# Patient Record
Sex: Female | Born: 1965 | Race: White | Hispanic: No | Marital: Married | Smoking: Never smoker
Health system: Southern US, Community
[De-identification: ages and names within clinical notes are randomized; demographics above are authoritative.]

## PROBLEM LIST (undated history)

## (undated) DIAGNOSIS — M4310 Spondylolisthesis, site unspecified: Secondary | ICD-10-CM

## (undated) DIAGNOSIS — R569 Unspecified convulsions: Secondary | ICD-10-CM

## (undated) DIAGNOSIS — T8859XA Other complications of anesthesia, initial encounter: Secondary | ICD-10-CM

## (undated) DIAGNOSIS — N6310 Unspecified lump in the right breast, unspecified quadrant: Secondary | ICD-10-CM

## (undated) DIAGNOSIS — T4145XA Adverse effect of unspecified anesthetic, initial encounter: Secondary | ICD-10-CM

## (undated) DIAGNOSIS — K219 Gastro-esophageal reflux disease without esophagitis: Secondary | ICD-10-CM

## (undated) DIAGNOSIS — T7840XA Allergy, unspecified, initial encounter: Secondary | ICD-10-CM

## (undated) HISTORY — DX: Spondylolisthesis, site unspecified: M43.10

## (undated) HISTORY — PX: EYE SURGERY: SHX253

## (undated) HISTORY — PX: ABDOMINAL SACROCOLPOPEXY: SHX1114

## (undated) HISTORY — DX: Allergy, unspecified, initial encounter: T78.40XA

## (undated) HISTORY — PX: URETER SURGERY: SHX823

## (undated) HISTORY — DX: Unspecified convulsions: R56.9

---

## 1984-07-16 HISTORY — PX: BUNIONECTOMY: SHX129

## 1998-03-04 ENCOUNTER — Other Ambulatory Visit: Admission: RE | Admit: 1998-03-04 | Discharge: 1998-03-04 | Payer: Self-pay | Admitting: *Deleted

## 1999-02-27 ENCOUNTER — Other Ambulatory Visit: Admission: RE | Admit: 1999-02-27 | Discharge: 1999-02-27 | Payer: Self-pay | Admitting: *Deleted

## 2000-04-25 ENCOUNTER — Other Ambulatory Visit: Admission: RE | Admit: 2000-04-25 | Discharge: 2000-04-25 | Payer: Self-pay | Admitting: Obstetrics and Gynecology

## 2000-05-14 ENCOUNTER — Encounter: Payer: Self-pay | Admitting: Obstetrics and Gynecology

## 2000-05-14 ENCOUNTER — Encounter: Admission: RE | Admit: 2000-05-14 | Discharge: 2000-05-14 | Payer: Self-pay | Admitting: Gynecology

## 2001-04-28 ENCOUNTER — Other Ambulatory Visit: Admission: RE | Admit: 2001-04-28 | Discharge: 2001-04-28 | Payer: Self-pay | Admitting: Obstetrics and Gynecology

## 2002-05-07 ENCOUNTER — Other Ambulatory Visit: Admission: RE | Admit: 2002-05-07 | Discharge: 2002-05-07 | Payer: Self-pay | Admitting: Obstetrics and Gynecology

## 2002-09-04 ENCOUNTER — Encounter: Payer: Self-pay | Admitting: Family Medicine

## 2002-09-04 ENCOUNTER — Encounter: Admission: RE | Admit: 2002-09-04 | Discharge: 2002-09-04 | Payer: Self-pay | Admitting: Family Medicine

## 2003-05-02 ENCOUNTER — Inpatient Hospital Stay (HOSPITAL_COMMUNITY): Admission: AD | Admit: 2003-05-02 | Discharge: 2003-05-04 | Payer: Self-pay | Admitting: Obstetrics and Gynecology

## 2003-06-14 ENCOUNTER — Other Ambulatory Visit: Admission: RE | Admit: 2003-06-14 | Discharge: 2003-06-14 | Payer: Self-pay | Admitting: Obstetrics and Gynecology

## 2003-07-30 ENCOUNTER — Observation Stay (HOSPITAL_COMMUNITY): Admission: RE | Admit: 2003-07-30 | Discharge: 2003-07-31 | Payer: Self-pay | Admitting: Obstetrics and Gynecology

## 2003-07-30 ENCOUNTER — Encounter (INDEPENDENT_AMBULATORY_CARE_PROVIDER_SITE_OTHER): Payer: Self-pay | Admitting: Specialist

## 2004-07-16 HISTORY — PX: ABDOMINAL HYSTERECTOMY: SHX81

## 2007-05-15 ENCOUNTER — Encounter: Payer: Self-pay | Admitting: Sports Medicine

## 2007-07-16 ENCOUNTER — Encounter: Admission: RE | Admit: 2007-07-16 | Discharge: 2007-07-16 | Payer: Self-pay | Admitting: Obstetrics and Gynecology

## 2007-07-28 ENCOUNTER — Encounter (INDEPENDENT_AMBULATORY_CARE_PROVIDER_SITE_OTHER): Payer: Self-pay | Admitting: *Deleted

## 2007-07-30 ENCOUNTER — Ambulatory Visit: Payer: Self-pay | Admitting: Sports Medicine

## 2007-07-30 DIAGNOSIS — M7750 Other enthesopathy of unspecified foot: Secondary | ICD-10-CM | POA: Insufficient documentation

## 2008-07-19 ENCOUNTER — Encounter: Admission: RE | Admit: 2008-07-19 | Discharge: 2008-07-19 | Payer: Self-pay | Admitting: Family Medicine

## 2009-07-25 ENCOUNTER — Encounter: Admission: RE | Admit: 2009-07-25 | Discharge: 2009-07-25 | Payer: Self-pay | Admitting: Obstetrics and Gynecology

## 2010-07-16 HISTORY — PX: ROTATOR CUFF REPAIR: SHX139

## 2010-08-04 ENCOUNTER — Encounter
Admission: RE | Admit: 2010-08-04 | Discharge: 2010-08-04 | Payer: Self-pay | Source: Home / Self Care | Attending: Obstetrics and Gynecology | Admitting: Obstetrics and Gynecology

## 2010-12-01 NOTE — H&P (Signed)
[name], [name]:  [id number]   MEDICAL RECORD NO.:  [id number]                   PATIENT TYPE:  INP   LOCATION:  9163                                 FACILITY:  [facility]   PHYSICIAN:  Lenoard Aden, M.D.             DATE OF BIRTH:  1965-07-25   DATE OF ADMISSION:  05/02/2003  DATE OF DISCHARGE:                                HISTORY & PHYSICAL   CHIEF COMPLAINT:  Spontaneous rupture of membranes at 11:30 p.m. on May 01, 2003, clear fluid.   HISTORY OF PRESENT ILLNESS:  The patient is a 45 year old white female, G1,  P0, estimated date of confinement of May 15, 2003, at 38 plus weeks who  presents with spontaneous rupture of membranes.   ALLERGIES:  SULFA AND LATEX ALLERGY.   MEDICATIONS:  Prenatal vitamins.   MEDICAL HISTORY:  1. 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  2. History of microscopic hematuria.  3. History of bunion removal.  4. History of recurrent urinary tract infections.  5. Remote history of seizure disorder.   FAMILY HISTORY:  Emphysema, smoking abuse, gestational diabetes, neural tube  defects, hypertension, myocardial infarction.   SOCIAL HISTORY:  Noncontributory.   LABORATORY DATA:  Prenatal laboratory data, blood type O positive, Rh  antibody negative, rubella immune, hepatitis and HIV negative.   HISTORY OF PRESENT PREGNANCY:  Her prenatal course was complicated by  advanced  maternal age with an amniocentesis consistent with a normal  female, preterm cervical change which was stable on serial examination.   PHYSICAL EXAMINATION:  GENERAL:  She is a well developed, well nourished  white female in no acute distress.  HEENT:  Normal.  LUNGS:  Clear.  HEART:  Regular rate and rhythm.  ABDOMEN:  Soft, gravid, nontender. Recent ultrasound guided estimate of  approximately 7-1/2 pounds.  PELVIC:   Cervix is 1 cm thick, vertex, -2.  EXTREMITIES:  No cords.  NEUROLOGIC:  Examination is nonfocal.   IMPRESSION:  1. A 38 week intrauterine pregnancy.  2. Spontaneous rupture of membranes at term.  3. History of ureteral reimplantation.  4. Remote history of seizure disorder on early preconceptual folic acid     supplementation.  5. Advanced maternal age with normal amniocentesis.   PLAN:  Proceed with admission. Pitocin augmentation as needed. Fetal heart  rate monitoring per usual protocol. Anticipate attempts at vaginal delivery  due to a narrow pubic arch and questionable borderline pelvimetry. Will  proceed with cautious attempts at operative delivery pending the patient's  ability to push.  Lenoard Aden, M.D.    RJT/MEDQ  D:  05/02/2003  T:  05/02/2003  Job:  [id number]

## 2010-12-01 NOTE — Op Note (Signed)
   NAMEROSAURA, [name]:  [id number]   MEDICAL RECORD NO.:  [id number]                   PATIENT TYPE:  INP   LOCATION:  9104                                 FACILITY:  [facility]   PHYSICIAN:  Lenoard Aden, M.D.             DATE OF BIRTH:  03-26-1966   DATE OF PROCEDURE:  DATE OF DISCHARGE:                                 OPERATIVE REPORT   INDICATION FOR OPERATIVE DELIVERY:  Nonreassuring fetal heart rate status.   POSTOPERATIVE DIAGNOSIS:  Nonreassuring fetal heart rate status.   PROCEDURE:  Outlet vacuum-assisted vaginal delivery.   SURGEON:  Lenoard Aden, M.D.   ANESTHESIA:  Epidural.   ESTIMATED BLOOD LOSS:  600 cc.   COMPLICATIONS:  None.   BRIEF OPERATIVE NOTE:  After being apprised of risks and benefit of vacuum-  assistance including small incidence of cephal hematoma, scalp lacerations,  and/or intracranial hemorrhage, the patient is offered Kiwi cup assistance.  Risks and benefits having been discussed, husband and patient agree to  proceed.  Due to nonreassuring heart rate status, fetal vertex is noted to  be LOA, less than 45 degrees, +3 to +4 station.  Kiwi cup is placed for two  pulls over a first-degree midline laceration and a right lateral vaginal  wall laceration.  Full-term living female, Apgars 8 and 9, cord blood  collected, bulb suctioning performed, spontaneously intact three-vessel cord  noted, cervix without lacerations, no other vaginal lacerations noted.  The  right lateral wall laceration is repaired using a running 3-0 Vicryl repeat.  The small superficial first-degree laceration repaired using a 3-0 repeat.  Good hemostasis is noted.  All counts are correct.  The patient tolerates  the procedure well and is in the recovery room in good condition.                                               Lenoard Aden, M.D.    RJT/MEDQ  D:  05/02/2003  T:  05/02/2003  Job:  [id number]

## 2010-12-01 NOTE — H&P (Signed)
NAME:  [name], [name]                       ACCOUNT NO.:  [id number]   MEDICAL RECORD NO.:  [id number]                   PATIENT TYPE:  OBV   LOCATION:  NA                                   [facility]   PHYSICIAN:  Lenoard Aden, M.D.             DATE OF BIRTH:  Mar 01, 1966   DATE OF ADMISSION:  DATE OF DISCHARGE:                                HISTORY & PHYSICAL   ANTICIPATED DATE OF ADMISSION:  July 30, 2003.   CHIEF COMPLAINT:  Symptomatic pelvic relaxation, rectocele and perineal  relaxation.   HISTORY OF PRESENT ILLNESS:  The patient is a 45 year old white female,  G1,  P1, status post uncomplicated vaginal delivery in October 2004 who presents  with worsening pelvic relaxation and inability to defecate without placing  her ___________ rectum.   MEDICATIONS:  None.   ALLERGIES:  Allergies are to SULFA and LATEX.   PAST MEDICAL HISTORY:  1. The patient has a history of childhood bladder anomaly with the need for     exploratory laparotomy and reimplantation of a ureters.  2. History of microscopic hematuria.  3. History of bunion removal.  4. History of recurrent UTI.  5. History of seizure disorder.   FAMILY HISTORY:  Family history is remarkable for emphysema, smoking abuse,  gestational diabetes, ___________ defects, hypertension, and myocardial  infarction.   SOCIAL HISTORY:  Social history is noncontributory.   PHYSICAL EXAMINATION:  GENERAL APPEARANCE:  On physical exam she is a well-  developed, well-nourished, white female in no acute distress.  HEENT:  Normal.  LUNGS:  Lungs are clear.  HEART:  Regular rhythm.  ABDOMEN:  Abdomen is soft and nontender.  PELVIC EXAMINATION:  Uterus is small and anteflexed.  No adnexal masses are  noted.  There is a perineal defect with perineal relaxation and a Grade 2  rectocele noted.  EXTREMITIES:  There are no cords.  NEUROLOGIC:  Neurologic exam is nonfocal.   IMPRESSION:  1. Symptomatic rectocele.  2.  Perineal relaxation.   PLAN:  The plan is for posterior colporrhaphy and perineorrhaphy.   The risks of anesthesia, infection, bleeding, and injury to abdominal organs  and need for repair were discussed, and delayed versus immediate  complications to include bowel injury were noted.  The patient acknowledges  and wishes to proceed.                                               Lenoard Aden, M.D.    RJT/MEDQ  D:  07/29/2003  T:  07/29/2003  Job:  [id number]

## 2010-12-01 NOTE — Op Note (Signed)
NAME:  [name], [name]                       ACCOUNT NO.:  [id number]   MEDICAL RECORD NO.:  [id number]                   PATIENT TYPE:  OBV   LOCATION:  9312                                 [facility]ACILITY:  [facility]   PHYSICIAN:  Lenoard Aden, M.D.             DATE OF BIRTH:  July 07, 1966   DATE OF PROCEDURE:  07/30/2003  DATE OF DISCHARGE:                                 OPERATIVE REPORT   PREOPERATIVE DIAGNOSES:  1. Symptomatic rectocele.  2. Perineal relaxation.  3. Vulvodynia.   POSTOPERATIVE DIAGNOSES:  1. Symptomatic rectocele.  2. Perineal relaxation.  3. Vulvodynia.  4. Right vaginal wall implant.   PROCEDURES:  1. Posterior colporrhaphy.  2. Perineorrhaphy.  3. Excision of right vaginal wall implant.   SURGEON:  Lenoard Aden, M.D.   ANESTHESIA:  General.   ESTIMATED BLOOD LOSS:  Less than 50 mL.   COMPLICATIONS:  None.   DRAINS:  None.   COUNTS:  Correct.   Patient to recovery in good condition.   BRIEF OPERATIVE NOTE:  After being apprised of the risks of anesthesia,  infection, bleeding, possibly injury to bowel and need for repair, inability  to cure pain, the patient is brought to the operating room, where she was  administered a general anesthetic without complications, prepped and draped  in the usual sterile fashion, and a Foley catheter placed.  After achieving  adequate anesthesia, dilute Pitressin solution placed in the area of the  rectocele repair, perineum, and an identified right vaginal wall implant,  which is causing scarring along the right lateral vaginal wall.  The right  vaginal wall implant is elevated and circumscribed using electrocautery and  removed at the base and sent to pathology.  Full removal of the implant was  noted.  Subcutaneous and vaginal wall tissue was closed using a 3-0 Vicryl.  At this time a perineal incision is made excising an area of skin along the  perineum.  The rectocele is marked with a rectal exam noting  the apex of the  rectocele defect.  It is then circumscribed using electrocautery to define  the vaginal wall mucosa to be removed.  This vaginal wall mucosa is then  undermined using Strully scissors and excised in a rectangular strand of  tissue.  The rectovaginal tissue is then plicated in the midline,  imbricating the defect using a 3-0 Monocryl suture in interrupted fashion  down to the level of the perineum, whereby the perineal relaxation is  corrected, performing a perineorrhaphy.  Deep stitches are placed using a 3-  0 Monocryl and the perineorrhaphy is then closed in the standard fashion  using a 3-0 Vicryl suture.  Rectal exam shows the rectum to be intact and no  invasion of any suture material into the rectal area.  The patient tolerates  the procedure well, is awakened and transferred to recovery in good  condition.  Lenoard Aden, M.D.    RJT/MEDQ  D:  07/30/2003  T:  07/30/2003  Job:  [id number]

## 2011-07-18 ENCOUNTER — Other Ambulatory Visit: Payer: Self-pay | Admitting: Obstetrics and Gynecology

## 2011-07-18 DIAGNOSIS — Z1231 Encounter for screening mammogram for malignant neoplasm of breast: Secondary | ICD-10-CM

## 2011-08-06 ENCOUNTER — Ambulatory Visit
Admission: RE | Admit: 2011-08-06 | Discharge: 2011-08-06 | Disposition: A | Discharge status: Home / Self Care | Payer: BC Managed Care – PPO | Source: Ambulatory Visit | Attending: Obstetrics and Gynecology | Admitting: Obstetrics and Gynecology

## 2011-08-06 DIAGNOSIS — Z1231 Encounter for screening mammogram for malignant neoplasm of breast: Secondary | ICD-10-CM

## 2011-08-07 ENCOUNTER — Other Ambulatory Visit: Payer: Self-pay | Admitting: Obstetrics and Gynecology

## 2011-08-07 DIAGNOSIS — N6311 Unspecified lump in the right breast, upper outer quadrant: Secondary | ICD-10-CM

## 2011-08-15 ENCOUNTER — Ambulatory Visit
Admission: RE | Admit: 2011-08-15 | Discharge: 2011-08-15 | Disposition: A | Discharge status: Home / Self Care | Payer: BC Managed Care – PPO | Source: Ambulatory Visit | Attending: Obstetrics and Gynecology | Admitting: Obstetrics and Gynecology

## 2011-08-15 DIAGNOSIS — N6311 Unspecified lump in the right breast, upper outer quadrant: Secondary | ICD-10-CM

## 2011-11-01 ENCOUNTER — Other Ambulatory Visit: Payer: Self-pay | Admitting: Gastroenterology

## 2011-11-05 ENCOUNTER — Other Ambulatory Visit: Payer: BC Managed Care – PPO

## 2011-12-24 ENCOUNTER — Ambulatory Visit
Admission: RE | Admit: 2011-12-24 | Discharge: 2011-12-24 | Disposition: A | Discharge status: Home / Self Care | Payer: BC Managed Care – PPO | Source: Ambulatory Visit | Attending: Gastroenterology | Admitting: Gastroenterology

## 2011-12-24 IMAGING — US US ABDOMEN COMPLETE
1 series · 14 of 25 positions shown · non-contrast
Comparison: No comparison studies available.

CLINICAL DATA: Abdominal pain

ABDOMEN ULTRASOUND
TECHNIQUE: Complete abdominal ultrasound examination was performed
including evaluation of the liver, gallbladder, bile ducts,
pancreas, kidneys, spleen, IVC, and abdominal aorta.

[Series 1: us abdomen complete · 0.20mm/px · 14 of 75 slices shown]
[im 1/75]
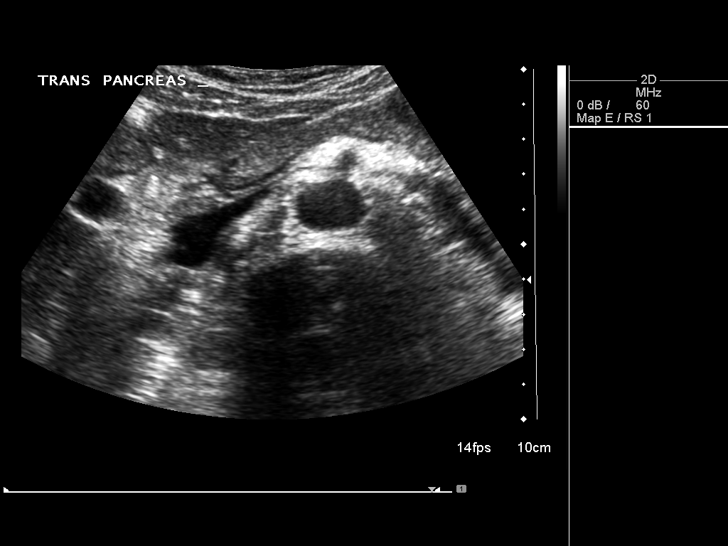
[im 7/75]
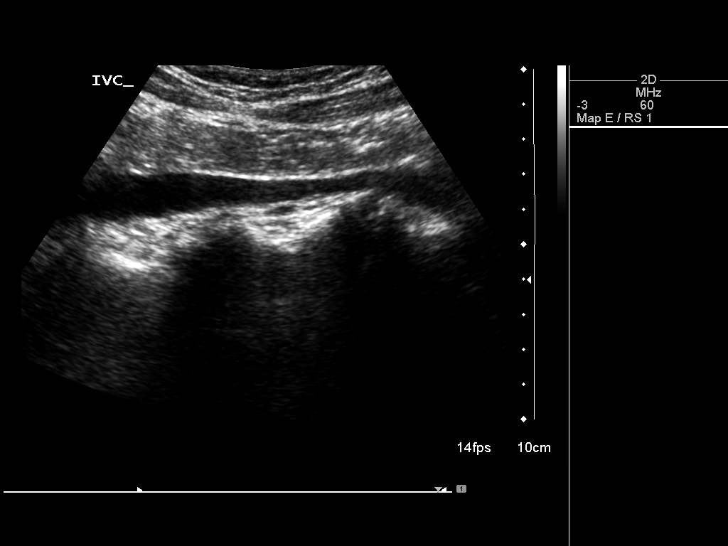
[im 13/75]
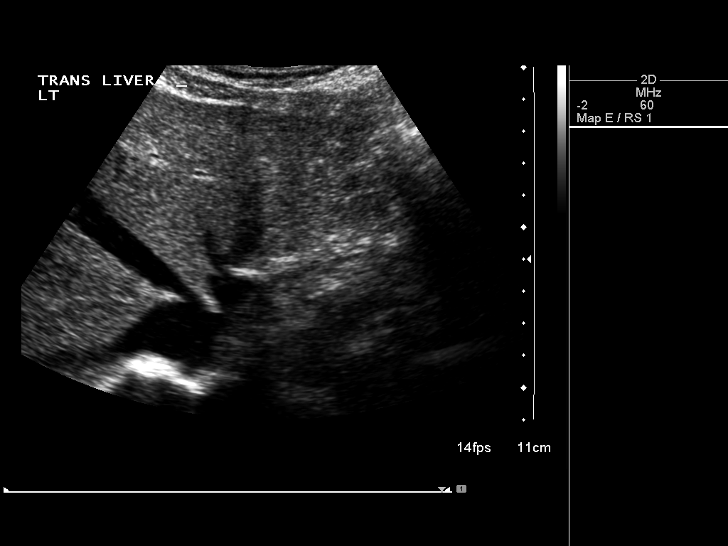
[im 19/75]
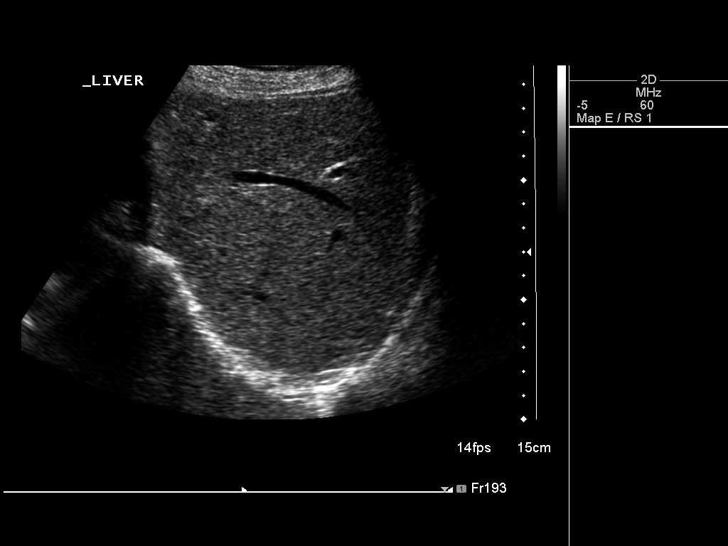
[im 25/75]
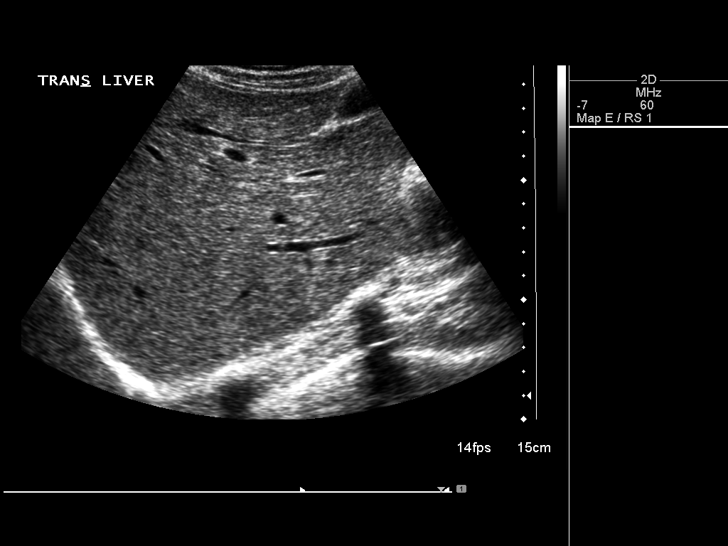
[im 28/75]
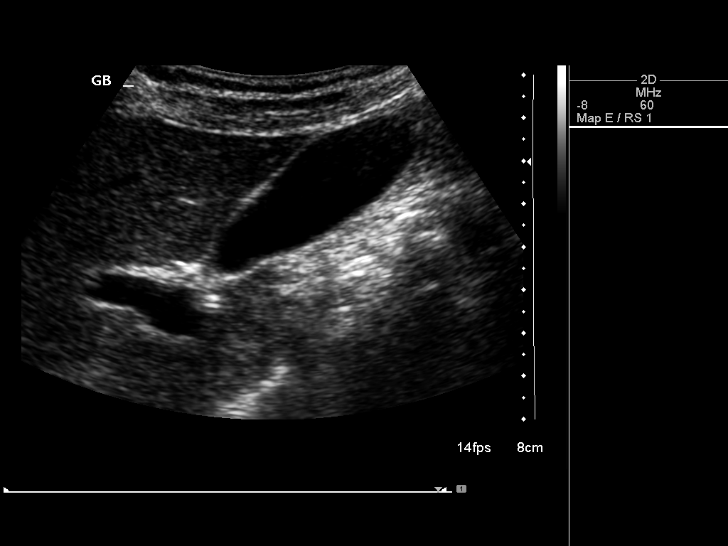
[im 34/75]
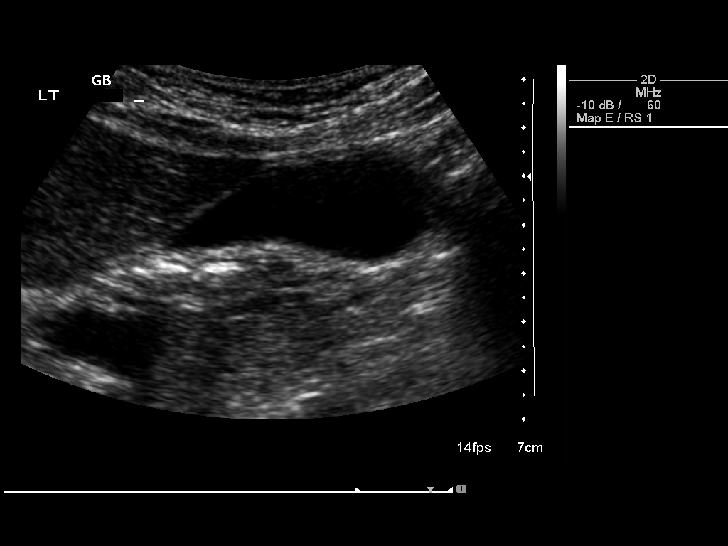
[im 41/75]
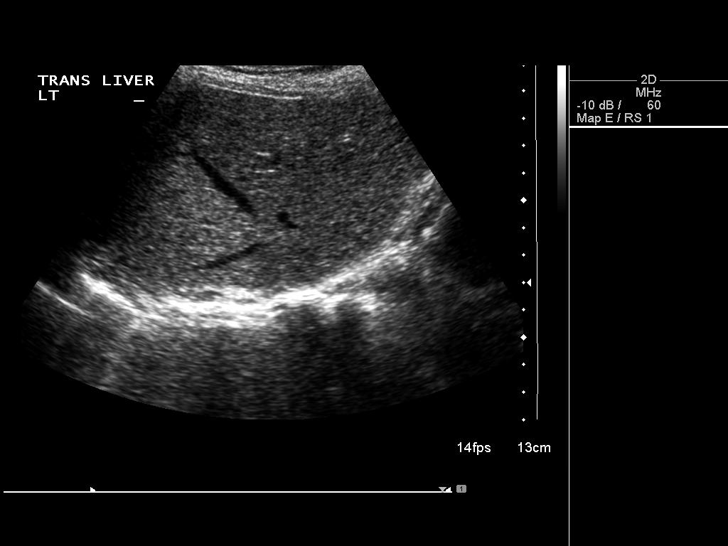
[im 47/75]
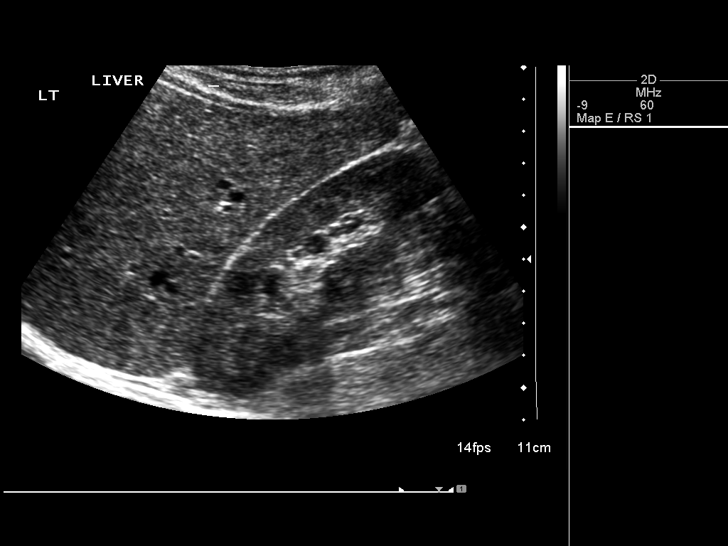
[im 50/75]
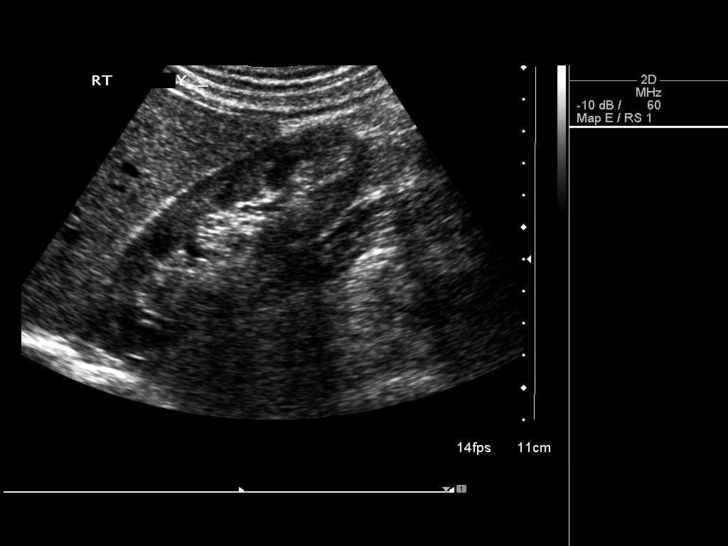
[im 56/75]
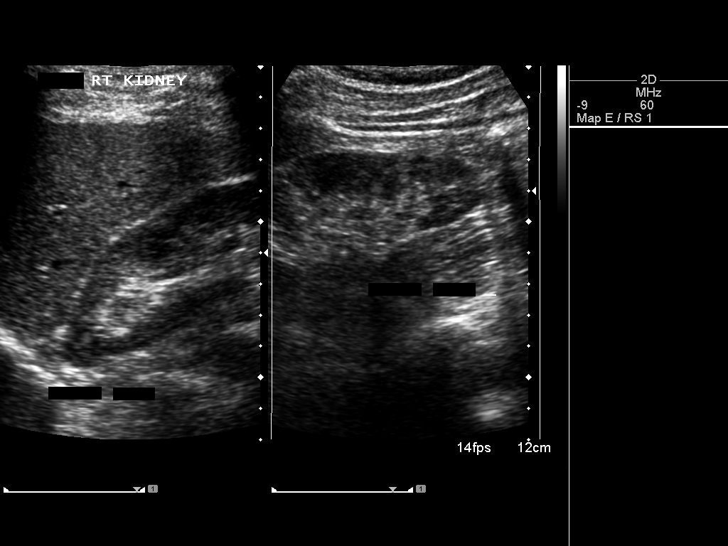
[im 62/75]
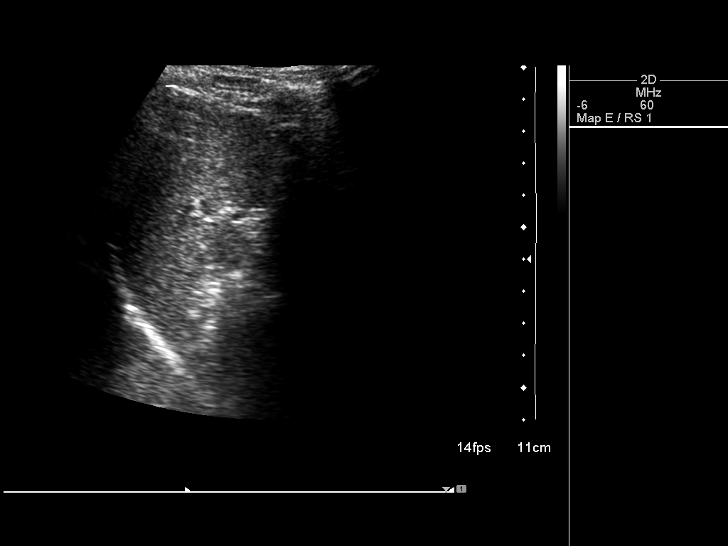
[im 68/75]
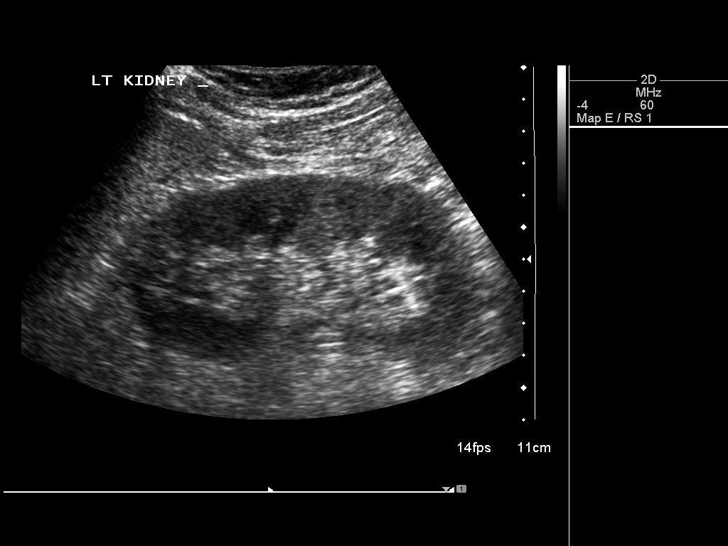
[im 75/75]
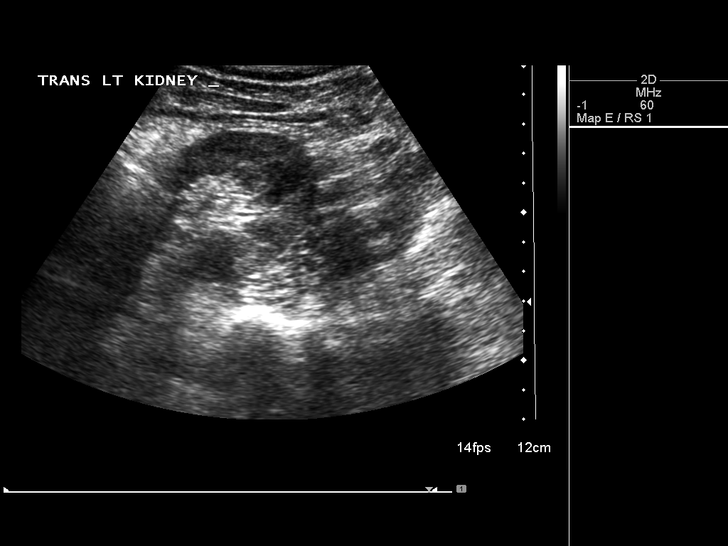

[14 of 25 positions shown; findings below may reference images not displayed]

FINDINGS: Gallbladder:  There is no evidence for gallstones.  No gallbladder
wall thickening or pericholecystic fluid.  The sonographer reports
no sonographic Murphy's sign.

Common Bile Duct:  Nondilated at 3 mm diameter.

Liver:  Tiny hepatic cyst noted.  Otherwise normal in appearance.
Normal echotexture.

IVC:  Normal.

Pancreas:  Normal.

Spleen:  Normal.

Right kidney:  10.8 cm in long axis.  Normal.

Left kidney:  11.2 cm in long axis.  Normal.

Abdominal Aorta:  No aneurysm.
IMPRESSION: Tiny hepatic cyst.  Otherwise normal exam.

## 2012-03-21 ENCOUNTER — Other Ambulatory Visit: Payer: Self-pay | Admitting: Dermatology

## 2012-07-17 ENCOUNTER — Other Ambulatory Visit: Payer: Self-pay | Admitting: Obstetrics and Gynecology

## 2012-07-17 DIAGNOSIS — Z1231 Encounter for screening mammogram for malignant neoplasm of breast: Secondary | ICD-10-CM

## 2012-08-15 ENCOUNTER — Ambulatory Visit
Admission: RE | Admit: 2012-08-15 | Discharge: 2012-08-15 | Disposition: A | Discharge status: Home / Self Care | Payer: 59 | Source: Ambulatory Visit | Attending: Obstetrics and Gynecology | Admitting: Obstetrics and Gynecology

## 2012-08-15 DIAGNOSIS — Z1231 Encounter for screening mammogram for malignant neoplasm of breast: Secondary | ICD-10-CM

## 2012-08-15 IMAGING — MG MM DIGITAL SCREENING BILAT W/ CAD
8 series · 9 of 24 positions shown · non-contrast
Comparison: [DATE]

CLINICAL DATA: Screening.

DIGITAL BILATERAL SCREENING MAMMOGRAM WITH CAD
 DIGITAL BREAST TOMOSYNTHESIS
Digital breast tomosynthesis images are acquired in two
projections.  These images are reviewed in combination with the
digital mammogram, confirming the findings below.

[L CC]
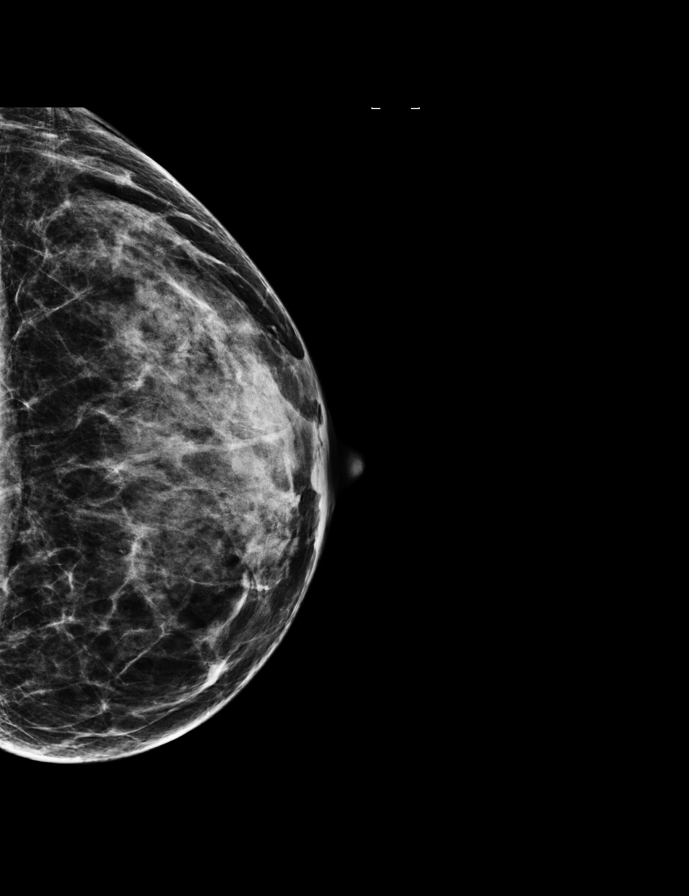

[R CC]
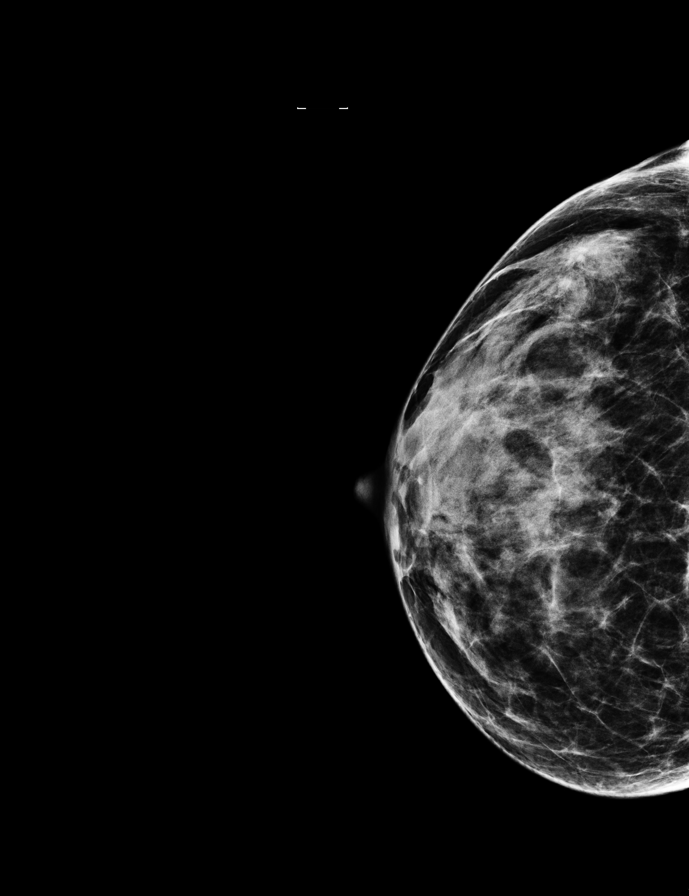

[R MLO]
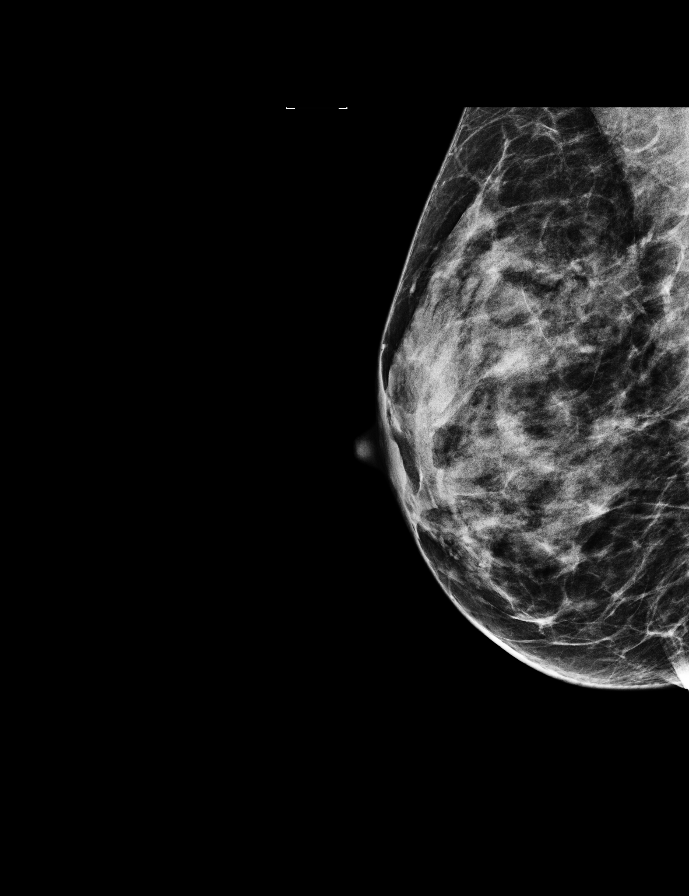

[L MLO]
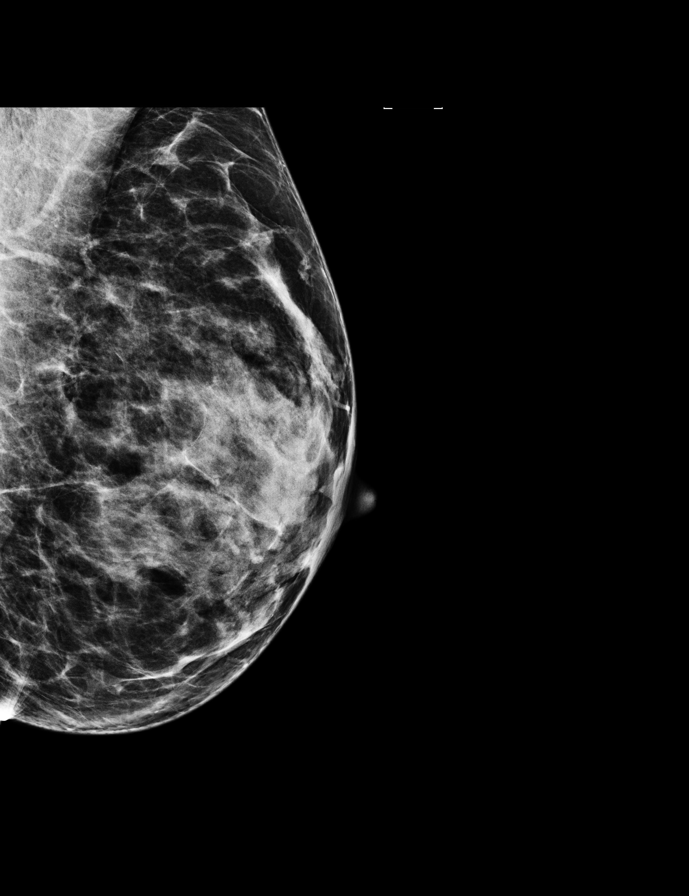

[L MLO tomo · 2 of 51 frames shown]
[frame 17/51]
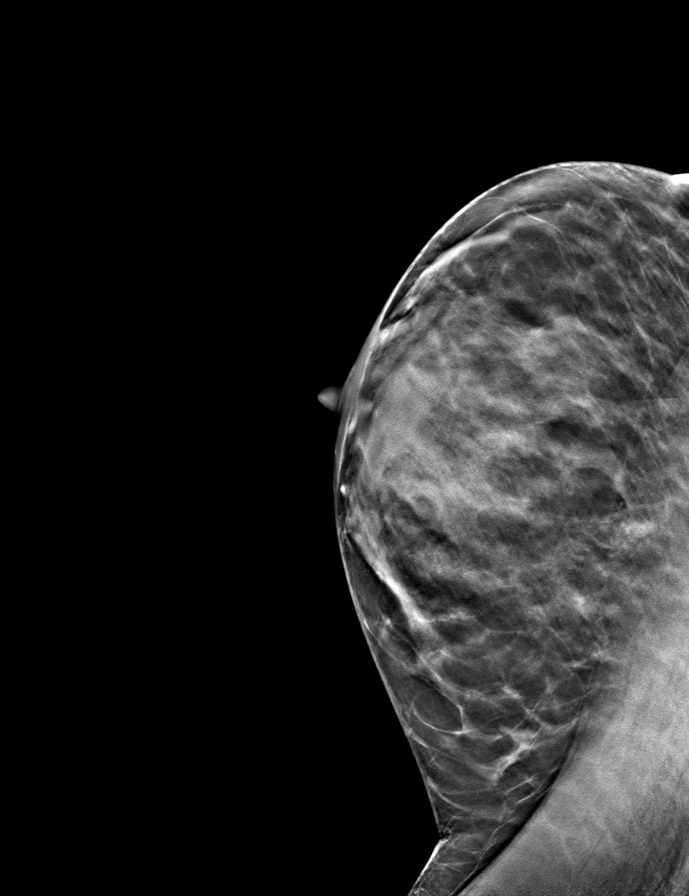
[frame 26/51]
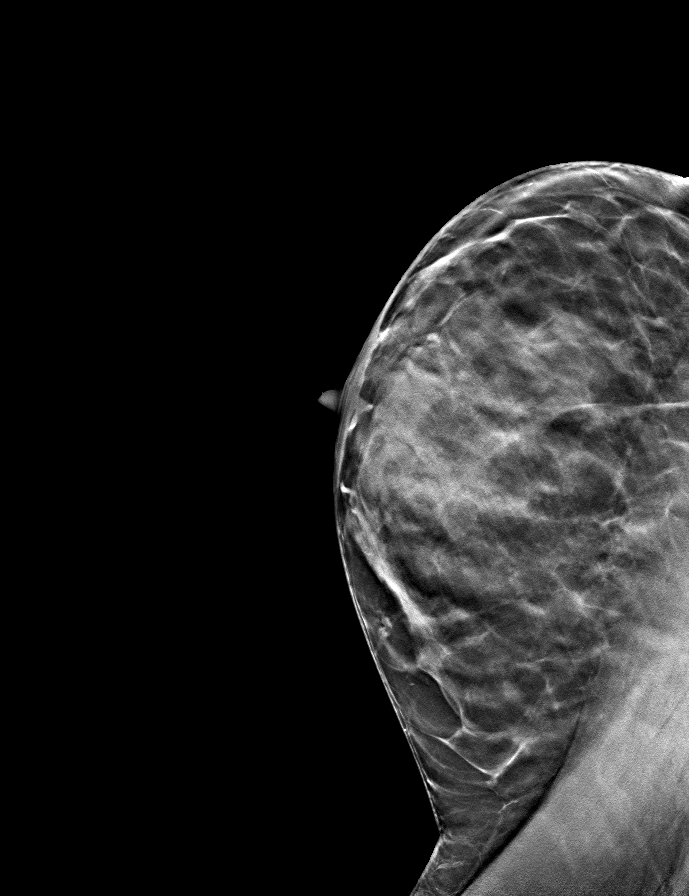

[R MLO tomo · tomo slice 24/47.0]
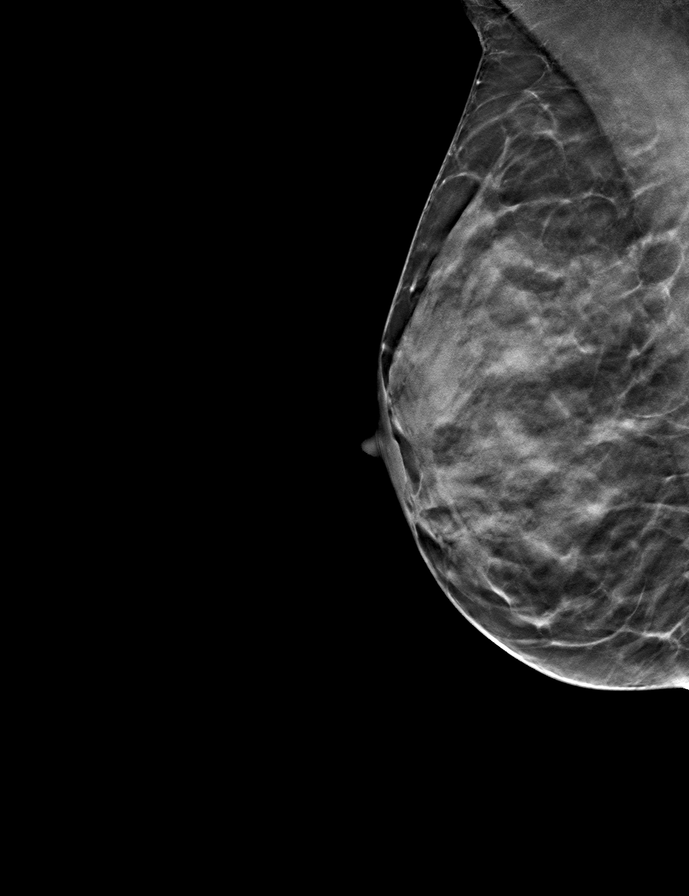

[L CC tomo · tomo slice 27/53.0]
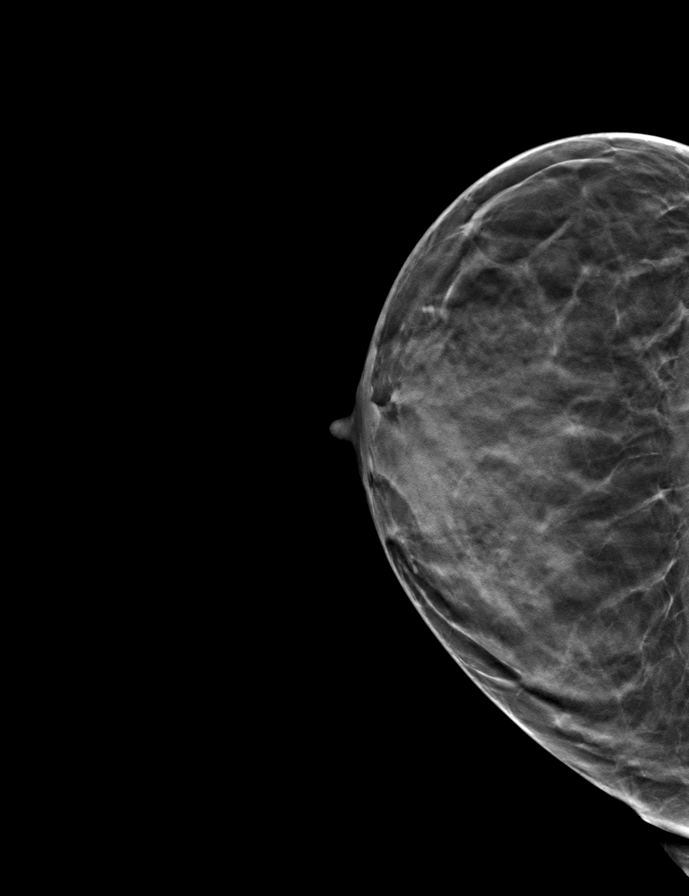

[R CC tomo · tomo slice 25/48.0]
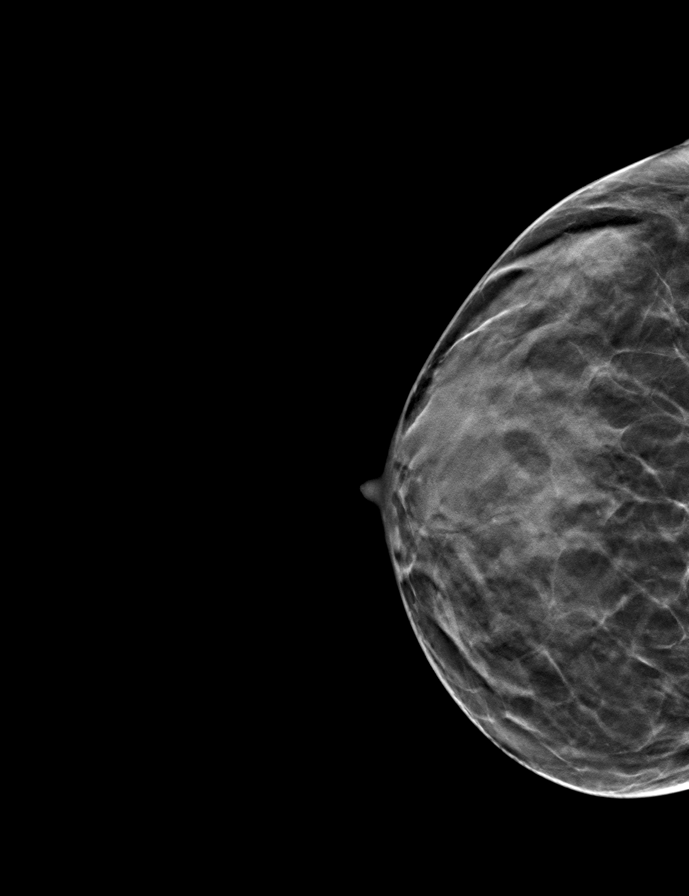

[9 of 24 positions shown; findings below may reference images not displayed]

FINDINGS: ACR Breast Density Category 3: The breast tissue is heterogeneously
dense.

There is a possible mass in the right breast.  Ultrasound is
recommended for further evaluation.  The left breast is
unremarkable.

Images were processed with CAD.
IMPRESSION: Possible mass, right breast. Ultrasound is recommended for further
evaluation.

RECOMMENDATION:

Ultrasound of the right breast. (Code:[DY])

BI-RADS CATEGORY 0:  Incomplete.  Need additional imaging
evaluation and/or prior mammograms for comparison.

## 2012-08-19 ENCOUNTER — Other Ambulatory Visit: Payer: Self-pay | Admitting: Obstetrics and Gynecology

## 2012-08-19 DIAGNOSIS — R928 Other abnormal and inconclusive findings on diagnostic imaging of breast: Secondary | ICD-10-CM

## 2012-08-25 ENCOUNTER — Other Ambulatory Visit: Payer: Self-pay | Admitting: Obstetrics and Gynecology

## 2012-08-25 ENCOUNTER — Ambulatory Visit
Admission: RE | Admit: 2012-08-25 | Discharge: 2012-08-25 | Disposition: A | Discharge status: Home / Self Care | Payer: 59 | Source: Ambulatory Visit | Attending: Obstetrics and Gynecology | Admitting: Obstetrics and Gynecology

## 2012-08-25 DIAGNOSIS — R928 Other abnormal and inconclusive findings on diagnostic imaging of breast: Secondary | ICD-10-CM

## 2012-08-25 IMAGING — US US BREAST*R*
1 series · 4 of 4 positions shown · non-contrast
Comparison: Screening mammogram/tomogram [DATE]

CLINICAL DATA: Possible mass in the outer right breast identified
on recent screening mammogram/tomogram.  The patient reports that a
palpable area in the 9 o'clock position of the right breast,
previously diagnosed as a fibrocystic complex on ultrasound,
remains the same.

DIGITAL DIAGNOSTIC RIGHT MAMMOGRAM WITHOUT CAD AND RIGHT BREAST
ULTRASOUND:

[Series 1: us breast*right* · 4 of 4 slices shown]
[im 1/4]
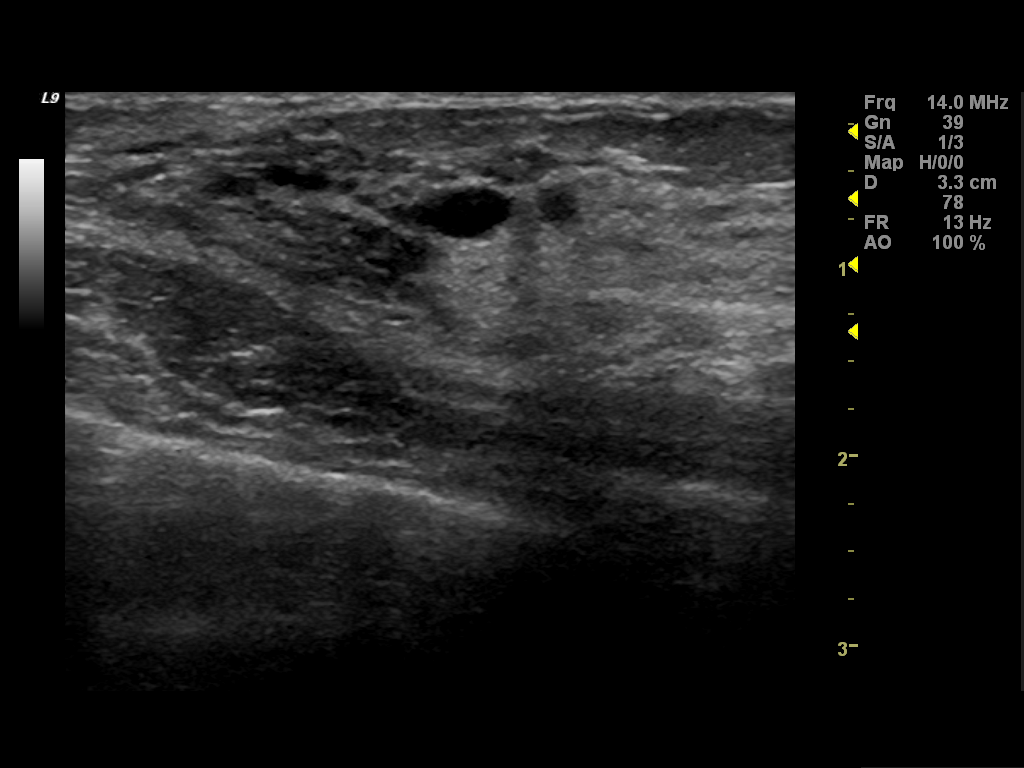
[im 2/4]
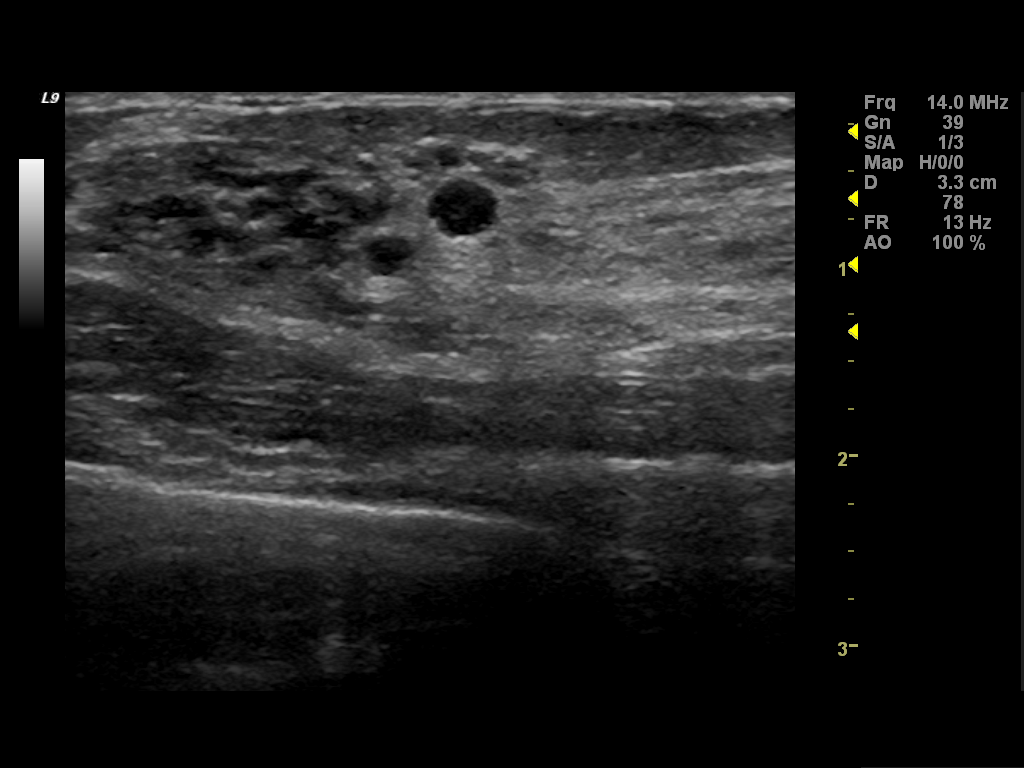
[im 3/4]
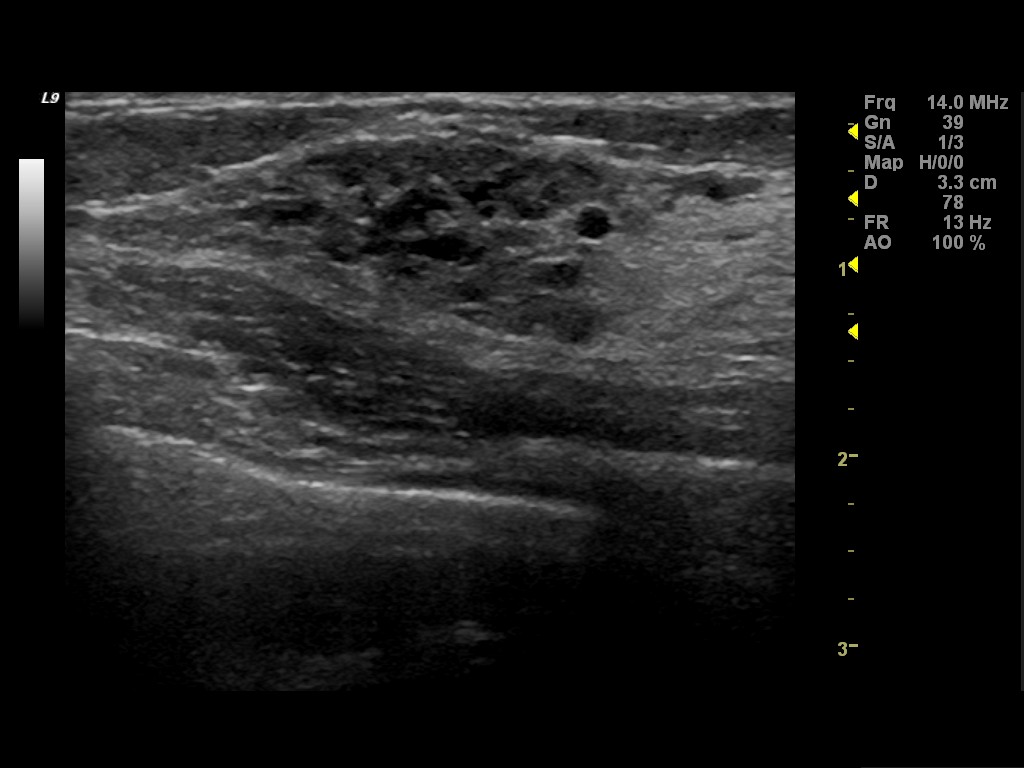
[im 4/4]
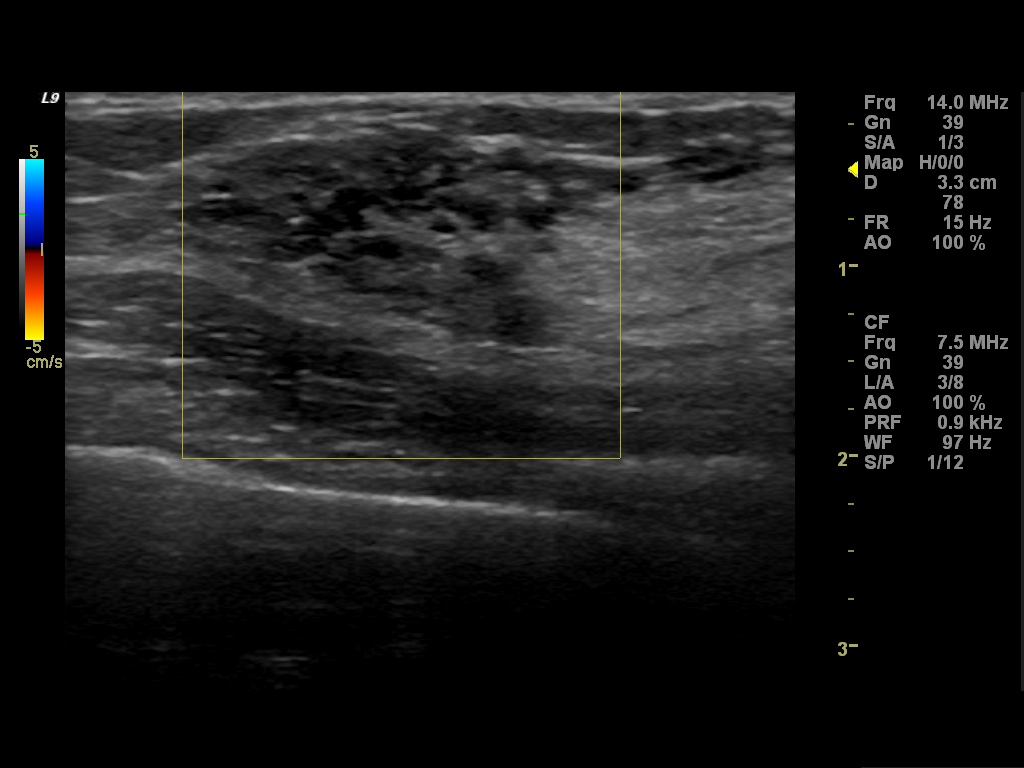

[4 of 4 positions shown; findings below may reference images not displayed]

FINDINGS: ACR Breast Density Category 3: The breast tissue is heterogeneously
dense.

Focal spot compression views of the outer right breast and rolled
CC views of the right breast show heterogeneously dense breast
parenchyma without a persistent mass or distortion.  When compared
to prior mammogram of [DATE], the area of concern on recent
screening mammogram in [DATE] is in a similar location as the
patient's palpable area, previously shown to be fibrocystic
changes.

On physical exam, there is mobile, palpable fullness in the 9
o'clock position of the right breast 5 cm from the nipple.

Ultrasound is performed, showing focal fibrocystic changes at 9
o'clock position 5 cm from the nipple with multiple small cysts.
The appearance is similar to the ultrasound of [DATE].  No
solid mass or abnormal shadowing is identified to suggest
malignancy in the outer right breast.
IMPRESSION: No evidence of malignancy in the right breast.  Stable appearance
of focal fibrocystic changes in the 9 o'clock position.

RECOMMENDATION:
Bilateral screening mammogram in 1 year.

I have discussed the findings and recommendations with the patient.
Results were also provided in writing at the conclusion of the
visit.

BI-RADS CATEGORY 2:  Benign finding(s).

## 2012-08-26 IMAGING — MG MM DIGITAL DIAGNOSTIC LIMITED*R*
4 series · 4 of 4 positions shown · non-contrast
Comparison: Screening mammogram/tomogram [DATE]

CLINICAL DATA: Possible mass in the outer right breast identified
on recent screening mammogram/tomogram.  The patient reports that a
palpable area in the 9 o'clock position of the right breast,
previously diagnosed as a fibrocystic complex on ultrasound,
remains the same.

DIGITAL DIAGNOSTIC RIGHT MAMMOGRAM WITHOUT CAD AND RIGHT BREAST
ULTRASOUND:

[R CC]
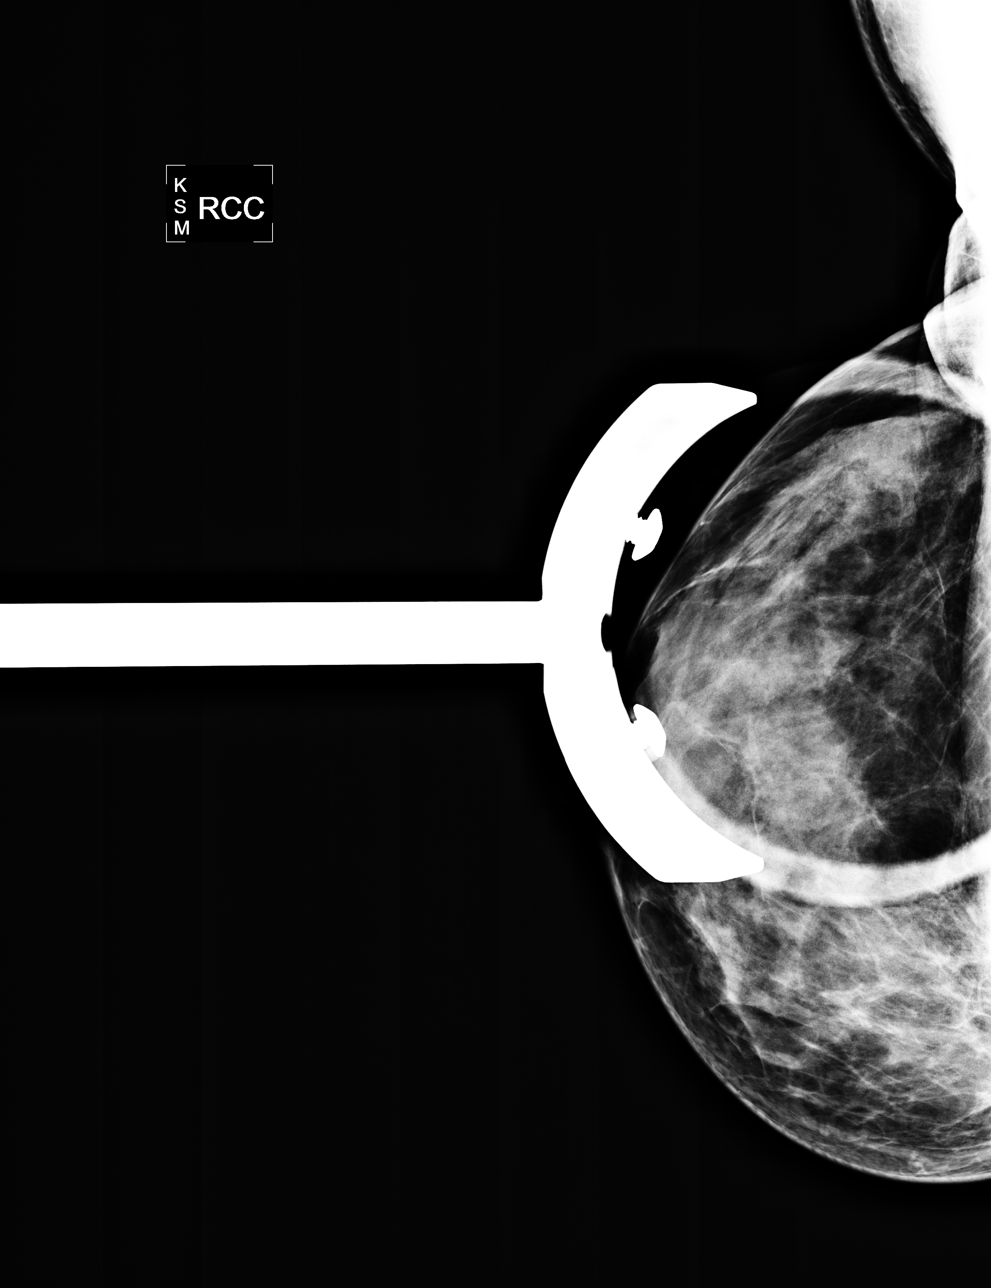

[R MLO]
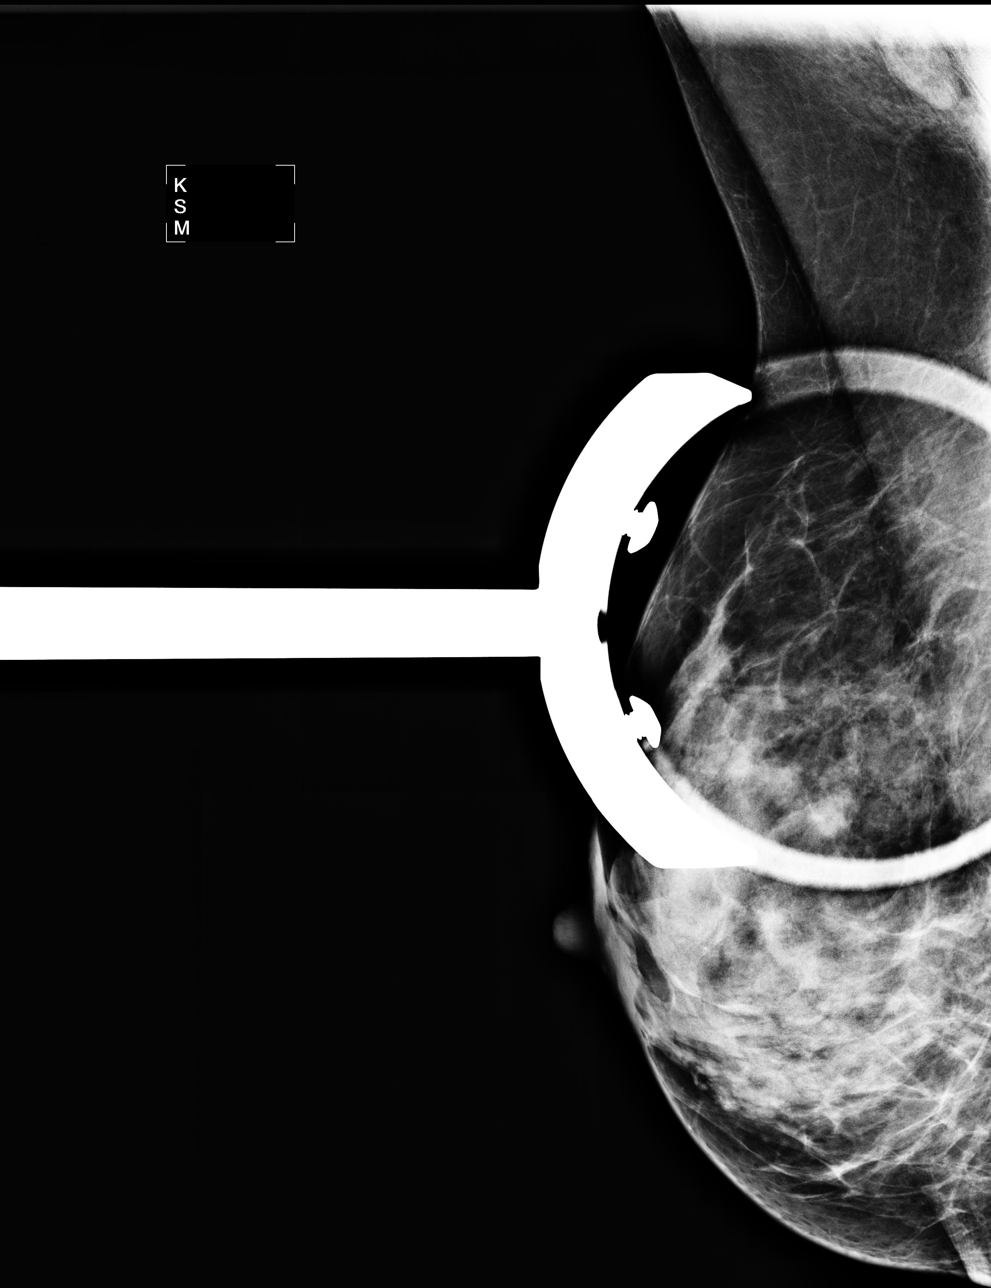

[R right lateral]
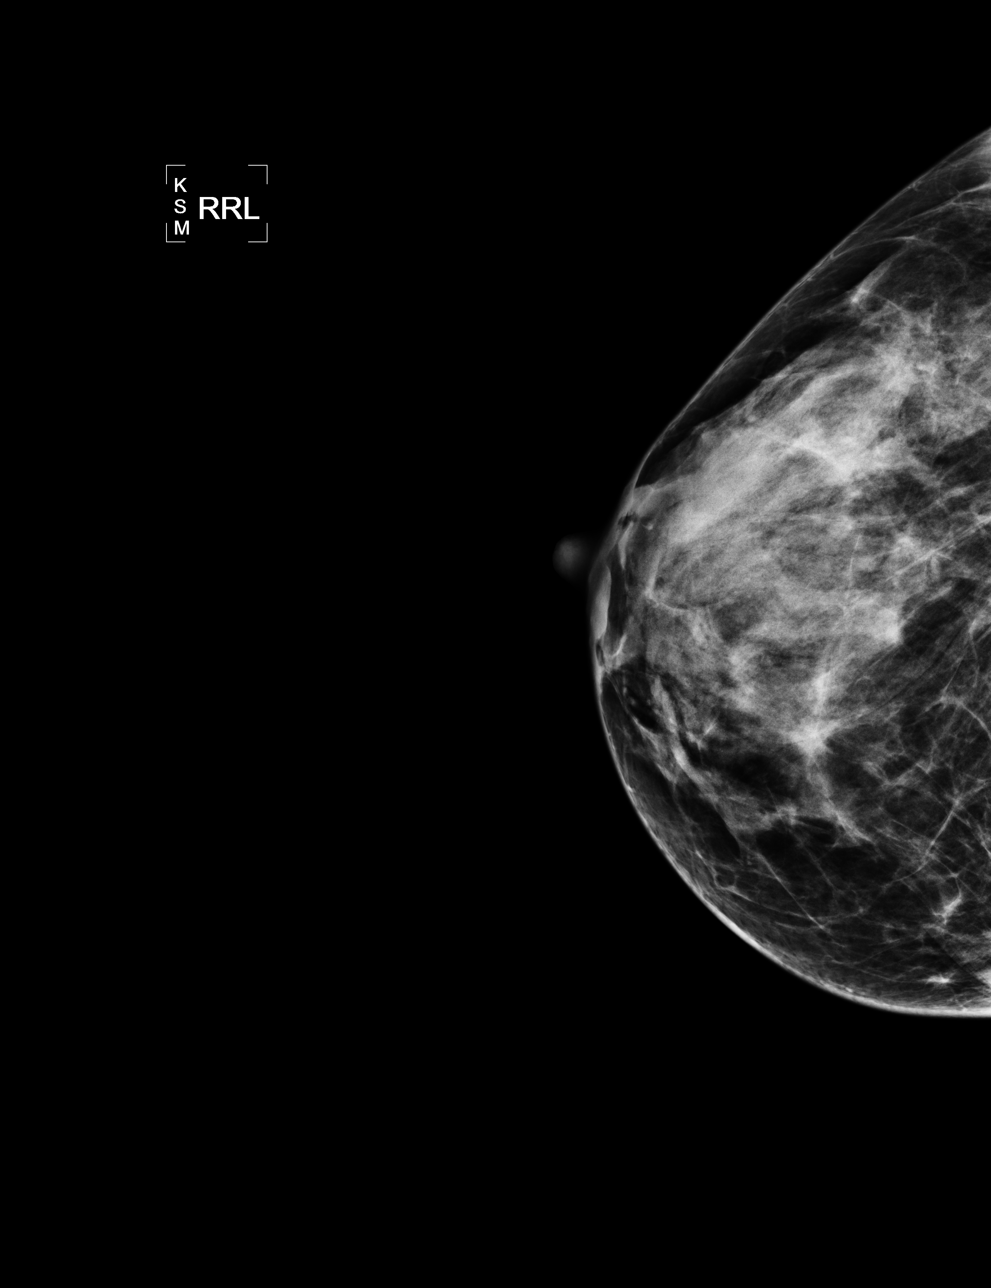

[R RM]
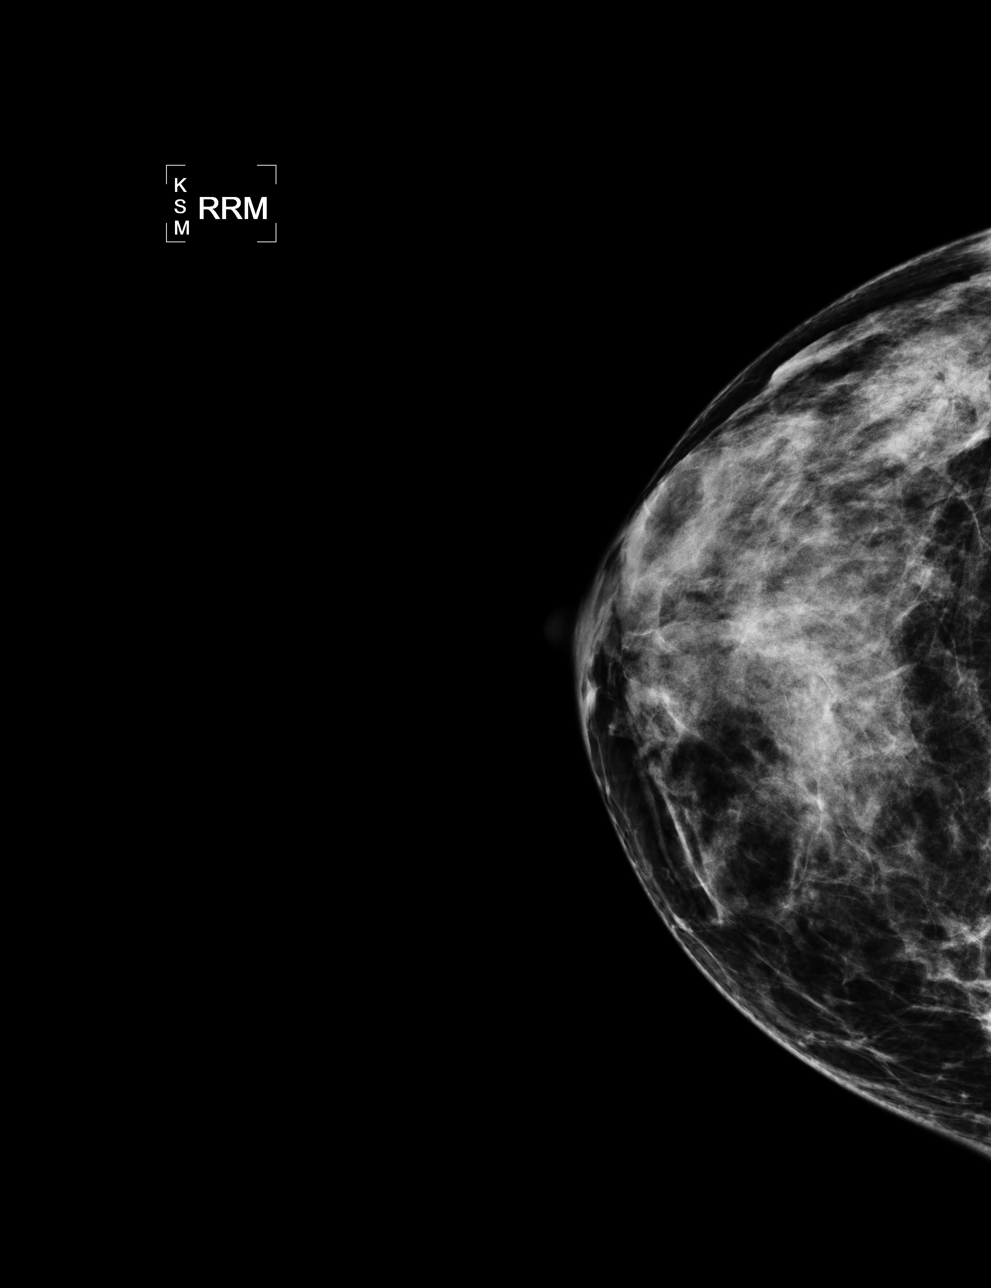

[4 of 4 positions shown; findings below may reference images not displayed]

FINDINGS: ACR Breast Density Category 3: The breast tissue is heterogeneously
dense.

Focal spot compression views of the outer right breast and rolled
CC views of the right breast show heterogeneously dense breast
parenchyma without a persistent mass or distortion.  When compared
to prior mammogram of [DATE], the area of concern on recent
screening mammogram in [DATE] is in a similar location as the
patient's palpable area, previously shown to be fibrocystic
changes.

On physical exam, there is mobile, palpable fullness in the 9
o'clock position of the right breast 5 cm from the nipple.

Ultrasound is performed, showing focal fibrocystic changes at 9
o'clock position 5 cm from the nipple with multiple small cysts.
The appearance is similar to the ultrasound of [DATE].  No
solid mass or abnormal shadowing is identified to suggest
malignancy in the outer right breast.
IMPRESSION: No evidence of malignancy in the right breast.  Stable appearance
of focal fibrocystic changes in the 9 o'clock position.

RECOMMENDATION:
Bilateral screening mammogram in 1 year.

I have discussed the findings and recommendations with the patient.
Results were also provided in writing at the conclusion of the
visit.

BI-RADS CATEGORY 2:  Benign finding(s).

## 2013-07-29 ENCOUNTER — Other Ambulatory Visit: Payer: Self-pay

## 2013-07-29 DIAGNOSIS — Z1231 Encounter for screening mammogram for malignant neoplasm of breast: Secondary | ICD-10-CM

## 2013-08-17 ENCOUNTER — Ambulatory Visit
Admission: RE | Admit: 2013-08-17 | Discharge: 2013-08-17 | Disposition: A | Discharge status: Home / Self Care | Payer: 59 | Source: Ambulatory Visit

## 2013-08-17 DIAGNOSIS — Z1231 Encounter for screening mammogram for malignant neoplasm of breast: Secondary | ICD-10-CM

## 2013-08-17 IMAGING — MG MM SCREENING BREAST TOMO BILATERAL
11 of 15 series · 11 of 35 positions shown · non-contrast
Comparison: Previous exam(s).

CLINICAL DATA: Screening.

EXAM:
DIGITAL SCREENING BILATERAL MAMMOGRAM WITH 3D TOMO WITH CAD

[R CC (1 of 2)]
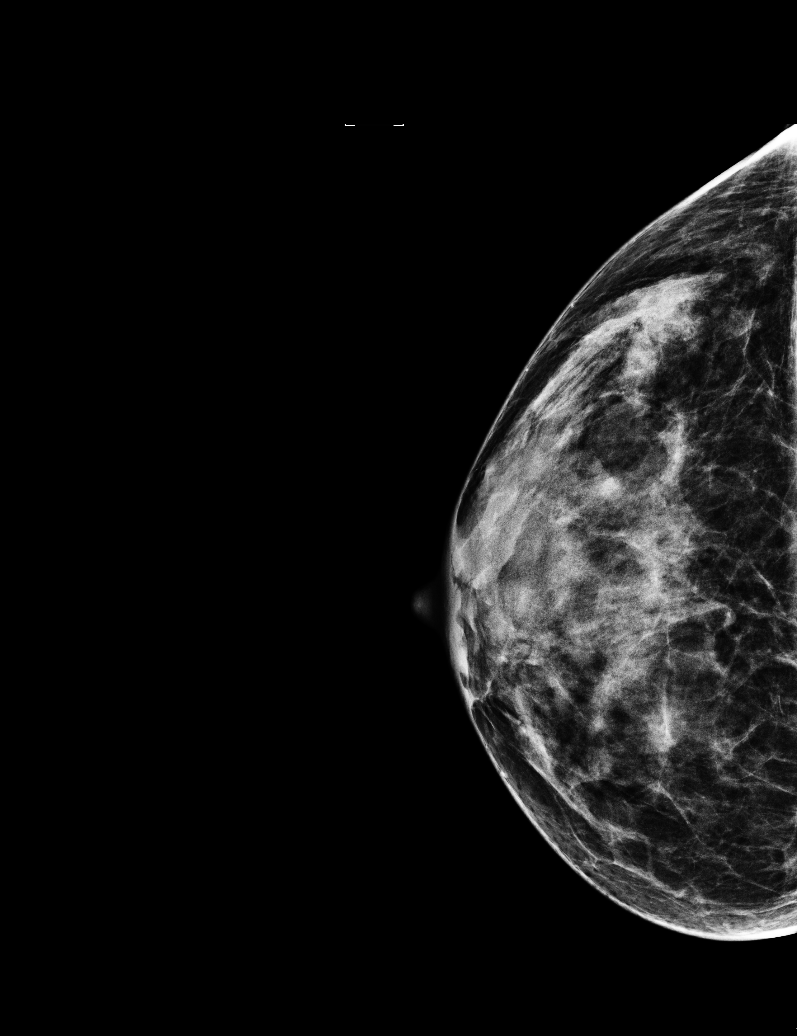

[L CC]
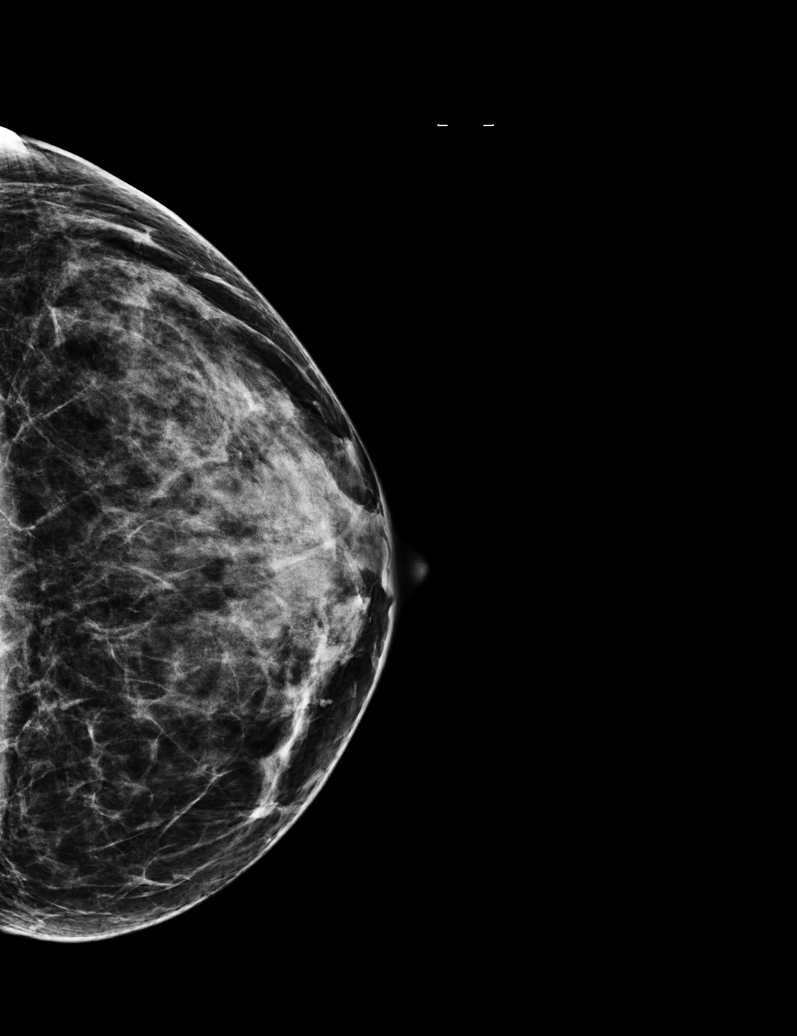

[L MLO]
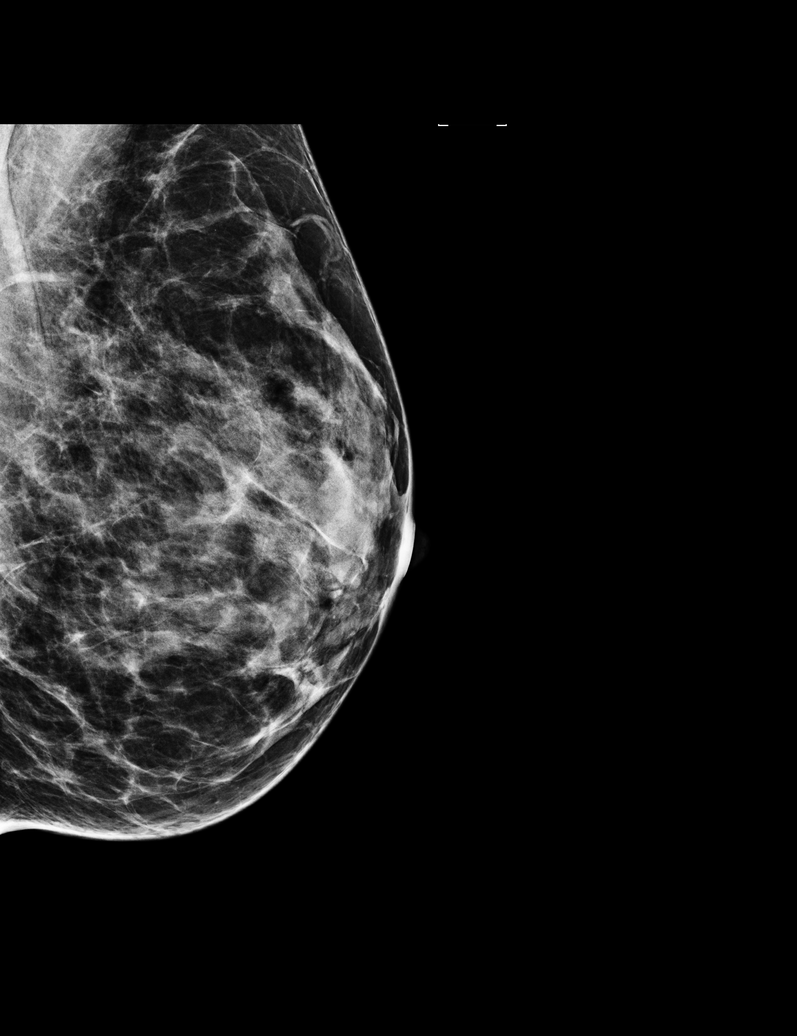

[R CC (2 of 2)]
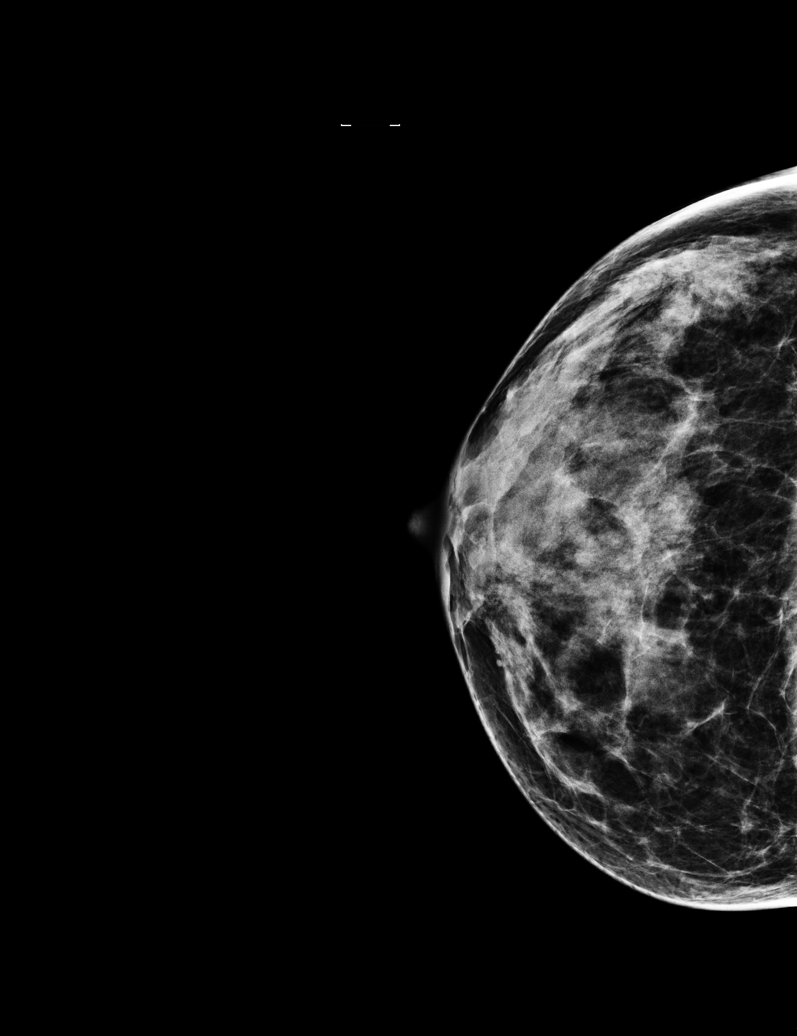

[R MLO]
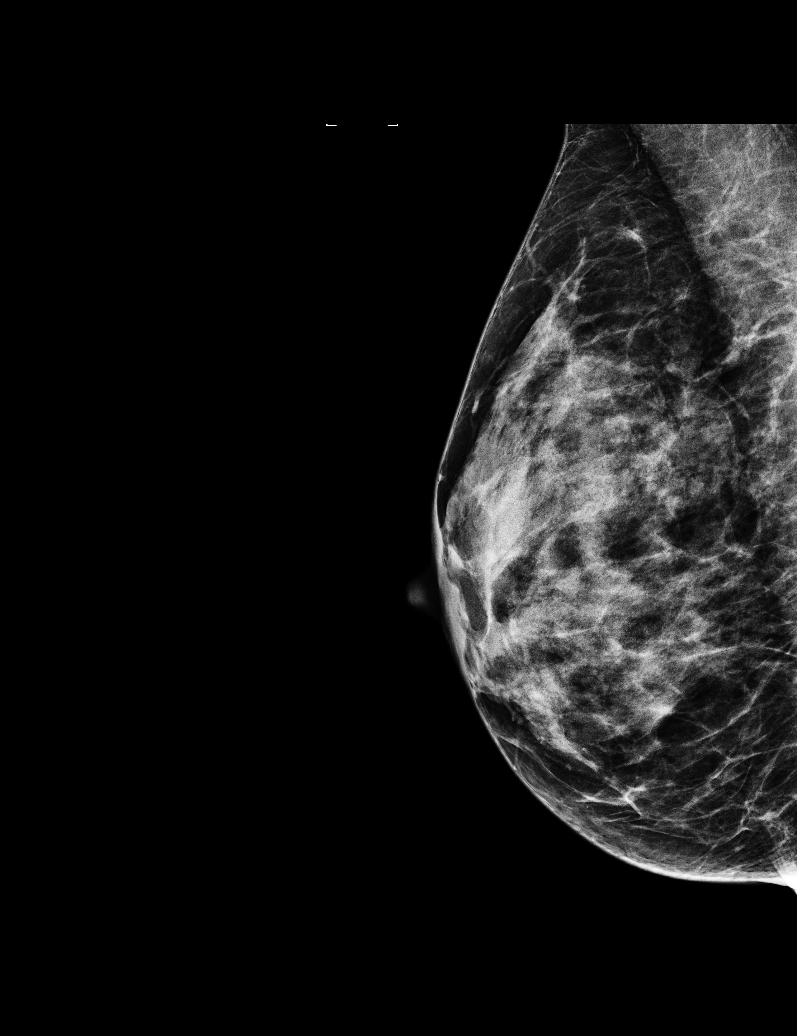

[R CC tomo (1 of 3)]
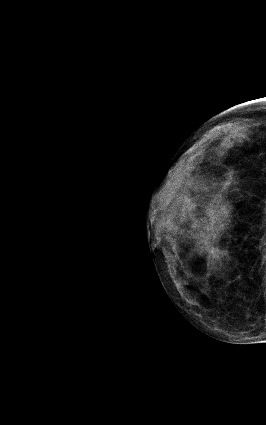

[L MLO tomo]
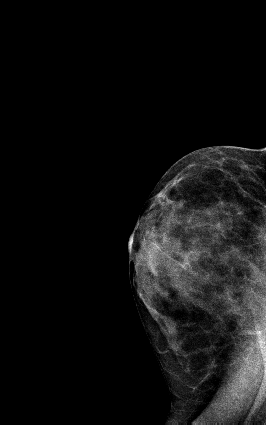

[L CC tomo]
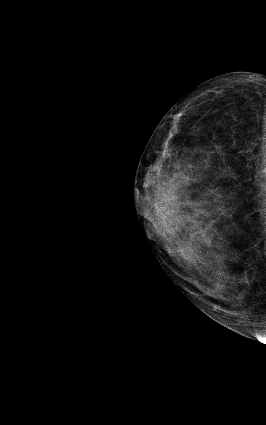

[R MLO tomo]
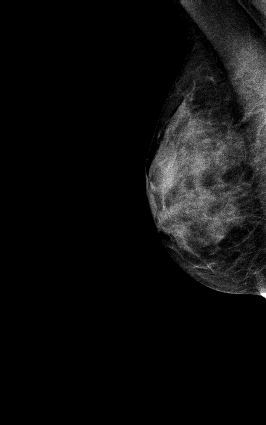

[R CC tomo (2 of 3)]
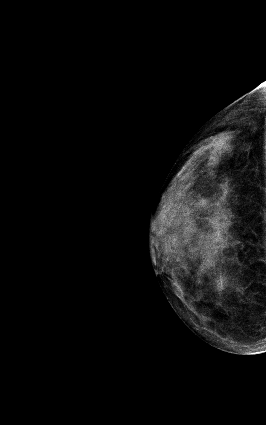

[R CC tomo (3 of 3) · tomo slice 23/46.0]
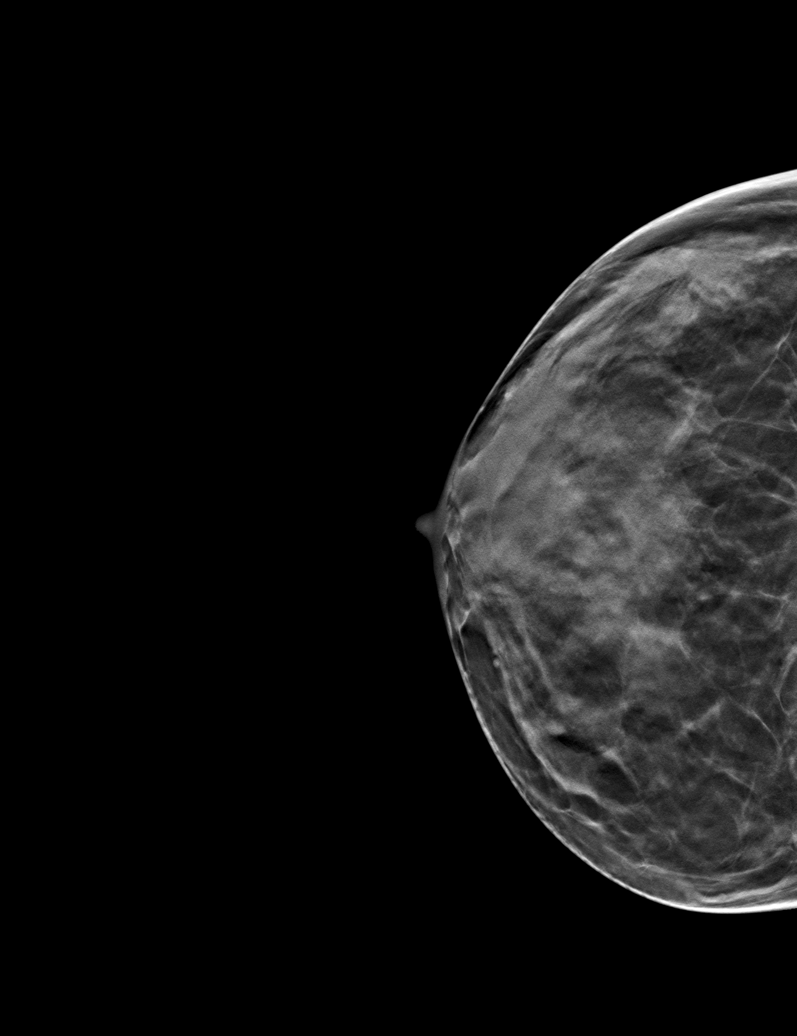

[11 of 35 positions shown; findings below may reference images not displayed]

ACR Breast Density Category c: The breast tissue is heterogeneously
dense, which may obscure small masses.
FINDINGS: There are no findings suspicious for malignancy. Images were
processed with CAD.
IMPRESSION: No mammographic evidence of malignancy. A result letter of this
screening mammogram will be mailed directly to the patient.

RECOMMENDATION:
Screening mammogram in one year. (Code:[SM])

BI-RADS CATEGORY  1: Negative.

## 2014-07-21 ENCOUNTER — Other Ambulatory Visit: Payer: Self-pay

## 2014-07-21 DIAGNOSIS — Z1231 Encounter for screening mammogram for malignant neoplasm of breast: Secondary | ICD-10-CM

## 2014-08-18 ENCOUNTER — Ambulatory Visit
Admission: RE | Admit: 2014-08-18 | Discharge: 2014-08-18 | Disposition: A | Discharge status: Home / Self Care | Payer: PRIVATE HEALTH INSURANCE | Source: Ambulatory Visit

## 2014-08-18 DIAGNOSIS — Z1231 Encounter for screening mammogram for malignant neoplasm of breast: Secondary | ICD-10-CM

## 2014-08-18 IMAGING — MG MM SCREENING BREAST TOMO BILATERAL
12 series · 12 of 28 positions shown · non-contrast
Comparison: Previous exam(s).

CLINICAL DATA: Screening.

EXAM:
DIGITAL SCREENING BILATERAL MAMMOGRAM WITH 3D TOMO WITH CAD

[L CC]
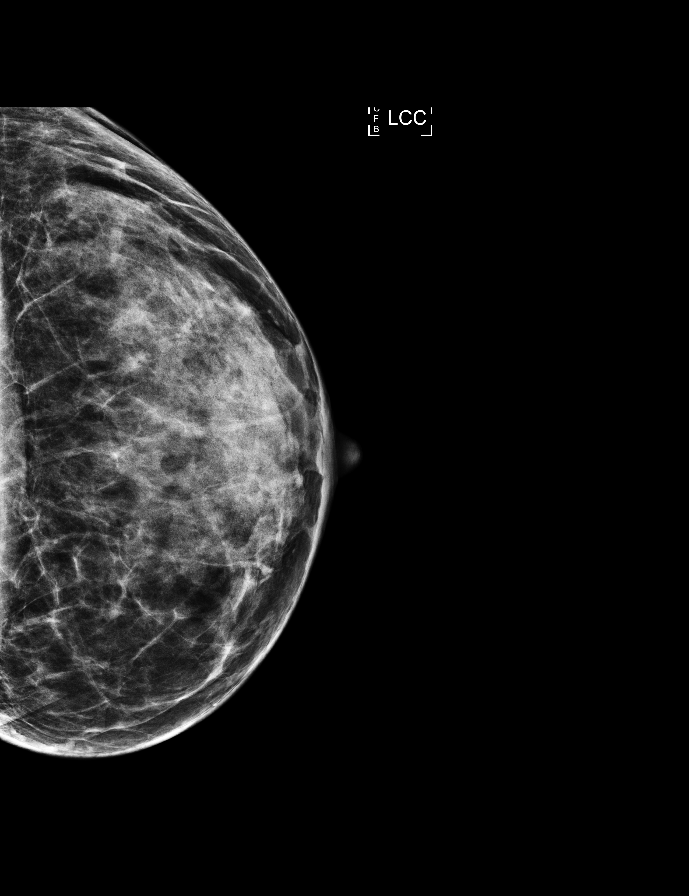

[R CC]
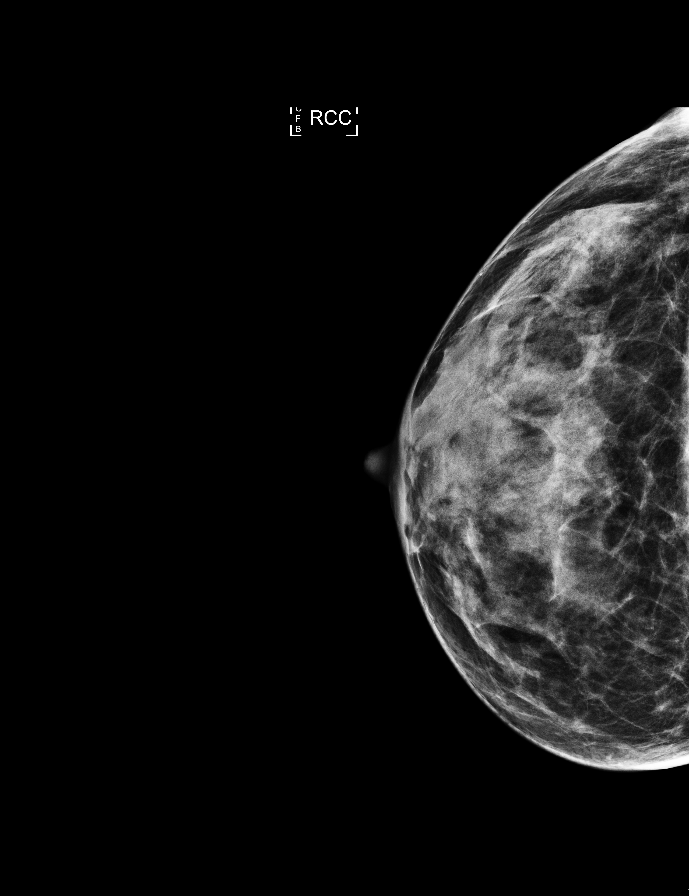

[L MLO]
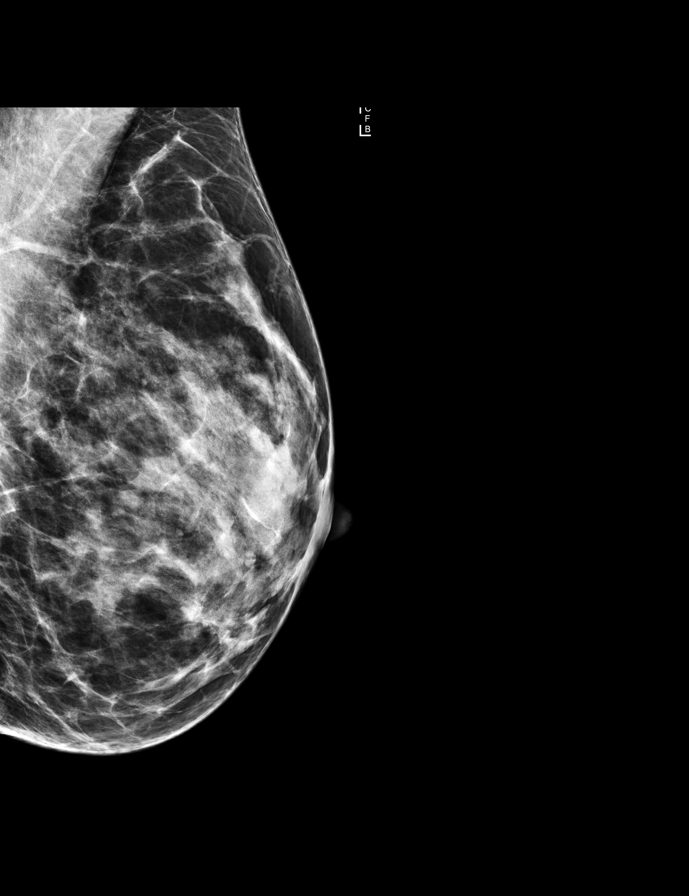

[R MLO]
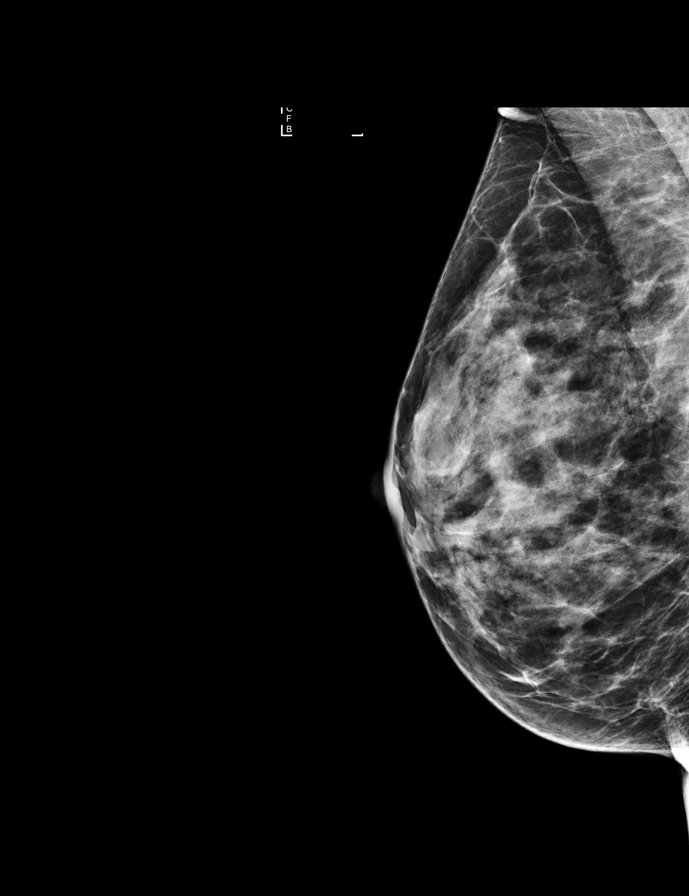

[L MLO tomo (1 of 2)]
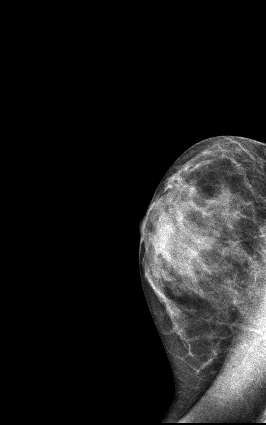

[R CC tomo (1 of 2)]
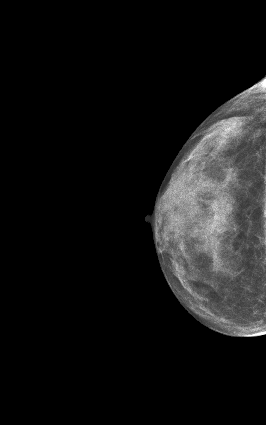

[L CC tomo (1 of 2)]
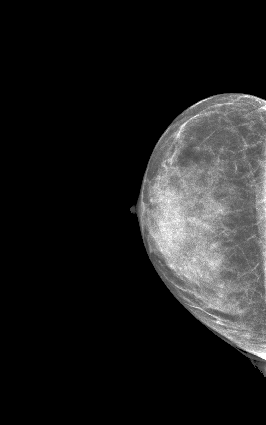

[R MLO tomo (1 of 2)]
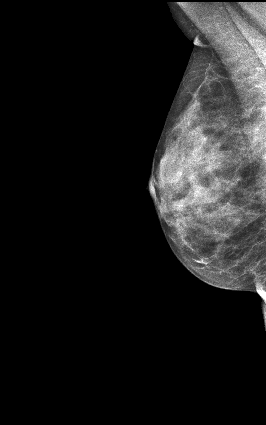

[L CC tomo (2 of 2) · tomo slice 28/55.0]
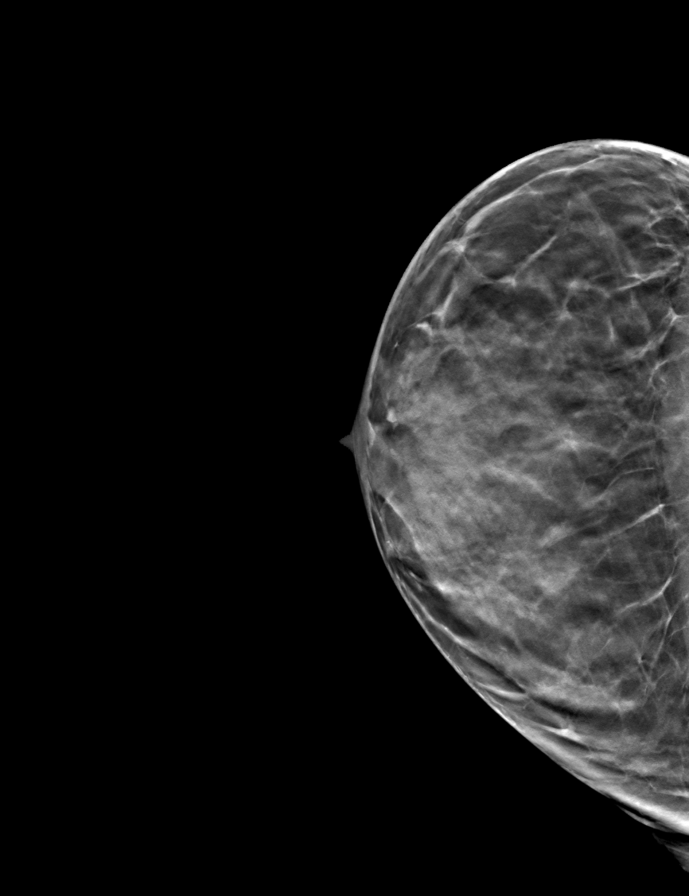

[R CC tomo (2 of 2) · tomo slice 25/50.0]
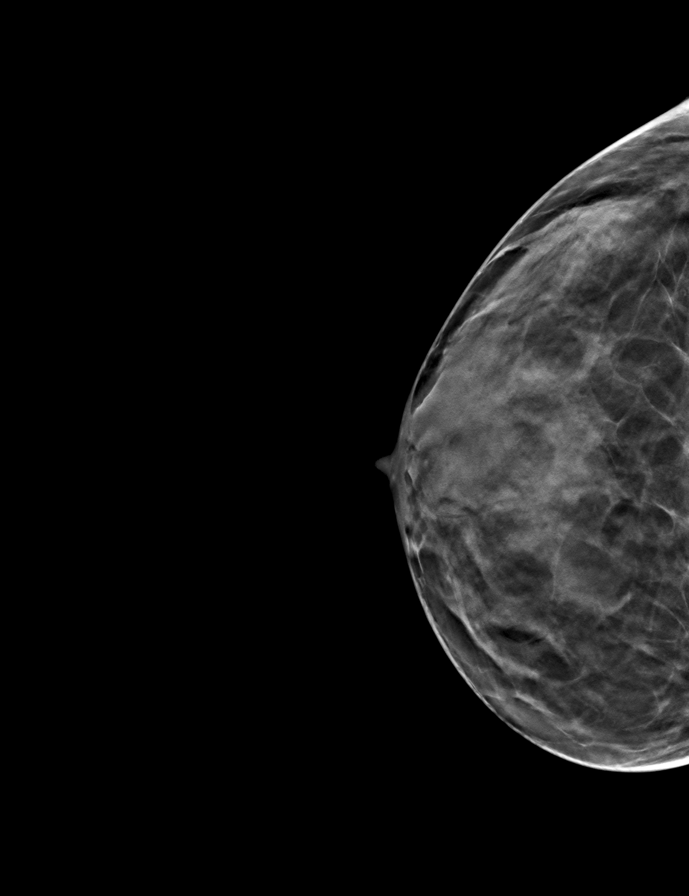

[L MLO tomo (2 of 2) · tomo slice 27/52.0]
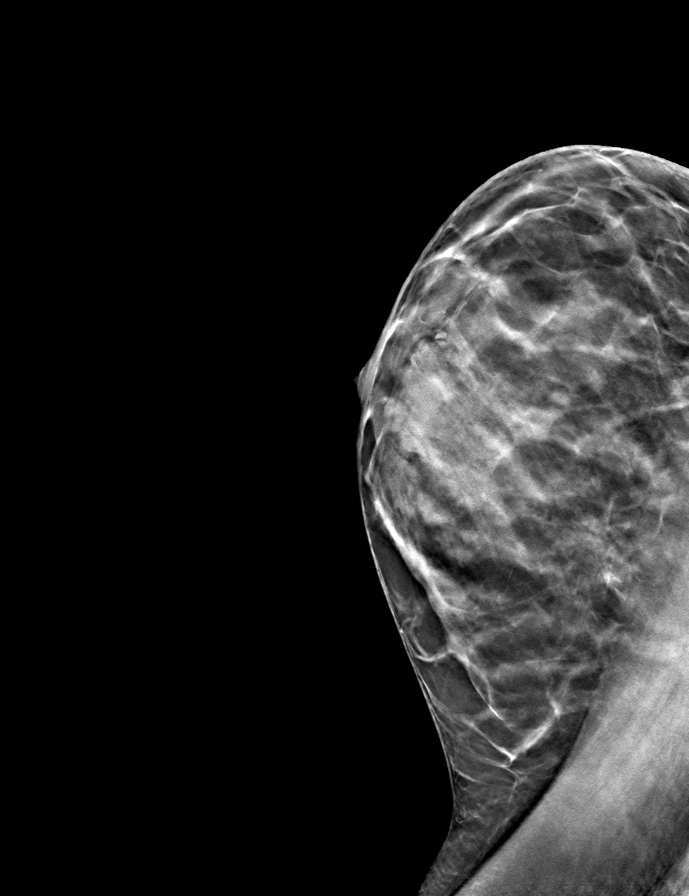

[R MLO tomo (2 of 2) · tomo slice 25/48.0]
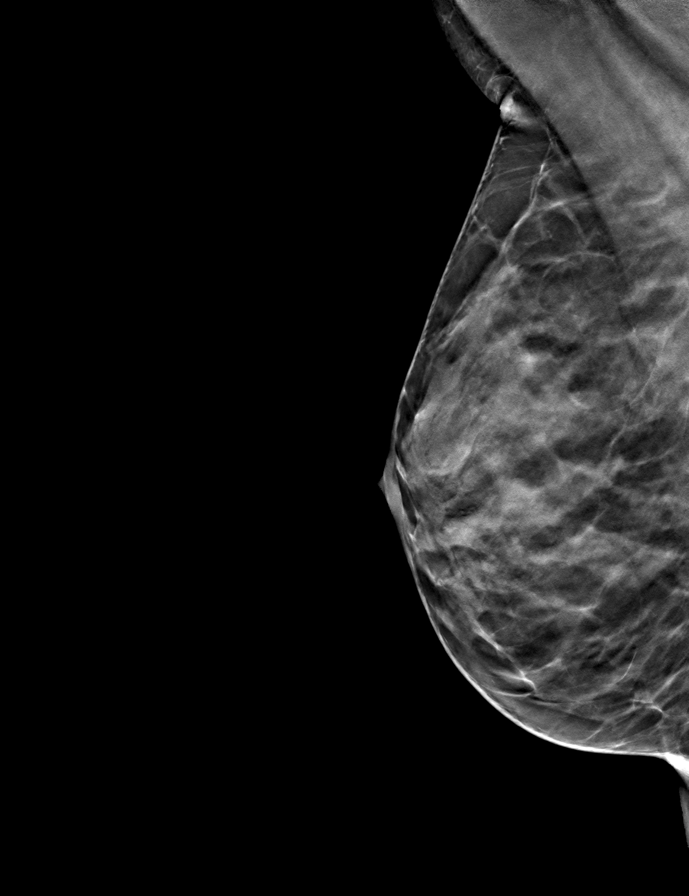

[12 of 28 positions shown; findings below may reference images not displayed]

ACR Breast Density Category d: The breast tissue is extremely dense,
which lowers the sensitivity of mammography.
FINDINGS: There are no findings suspicious for malignancy. Images were
processed with CAD.
IMPRESSION: No mammographic evidence of malignancy. A result letter of this
screening mammogram will be mailed directly to the patient.

RECOMMENDATION:
Screening mammogram in one year. (Code:[8S])

BI-RADS CATEGORY  1: Negative.

## 2015-01-23 ENCOUNTER — Ambulatory Visit (INDEPENDENT_AMBULATORY_CARE_PROVIDER_SITE_OTHER): Payer: PRIVATE HEALTH INSURANCE | Admitting: Emergency Medicine

## 2015-01-23 ENCOUNTER — Ambulatory Visit (INDEPENDENT_AMBULATORY_CARE_PROVIDER_SITE_OTHER): Payer: PRIVATE HEALTH INSURANCE

## 2015-01-23 VITALS — BP 102/64 | HR 82 | Temp 97.6°F | Resp 16 | Ht 66.3 in | Wt 140.0 lb

## 2015-01-23 DIAGNOSIS — M25571 Pain in right ankle and joints of right foot: Secondary | ICD-10-CM | POA: Diagnosis not present

## 2015-01-23 DIAGNOSIS — M7671 Peroneal tendinitis, right leg: Secondary | ICD-10-CM | POA: Diagnosis not present

## 2015-01-23 IMAGING — CR DG FOOT COMPLETE 3+V*R*
3 series · 3 of 3 positions shown · non-contrast
Comparison: None.

CLINICAL DATA: Patient with lateral foot pain.  No known injury.

EXAM:
RIGHT FOOT COMPLETE - 3+ VIEW

[AP]
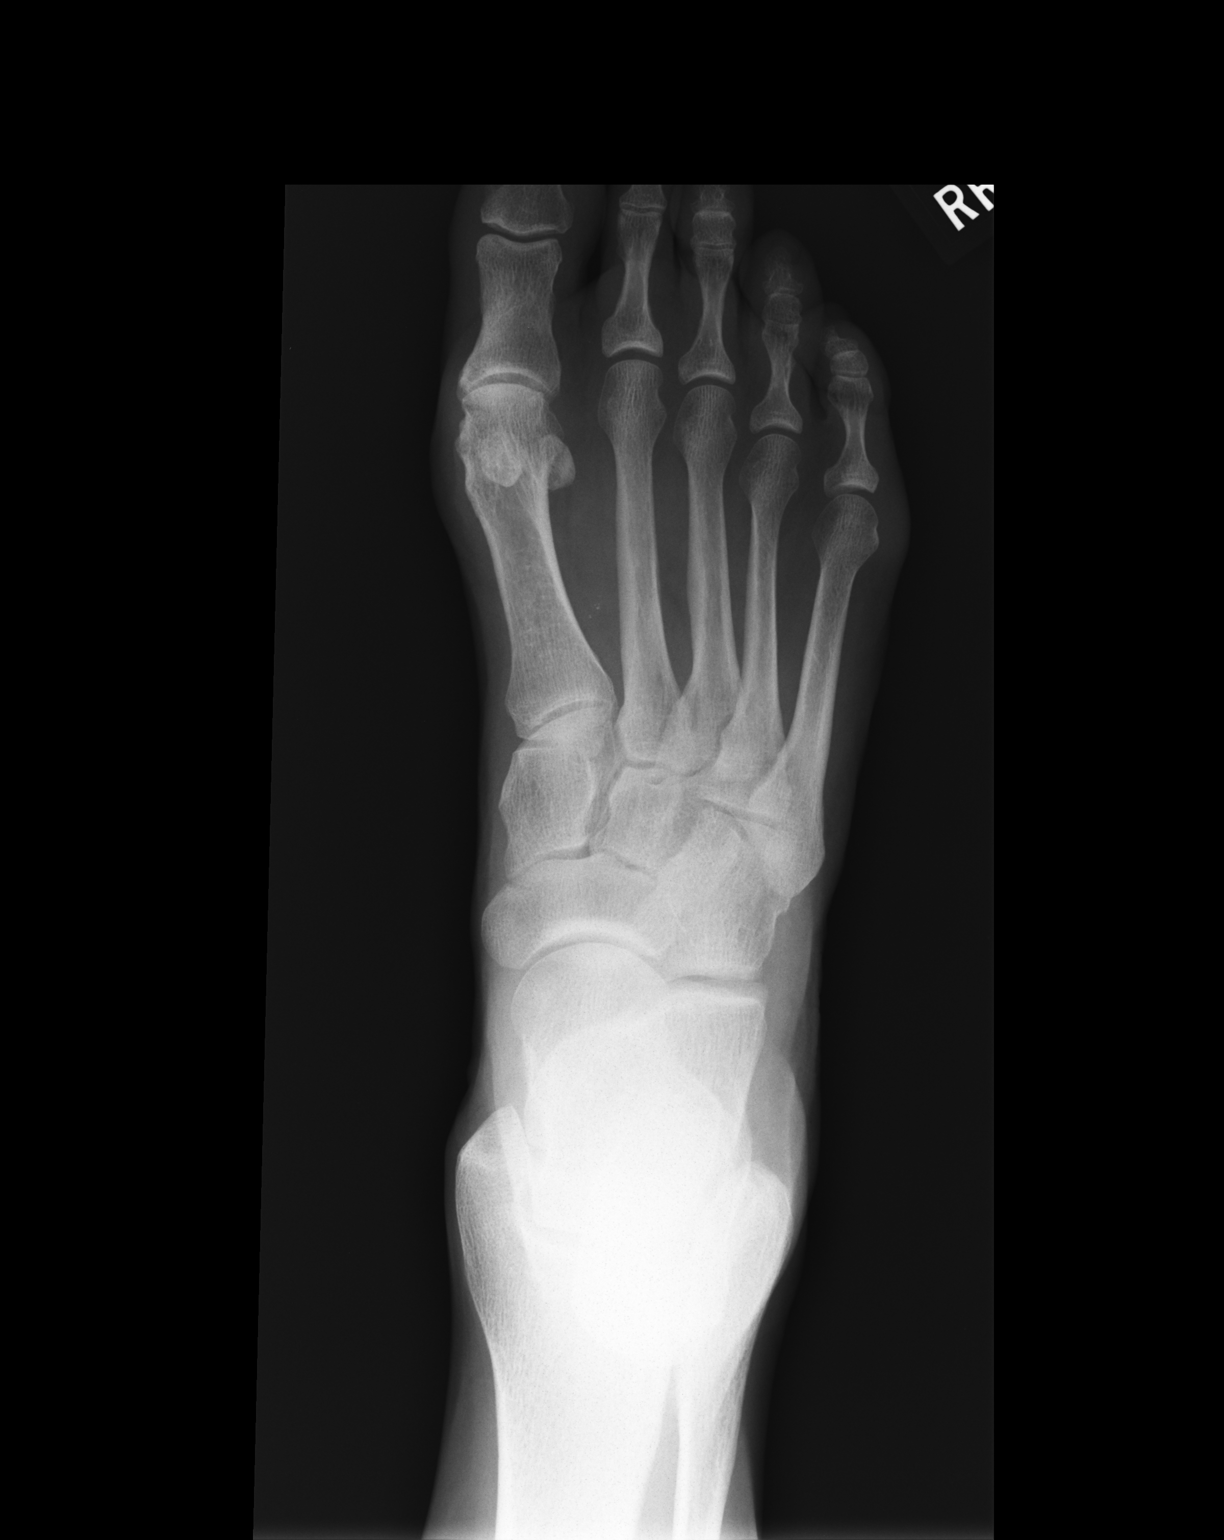

[ap obl int rot]
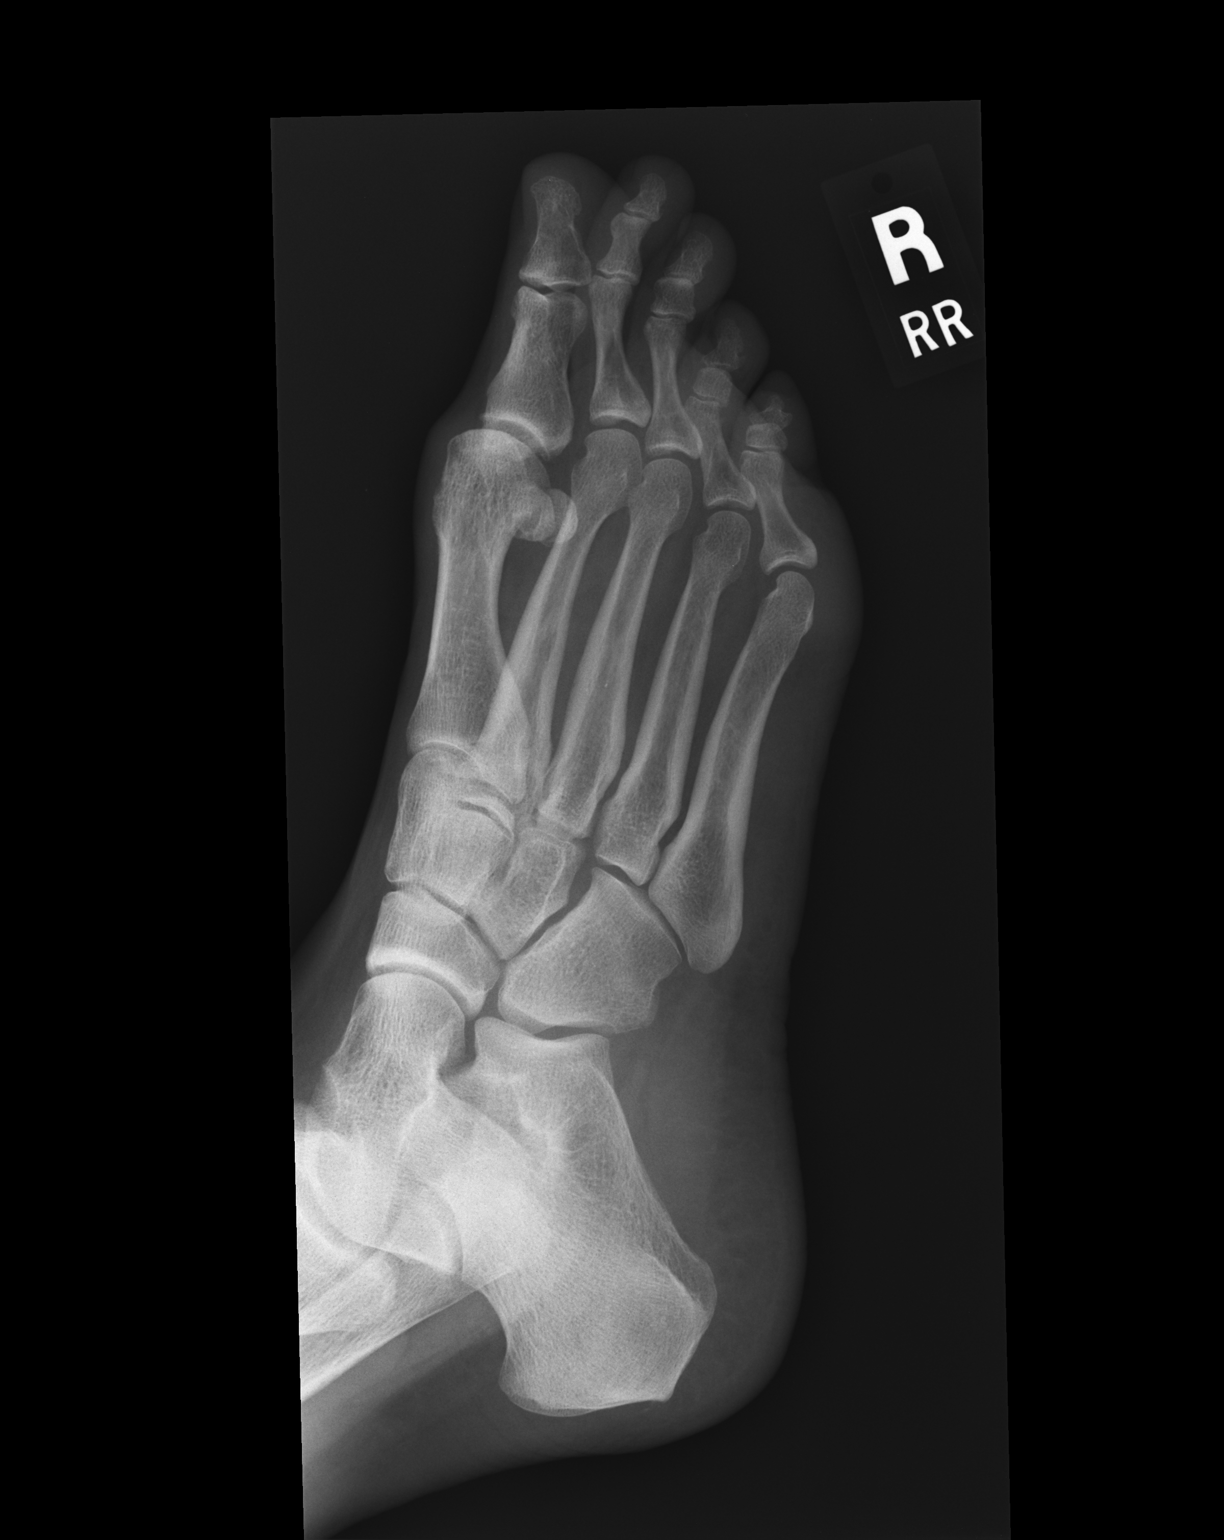

[lateral]
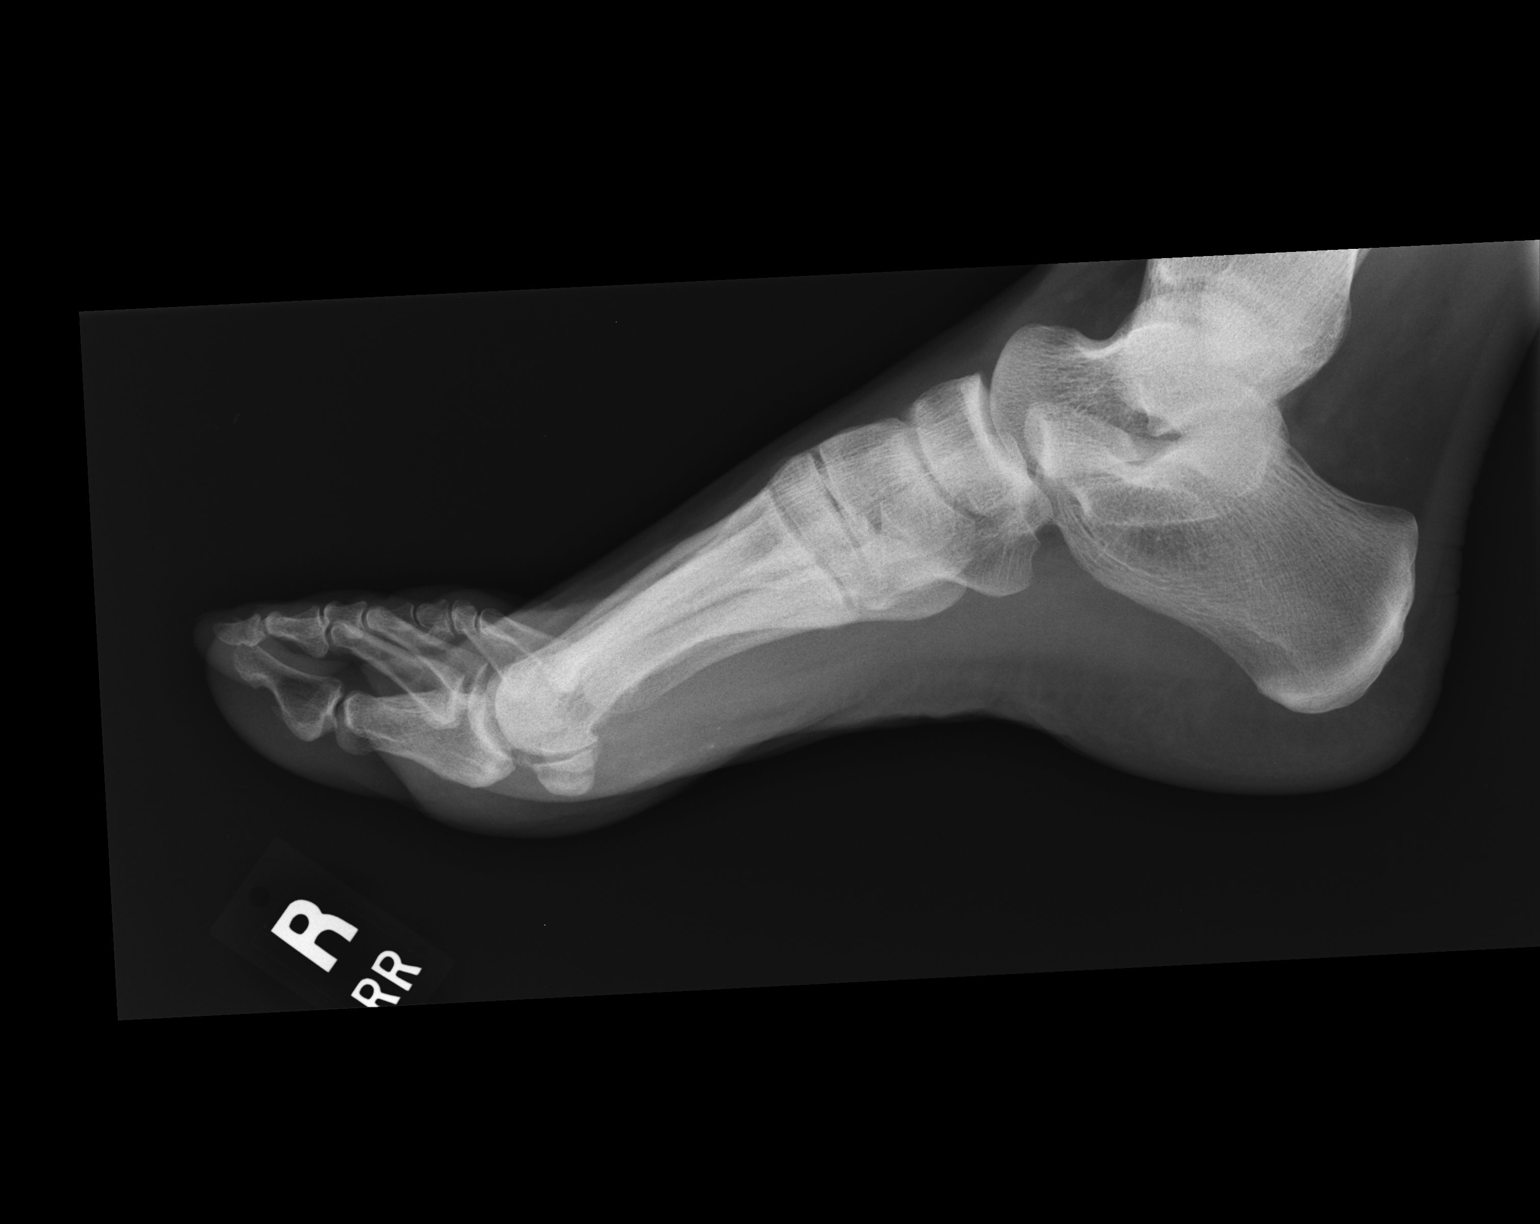

[3 of 3 positions shown; findings below may reference images not displayed]

FINDINGS: First MTP joint degenerative change. Normal anatomic alignment. No
evidence for acute fracture or dislocation. Soft tissues
unremarkable.
IMPRESSION: No acute osseous abnormality.

## 2015-01-23 MED ORDER — NAPROXEN SODIUM 550 MG PO TABS: 550.0000 mg | ORAL_TABLET | Freq: Two times a day (BID) | ORAL | Status: DC

## 2015-01-23 NOTE — Progress Notes (Signed)
Subjective:  Patient ID: [name], female    DOB: Jun 07, 1966  Age: 49 y.o. MRN: [id number]  CC: Foot Pain   HPI [name] presents  patient looks morning with pain in her right lateral ankle. She denies any defined injury. She said that the pain initially presented itself yesterday morning after she had done some physical therapy on Friday and then rode her bicycle 10 miles yesterday without complication.  History Alison NapoleonGillian has a past medical history of Allergy and Seizures.   She has past surgical history that includes Eye surgery and Abdominal hysterectomy.   Her  family history includes Hyperlipidemia in her maternal grandfather, mother, paternal grandfather, and paternal grandmother; Hypertension in her maternal grandfather, mother, paternal grandfather, and paternal grandmother; Stroke in her paternal grandmother.  She   reports that she has never smoked. She does not have any smokeless tobacco history on file. Her alcohol and drug histories are not on file.  No outpatient prescriptions prior to visit.   No facility-administered medications prior to visit.    History   Social History  . Marital Status: Married    Spouse Name: N/A  . Number of Children: N/A  . Years of Education: N/A   Social History Main Topics  . Smoking status: Never Smoker   . Smokeless tobacco: Not on file  . Alcohol Use: Not on file  . Drug Use: Not on file  . Sexual Activity: Not on file   Other Topics Concern  . None   Social History Narrative  . None     Review of Systems  Constitutional: Negative for fever, chills and appetite change.  HENT: Negative for congestion, ear pain, postnasal drip, sinus pressure and sore throat.   Eyes: Negative for pain and redness.  Respiratory: Negative for cough, shortness of breath and wheezing.   Cardiovascular: Negative for leg swelling.  Gastrointestinal: Negative for nausea, vomiting, abdominal pain, diarrhea, constipation and  blood in stool.  Endocrine: Negative for polyuria.  Genitourinary: Negative for dysuria, urgency, frequency and flank pain.  Musculoskeletal: Negative for gait problem.  Skin: Negative for rash.  Neurological: Negative for weakness and headaches.  Psychiatric/Behavioral: Negative for confusion and decreased concentration. The patient is not nervous/anxious.     Objective:  BP 102/64 mmHg  Pulse 82  Temp(Src) 97.6 F (36.4 C) (Oral)  Resp 16  Ht 5' 6.3" (1.684 m)  Wt 140 lb (63.504 kg)  BMI 22.39 kg/m2  SpO2 98%  Physical Exam  Constitutional: She is oriented to person, place, and time. She appears well-developed and well-nourished. No distress.  HENT:  Head: Normocephalic and atraumatic.  Right Ear: External ear normal.  Left Ear: External ear normal.  Nose: Nose normal.  Eyes: Conjunctivae and EOM are normal. Pupils are equal, round, and reactive to light. No scleral icterus.  Neck: Normal range of motion. Neck supple. No tracheal deviation present.  Cardiovascular: Normal rate, regular rhythm and normal heart sounds.   Pulmonary/Chest: Effort normal. No respiratory distress. She has no wheezes. She has no rales.  Abdominal: She exhibits no mass. There is no tenderness. There is no rebound and no guarding.  Musculoskeletal: She exhibits no edema.  Lymphadenopathy:    She has no cervical adenopathy.  Neurological: She is alert and oriented to person, place, and time. Coordination normal.  Skin: Skin is warm and dry. No rash noted.  Psychiatric: She has a normal mood and affect. Her behavior is normal.   she has specific localized  point tenderness on her lateral ankle over the calcaneus actually inferior to the lateral malleolus.    Assessment & Plan:   [name] was seen today for foot pain.  Diagnoses and all orders for this visit:  Pain in joint, ankle and foot, right Orders: -     DG Foot Complete Right; Future  Peroneal tendonitis, right  Other orders -      naproxen sodium (ANAPROX DS) 550 MG tablet; Take 1 tablet (550 mg total) by mouth 2 (two) times daily with a meal.   I am having [name] start on naproxen sodium. I am also having her maintain her cycloSPORINE, omeprazole, tretinoin, and multivitamin with minerals.  Meds ordered this encounter  Medications  . cycloSPORINE (RESTASIS) 0.05 % ophthalmic emulsion    Sig: 1 drop 2 (two) times daily.  Marland Kitchen omeprazole (PRILOSEC) 20 MG capsule    Sig: Take 20 mg by mouth daily.  Marland Kitchen tretinoin (RETIN-A) 0.01 % gel    Sig: Apply topically at bedtime.  . Multiple Vitamins-Minerals (MULTIVITAMIN WITH MINERALS) tablet    Sig: Take 1 tablet by mouth daily.  . naproxen sodium (ANAPROX DS) 550 MG tablet    Sig: Take 1 tablet (550 mg total) by mouth 2 (two) times daily with a meal.    Dispense:  40 tablet    Refill:  0    Appropriate red flag conditions were discussed with the patient as well as actions that should be taken.  Patient expressed his understanding.  Follow-up: Return if symptoms worsen or fail to improve.  Carmelina Dane, MD   [facility] reading (PRIMARY) by  Dr. Dareen Piano.  negative.

## 2015-01-23 NOTE — Patient Instructions (Signed)
Peroneal Tendinitis with Rehab Tendonitis is inflammation of a tendon. Inflammation of the tendons on the back of the outer ankle (peroneal tendons) is known as peroneal tendonitis. The peroneal tendons are responsible for connecting the muscles that allow you to stand on your tiptoes to the bones of the ankle. For this reason, peroneal tendonitis often causes pain when trying to complete such motions. Peroneal tendonitis often involves a tear (strain) of the peroneal tendons. Strains are classified into three categories. Grade 1 strains cause pain, but the tendon is not lengthened. Grade 2 strains include a lengthened ligament, due to the ligament being stretched or partially ruptured. With grade 2 strains there is still function, although function may be decreased. Grade 3 strains involve a complete tear of the tendon or muscle, and function is usually impaired. SYMPTOMS   Pain, tenderness, swelling, warmth, or redness over the back of the outer side of the ankle, the outer part of the mid-foot, or the bottom of the arch.  Pain that gets worse with ankle motion (especially when pushing off or pushing down with the front of the foot), or when standing on the ball of the foot or pushing the foot outward.  Crackling sound (crepitation) when the tendon is moved or touched. CAUSES  Peroneal tendinitis occurs when injury to the peroneal tendons causes the body to respond with inflammation. Common causes of injury include:  An overuse injury, in which the groove behind the outer ankle (where the tendon is located) causes wear on the tendon.  A sudden stress placed on the tendon, such as from an increase in the intensity, frequency, or duration of training.  Direct hit (trauma) to the tendon.  Return to activity too soon after a previous ankle injury. RISK INCREASES WITH:  Sports that require sudden, repetitive pushing off of the foot, such as jumping or quick starts.  Kicking and running sports,  especially running down hills or long distances.  Poor strength and flexibility.  Previous injury to the foot, ankle, or leg. PREVENTION  Warm up and stretch properly before activity.  Allow for adequate recovery between workouts.  Maintain physical fitness:  Strength, flexibility, and endurance.  Cardiovascular fitness.  Complete rehabilitation after previous injury. PROGNOSIS  If treated properly, peroneal tendonitis usually heals within 6 weeks.  RELATED COMPLICATIONS  Longer healing time, if not properly treated or if not given enough time to heal.  Recurring symptoms if activity is resumed too soon, with overuse, or when using poor technique.  If untreated, tendinitis may result in tendon rupture, requiring surgery. TREATMENT  Treatment first involves the use of ice and medicine to reduce pain and inflammation. The use of strengthening and stretching exercises may help reduce pain with activity. These exercises may be performed at home or with a therapist. Sometimes, the foot and ankle will be restrained for 10 to 14 days to promote healing. Your caregiver may advise that you place a heel lift in your shoes to reduce the stress placed on the tendon. If nonsurgical treatment is unsuccessful, surgery to remove the inflamed tendon lining (sheath) may be advised.  MEDICATION   If pain medicine is needed, nonsteroidal anti-inflammatory medicines (aspirin and ibuprofen), or other minor pain relievers (acetaminophen), are often advised.  Do not take pain medicine for 7 days before surgery.  Prescription pain relievers may be given, if your caregiver thinks they are needed. Use only as directed and only as much as you need. HEAT AND COLD  Cold treatment (icing) should   be applied for 10 to 15 minutes every 2 to 3 hours for inflammation and pain, and immediately after activity that aggravates your symptoms. Use ice packs or an ice massage.  Heat treatment may be used before  performing stretching and strengthening activities prescribed by your caregiver, physical therapist, or athletic trainer. Use a heat pack or a warm water soak. SEEK MEDICAL CARE IF:  Symptoms get worse or do not improve in 2 to 4 weeks, despite treatment.  New, unexplained symptoms develop. (Drugs used in treatment may produce side effects.) EXERCISES RANGE OF MOTION (ROM) AND STRETCHING EXERCISES - Peroneal Tendinitis These exercises may help you when beginning to rehabilitate your injury. Your symptoms may resolve with or without further involvement from your physician, physical therapist or athletic trainer. While completing these exercises, remember:   Restoring tissue flexibility helps normal motion to return to the joints. This allows healthier, less painful movement and activity.  An effective stretch should be held for at least 30 seconds.  A stretch should never be painful. You should only feel a gentle lengthening or release in the stretched tissue. RANGE OF MOTION - Ankle Eversion  Sit with your right / left ankle crossed over your opposite knee.  Grip your foot with your opposite hand, placing your thumb on the top of your foot and your fingers across the bottom of your foot.  Gently push your foot downward with a slight rotation, so your littlest toes rise slightly toward the ceiling.  You should feel a gentle stretch on the inside of your ankle. Hold the stretch for __________ seconds. Repeat __________ times. Complete this exercise __________ times per day.  RANGE OF MOTION - Ankle Inversion  Sit with your right / left ankle crossed over your opposite knee.  Grip your foot with your opposite hand, placing your thumb on the bottom of your foot and your fingers across the top of your foot.  Gently pull your foot so the smallest toe comes toward you and your thumb pushes the inside of the ball of your foot away from you.  You should feel a gentle stretch on the outside of  your ankle. Hold the stretch for __________ seconds. Repeat __________ times. Complete this exercise __________ times per day.  RANGE OF MOTION - Ankle Plantar Flexion  Sit with your right / left leg crossed over your opposite knee.  Use your opposite hand to pull the top of your foot and toes toward you.  You should feel a gentle stretch on the top of your foot and ankle. Hold this position for __________ seconds. Repeat __________ times. Complete __________ times per day.  STRETCH - Gastroc, Standing  Place your hands on a wall.  Extend your right / left leg behind you, keeping the front knee somewhat bent.  Slightly point your toes inward on your back foot.  Keeping your right / left heel on the floor and your knee straight, shift your weight toward the wall, not allowing your back to arch.  You should feel a gentle stretch in the calf. Hold this position for __________ seconds. Repeat __________ times. Complete this stretch __________ times per day. STRETCH - Soleus, Standing  Place your hands on a wall.  Extend your right / left leg behind you, keeping the other knee somewhat bent.  Slightly point your toes inward on your back foot.  Keep your heel on the floor, bend your back knee, and slightly shift your weight over the back leg so that   you feel a gentle stretch deep in your back calf.  Hold this position for __________ seconds. Repeat __________ times. Complete this stretch __________ times per day. STRETCH - Gastrocsoleus, Standing Note: This exercise can place a lot of stress on your foot and ankle. Please complete this exercise only if specifically instructed by your caregiver.   Place the ball of your right / left foot on a step, keeping your other foot firmly on the same step.  Hold on to the wall or a rail for balance.  Slowly lift your other foot, allowing your body weight to press your heel down over the edge of the step.  You should feel a stretch in your  right / left calf.  Hold this position for __________ seconds.  Repeat this exercise with a slight bend in your knee. Repeat __________ times. Complete this stretch __________ times per day.  STRENGTHENING EXERCISES - Peroneal Tendinitis  These exercises may help you when beginning to rehabilitate your injury. They may resolve your symptoms with or without further involvement from your physician, physical therapist or athletic trainer. While completing these exercises, remember:   Muscles can gain both the endurance and the strength needed for everyday activities through controlled exercises.  Complete these exercises as instructed by your physician, physical therapist or athletic trainer. Increase the resistance and repetitions only as guided by your caregiver. STRENGTH - Dorsiflexors  Secure a rubber exercise band or tubing to a fixed object (table, pole) and loop the other end around your right / left foot.  Sit on the floor facing the fixed object. The band should be slightly tense when your foot is relaxed.  Slowly draw your foot back toward you, using your ankle and toes.  Hold this position for __________ seconds. Slowly release the tension in the band and return your foot to the starting position. Repeat __________ times. Complete this exercise __________ times per day.  STRENGTH - Towel Curls  Sit in a chair, on a non-carpeted surface.  Place your foot on a towel, keeping your heel on the floor.  Pull the towel toward your heel only by curling your toes. Keep your heel on the floor.  If instructed by your physician, physical therapist or athletic trainer, add weight to the end of the towel. Repeat __________ times. Complete this exercise __________ times per day. STRENGTH - Ankle Eversion   Secure one end of a rubber exercise band or tubing to a fixed object (table, pole). Loop the other end around your foot, just before your toes.  Place your fists between your knees.  This will focus your strengthening at your ankle.  Drawing the band across your opposite foot, away from the pole, slowly, pull your little toe out and up. Make sure the band is positioned to resist the entire motion.  Hold this position for __________ seconds.  Have your muscles resist the band, as it slowly pulls your foot back to the starting position. Repeat __________ times. Complete this exercise __________ times per day.  Document Released: 07/02/2005 Document Revised: 11/16/2013 Document Reviewed: 10/14/2008 [employer] Patient Information 2015 [employer]. This information is not intended to replace advice given to you by your health care provider. Make sure you discuss any questions you have with your health care provider.  

## 2015-07-19 ENCOUNTER — Other Ambulatory Visit: Payer: Self-pay

## 2015-07-19 DIAGNOSIS — Z1231 Encounter for screening mammogram for malignant neoplasm of breast: Secondary | ICD-10-CM

## 2015-08-02 ENCOUNTER — Ambulatory Visit
Admission: RE | Admit: 2015-08-02 | Discharge: 2015-08-02 | Disposition: A | Discharge status: Home / Self Care | Payer: Managed Care, Other (non HMO) | Source: Ambulatory Visit | Attending: Family Medicine | Admitting: Family Medicine

## 2015-08-02 ENCOUNTER — Other Ambulatory Visit: Payer: Self-pay | Admitting: Family Medicine

## 2015-08-02 DIAGNOSIS — M542 Cervicalgia: Secondary | ICD-10-CM

## 2015-08-02 IMAGING — CR DG CERVICAL SPINE 2 OR 3 VIEWS
3 series · 3 of 3 positions shown · non-contrast
Comparison: [DATE] cervical spine MRI.

CLINICAL DATA: Neck pain.  No reported injury.

EXAM:
CERVICAL SPINE - 2-3 VIEW

[w c-spine lat]
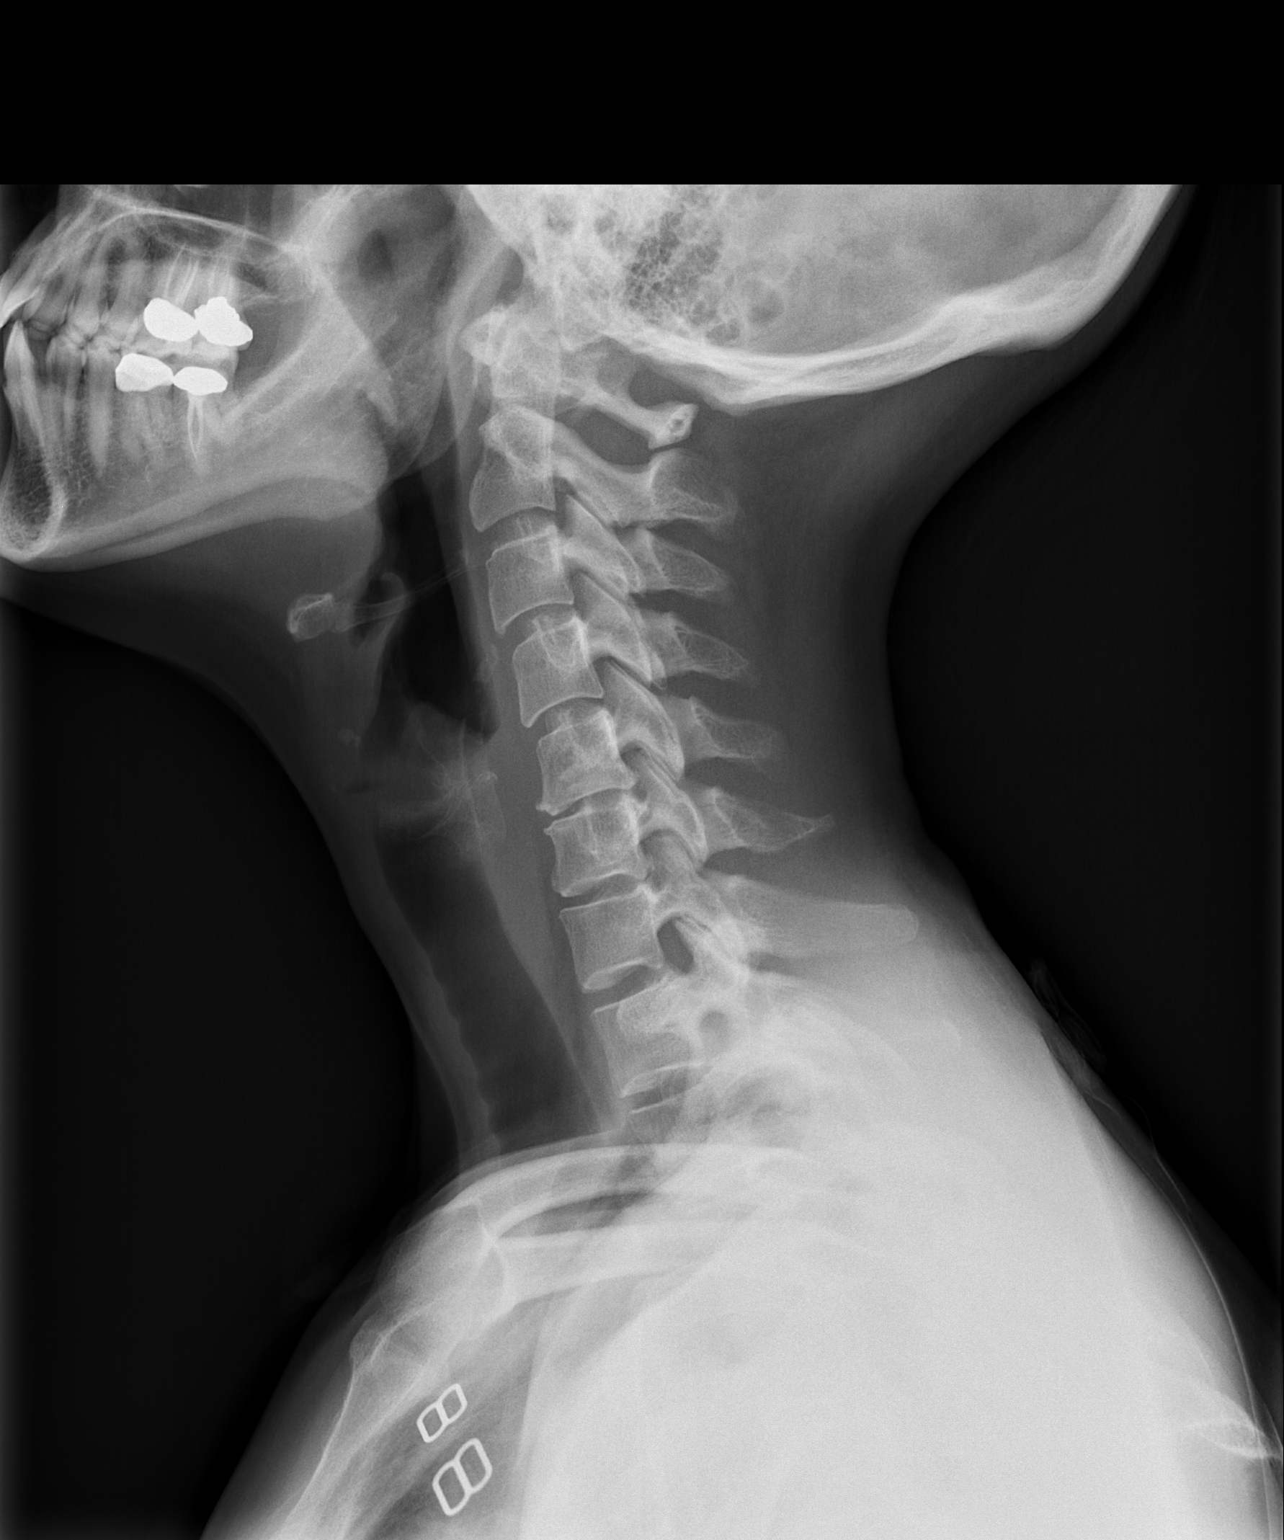

[w c-spine a.p. *]
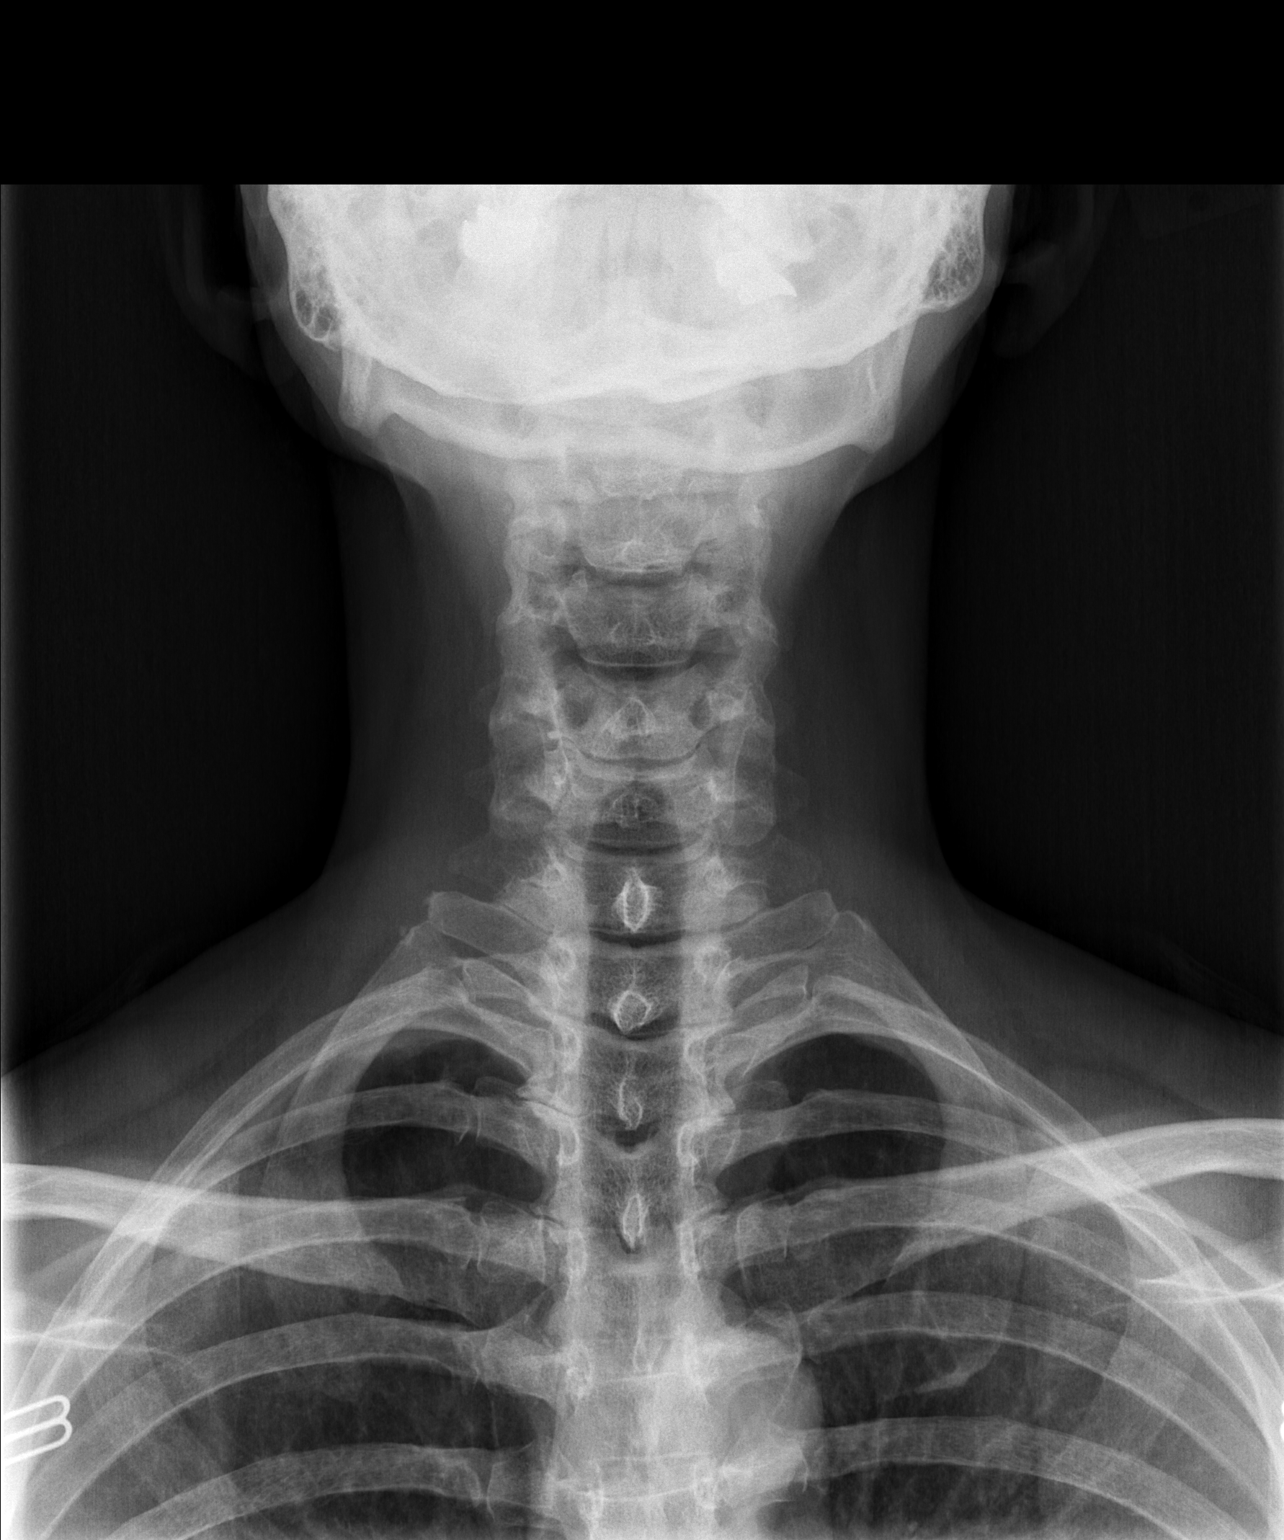

[w c-spine odontoid *]
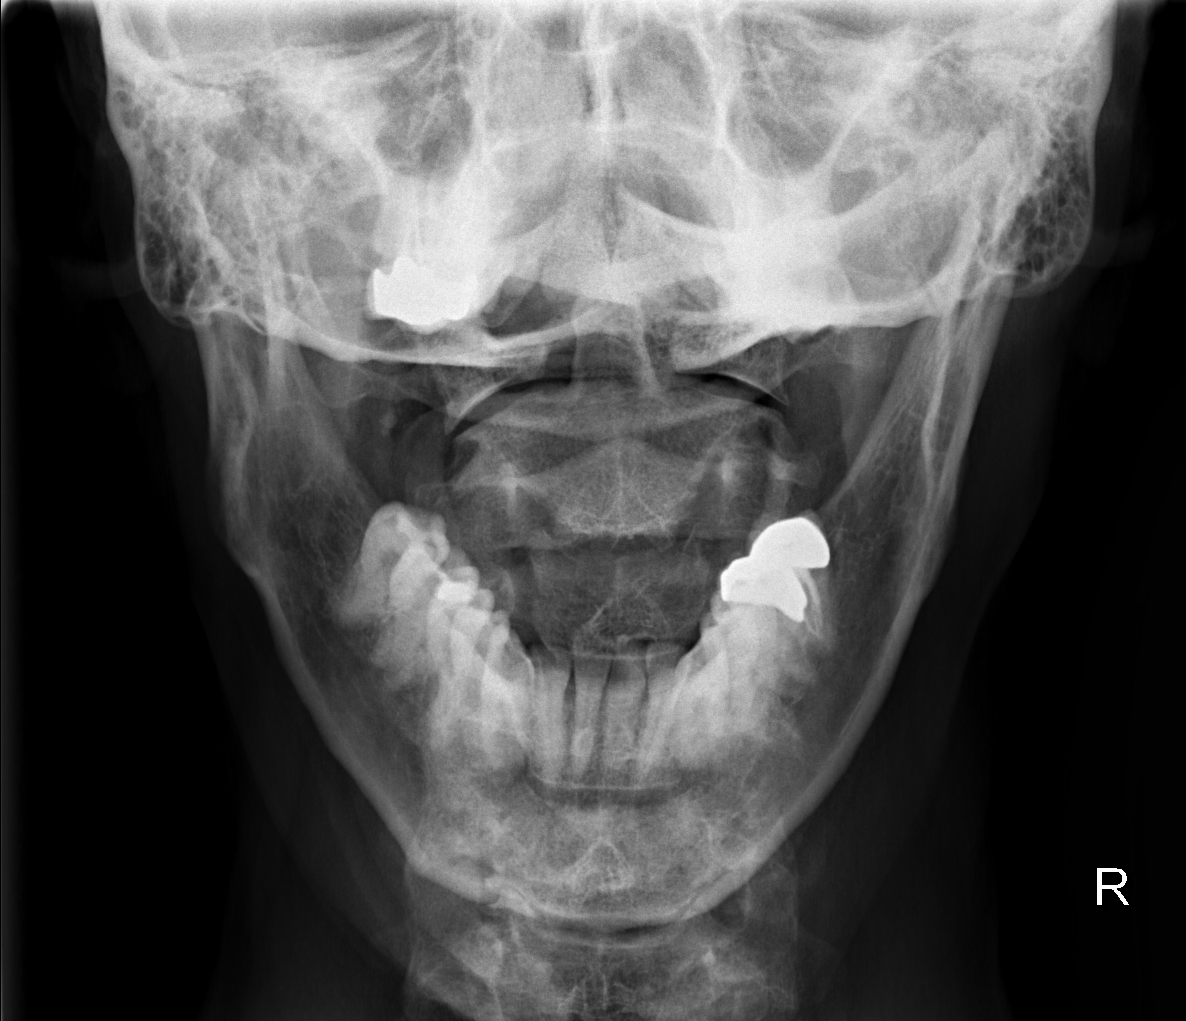

[3 of 3 positions shown; findings below may reference images not displayed]

FINDINGS: On the lateral view the cervical spine is visualized to the level of
C7-T1. There is straightening of the cervical spine, usually due to
positioning and/ or muscle spasm. Pre-vertebral soft tissues are
within normal limits. No fracture is detected in the cervical spine.
Dens is well positioned between the lateral masses of C1. Moderate
degenerative disc disease at C5-6. Mild degenerative disc disease at
C6-7. Minimal 1-2 mm retrolisthesis at C5-6. Mild facet arthropathy
in the lower cervical spine. No aggressive-appearing focal osseous
lesions.
IMPRESSION: 1. No cervical spine fracture.
2. Mild-to-moderate degenerative disc disease in the lower cervical
spine, most prominent at C5-6.
3. Minimal 1-2 mm retrolisthesis at C5-6, likely degenerative in
etiology.

## 2015-08-22 ENCOUNTER — Ambulatory Visit
Admission: RE | Admit: 2015-08-22 | Discharge: 2015-08-22 | Disposition: A | Discharge status: Home / Self Care | Payer: Managed Care, Other (non HMO) | Source: Ambulatory Visit

## 2015-08-22 DIAGNOSIS — Z1231 Encounter for screening mammogram for malignant neoplasm of breast: Secondary | ICD-10-CM

## 2015-08-23 ENCOUNTER — Other Ambulatory Visit: Payer: Self-pay | Admitting: Family Medicine

## 2015-08-23 DIAGNOSIS — R928 Other abnormal and inconclusive findings on diagnostic imaging of breast: Secondary | ICD-10-CM

## 2015-08-29 ENCOUNTER — Ambulatory Visit
Admission: RE | Admit: 2015-08-29 | Discharge: 2015-08-29 | Disposition: A | Discharge status: Home / Self Care | Payer: Managed Care, Other (non HMO) | Source: Ambulatory Visit | Attending: Family Medicine | Admitting: Family Medicine

## 2015-08-29 ENCOUNTER — Other Ambulatory Visit: Payer: Self-pay | Admitting: Family Medicine

## 2015-08-29 DIAGNOSIS — R928 Other abnormal and inconclusive findings on diagnostic imaging of breast: Secondary | ICD-10-CM

## 2015-08-29 IMAGING — US US BREAST*R* LIMITED INC AXILLA
1 series · 12 of 13 positions shown · non-contrast
Comparison: [DATE] and earlier priors

CLINICAL DATA: Patient recalled from recent 3D screening mammogram
for possible distortion identified in the outer right breast. Mrs.
LIANG describes to me she has recently had a few tiny focal mildly
erythematous raised skin areas in the right axilla that she believes
are likely related to shaving. She states that one palpable area in
the right axilla has significantly decreased in size since she first
noticed it.

EXAM:
DIGITAL DIAGNOSTIC RIGHT MAMMOGRAM WITH 3D TOMOSYNTHESIS WITH CAD
ULTRASOUND RIGHT BREAST

[Series 1: us breast*right* limited inc axilla · 0.07mm/px · 12 of 13 slices shown]
[im 1/13]
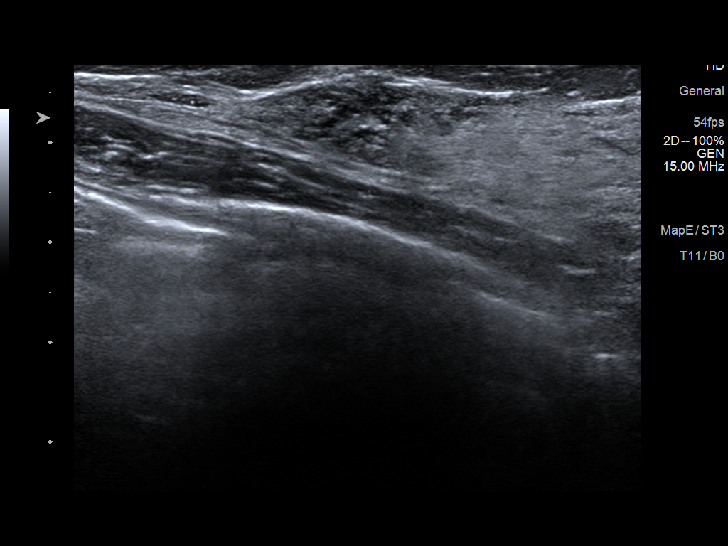
[im 2/13]
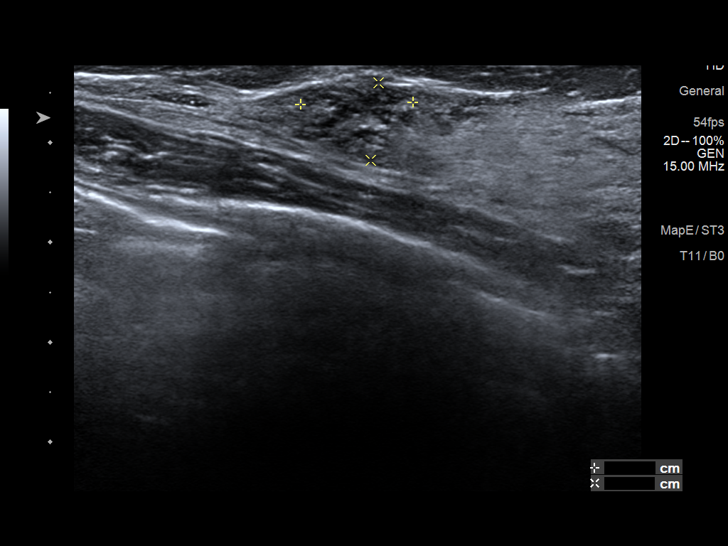
[im 3/13]
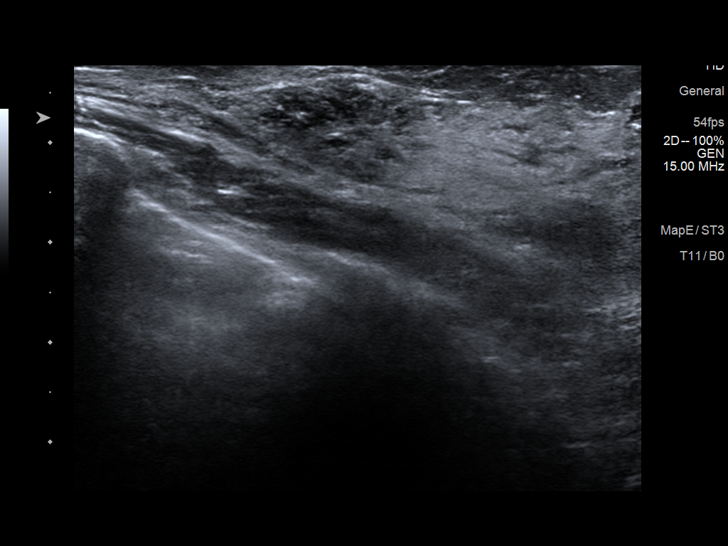
[im 4/13]
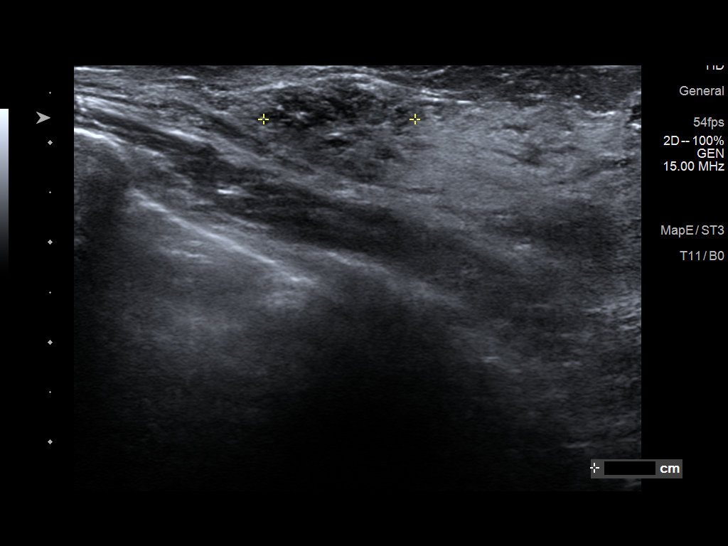
[im 5/13]
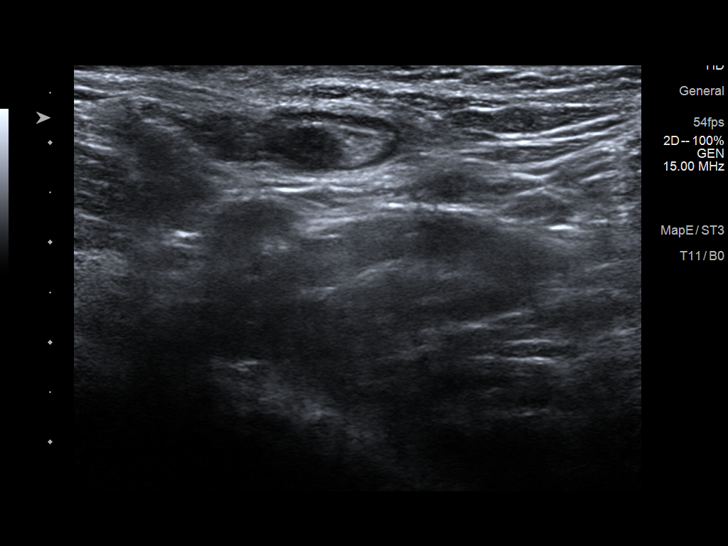
[im 6/13]
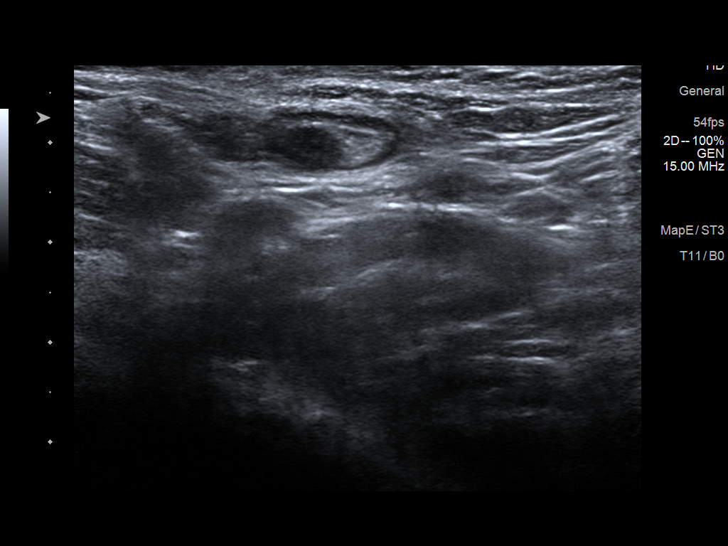
[im 8/13]
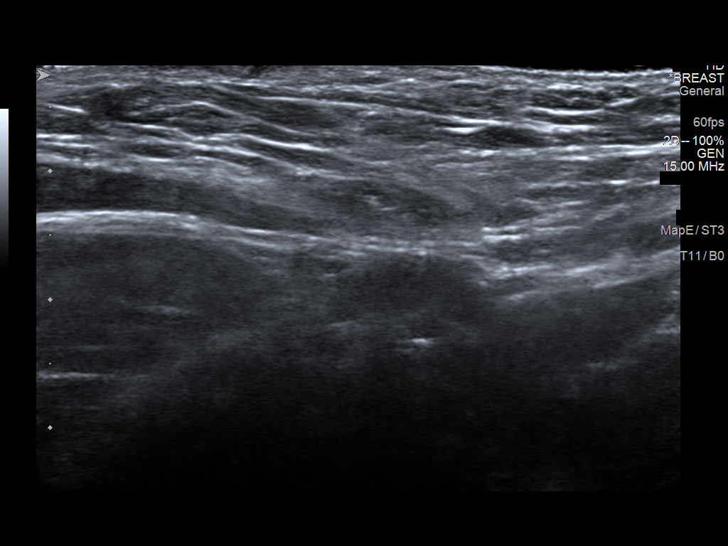
[im 9/13]
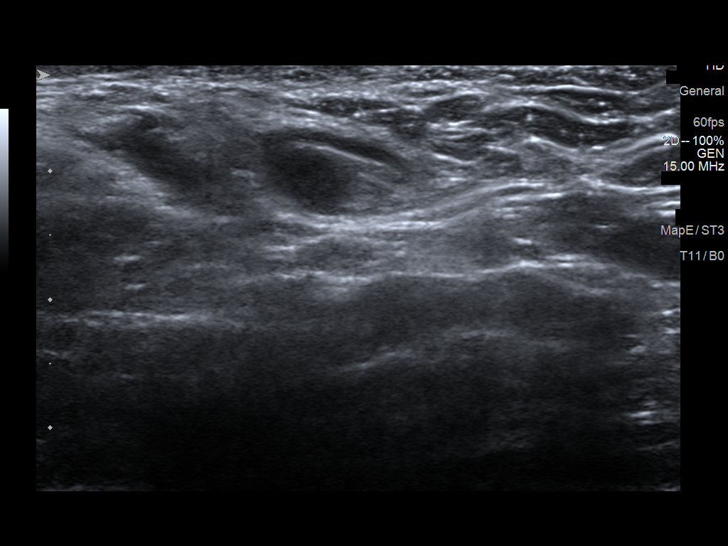
[im 10/13]
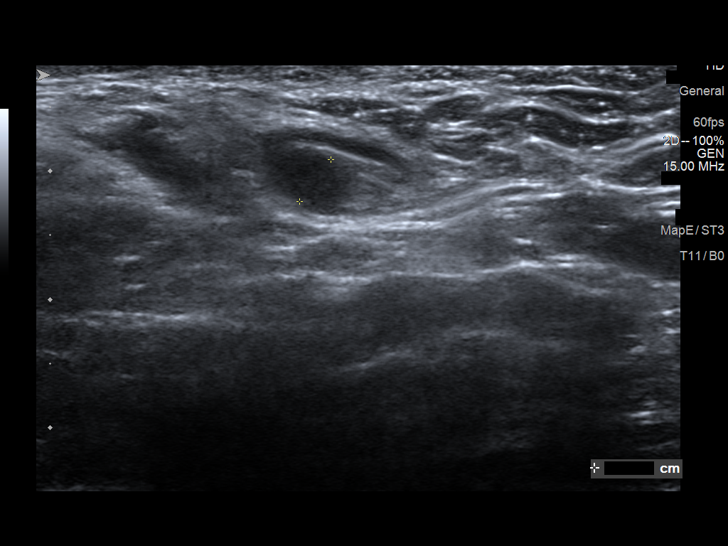
[im 11/13]
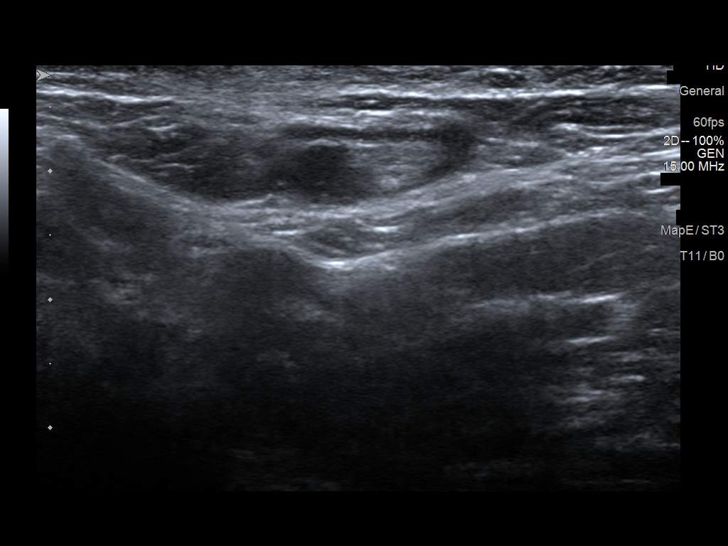
[im 12/13]
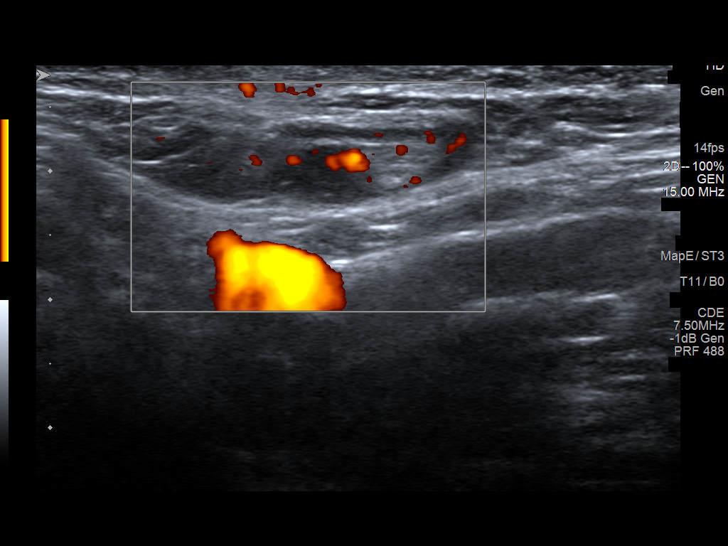
[im 13/13]
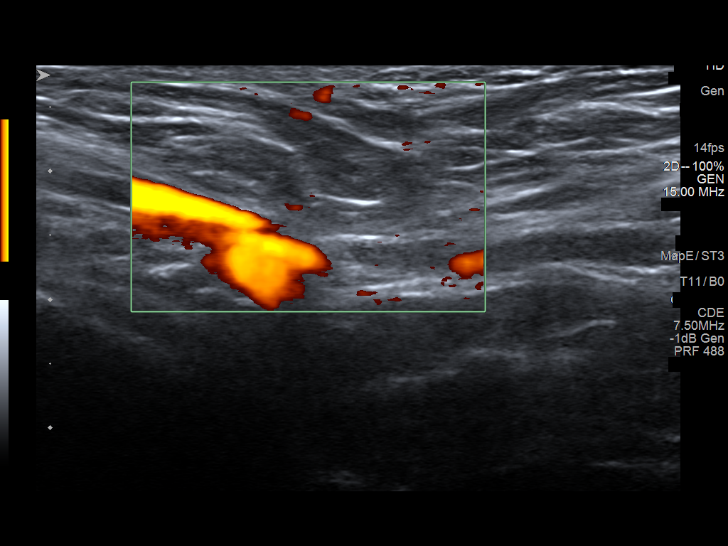

[12 of 13 positions shown; findings below may reference images not displayed]

ACR Breast Density Category c: The breast tissue is heterogeneously
dense, which may obscure small masses.
FINDINGS: Spot compression 3D tomographic images of the outer right breast
confirm an area of persistent architectural distortion far
laterally, posterior third. The area of distortion measures
approximately 2.4 cm greatest diameter. The architectural distortion
is best visualized in the CC projection.

Mammographic images were processed with CAD.

On physical exam, I do palpate a linear superficial area of tubular
firmness in the right axilla. There are approximately 2-3 tiny
mildly erythematous areas on the skin of the right axilla, measuring
approximately 1-3 mm each in size. I do not palpate lymphadenopathy.

There is palpable nodularity in the [DATE] position of the right
breast approximately 6 cm from the nipple.

Targeted ultrasound is performed, showing:

In the right axilla: There is a tiny fluid collection within the
dermis of the right axilla, in the region of one of the small
erythematous skin lesions, which may be secondary to skin knicks
from shaving. I do not detect any abnormal superficial veins. There
is a single 5 mm short axis lymph node that has a focally thickened
cortex. The thickened portion of the cortex measures 4 mm and
demonstrates vascular flow. The remainder of the cortex is 1 mm. No
additional abnormal lymph nodes are identified.

In the [DATE] position of the right breast approximately 6 cm from the
nipple is a 1.1 x 0.9 x 1.5 cm mixed echogenicity area with internal
mildly dilated ducts surrounded by hypoechoic tissue.
IMPRESSION: Persistent distortion in the far outer right breast. Probable
sonographic correlate [DATE] position right breast 6 cm from the
nipple. As the imaging findings are felt to be best visualized with
3D tomographic technique, biopsy is recommended using tomographic
stereotactic technique. Malignancy cannot be excluded. Complex
sclerosing lesion is also considered.

Focal cortical thickening of a single right axillary lymph node.
Malignancy cannot be excluded.

A few tiny erythematous areas involving the skin of the right axilla
are felt to be focal areas of dermal irritation related to shaving.

RECOMMENDATION:
1. 3D tomographic stereotactic biopsy of the area of distortion in
the outer right breast is recommended and has been scheduled for
[DATE].
2. Ultrasound-guided core needle biopsy of the thickened portion of
a right axillary lymph node is recommended and has been scheduled
for [DATE].

I have discussed the findings and recommendations with the patient.
Results were also provided in writing at the conclusion of the
visit. If applicable, a reminder letter will be sent to the patient
regarding the next appointment.

BI-RADS CATEGORY  4: Suspicious.

## 2015-08-30 ENCOUNTER — Other Ambulatory Visit: Payer: Self-pay | Admitting: Family Medicine

## 2015-08-30 DIAGNOSIS — R928 Other abnormal and inconclusive findings on diagnostic imaging of breast: Secondary | ICD-10-CM

## 2015-08-31 ENCOUNTER — Inpatient Hospital Stay: Admission: RE | Admit: 2015-08-31 | Payer: Managed Care, Other (non HMO) | Source: Ambulatory Visit

## 2015-08-31 ENCOUNTER — Ambulatory Visit
Admission: RE | Admit: 2015-08-31 | Discharge: 2015-08-31 | Disposition: A | Discharge status: Home / Self Care | Payer: Managed Care, Other (non HMO) | Source: Ambulatory Visit | Attending: Family Medicine | Admitting: Family Medicine

## 2015-08-31 DIAGNOSIS — R928 Other abnormal and inconclusive findings on diagnostic imaging of breast: Secondary | ICD-10-CM

## 2015-08-31 HISTORY — PX: BREAST BIOPSY: SHX20

## 2015-08-31 IMAGING — MG MM BREAST BX W/ LOC DEV 1ST LESION IMAGE BX SPEC STEREO GUIDE*R*
3 series · 3 of 3 positions shown · non-contrast
Comparison: Previous exams.

ADDENDUM:
Pathology revealed a complex sclerosing lesion/radial scar showing
fibrocystic changes with usual ductal hyperplasia, adenosis,
microcalcifications, and scattered foci of chronic inflammation in
the right breast. The right axillary lymph node biopsy was benign.
This was found to be concordant by Dr. TEVON. Pathology
results were discussed with the patient by telephone. The patient
reported doing well after the biopsy. Post biopsy instructions and
care were reviewed and questions were answered. The patient was
encouraged to call The [REDACTED] for any
additional concerns. Surgical consultation has been arranged with
Dr. TEVON at [REDACTED] on TEVON

Pathology results reported by TEVON RN, BSN on [DATE].
CLINICAL DATA: Distortion in the upper-outer quadrant of the right
breast.
EXAM:
RIGHT BREAST STEREOTACTIC CORE NEEDLE BIOPSY

[R CC (1 of 3)]
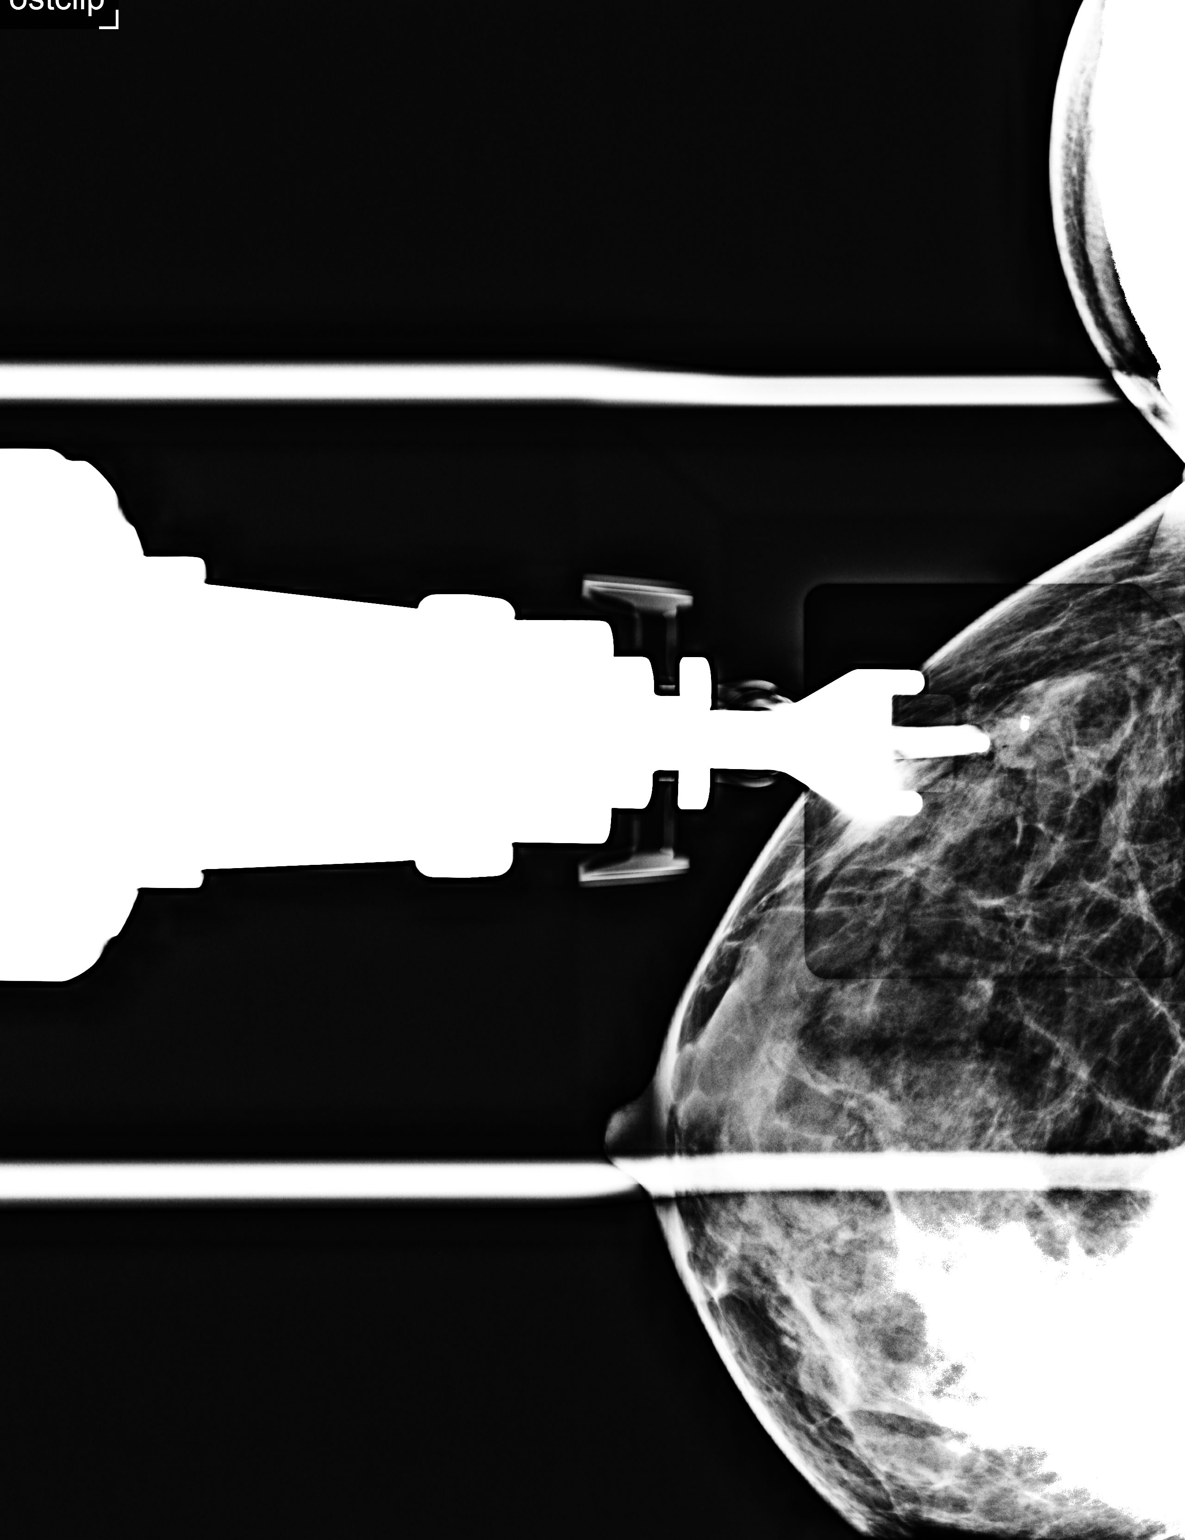

[R CC (2 of 3)]
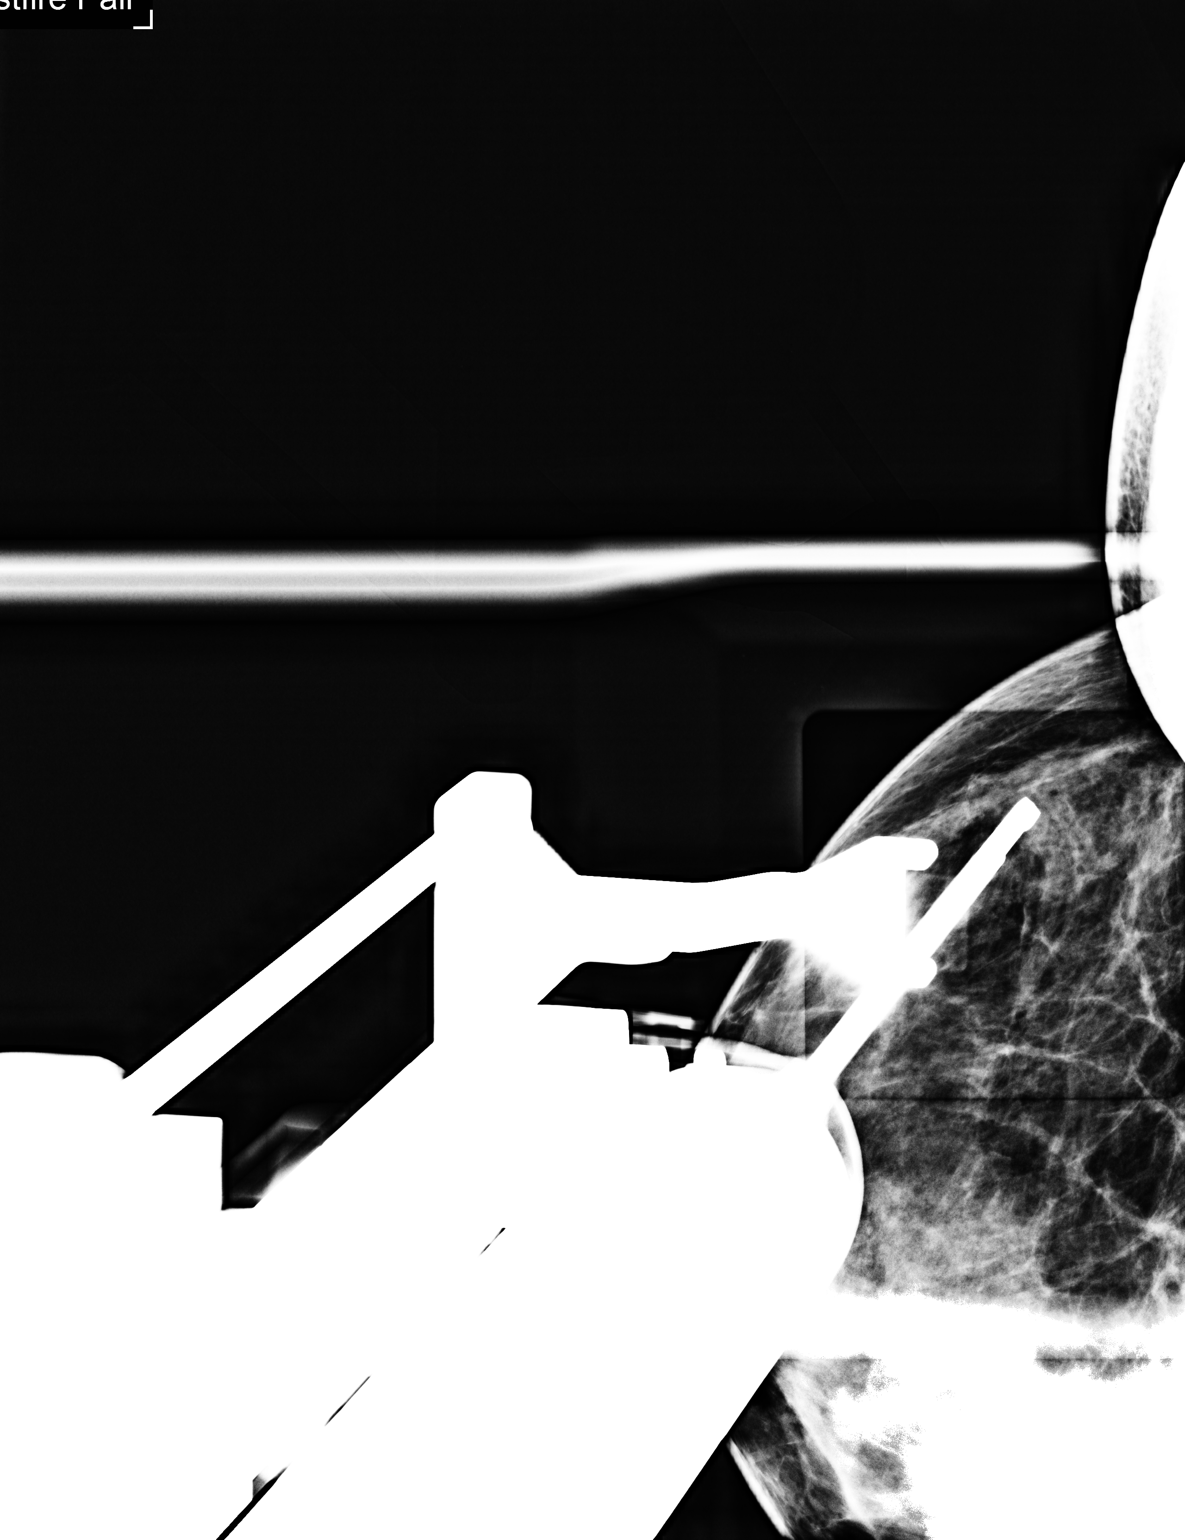

[R CC (3 of 3)]
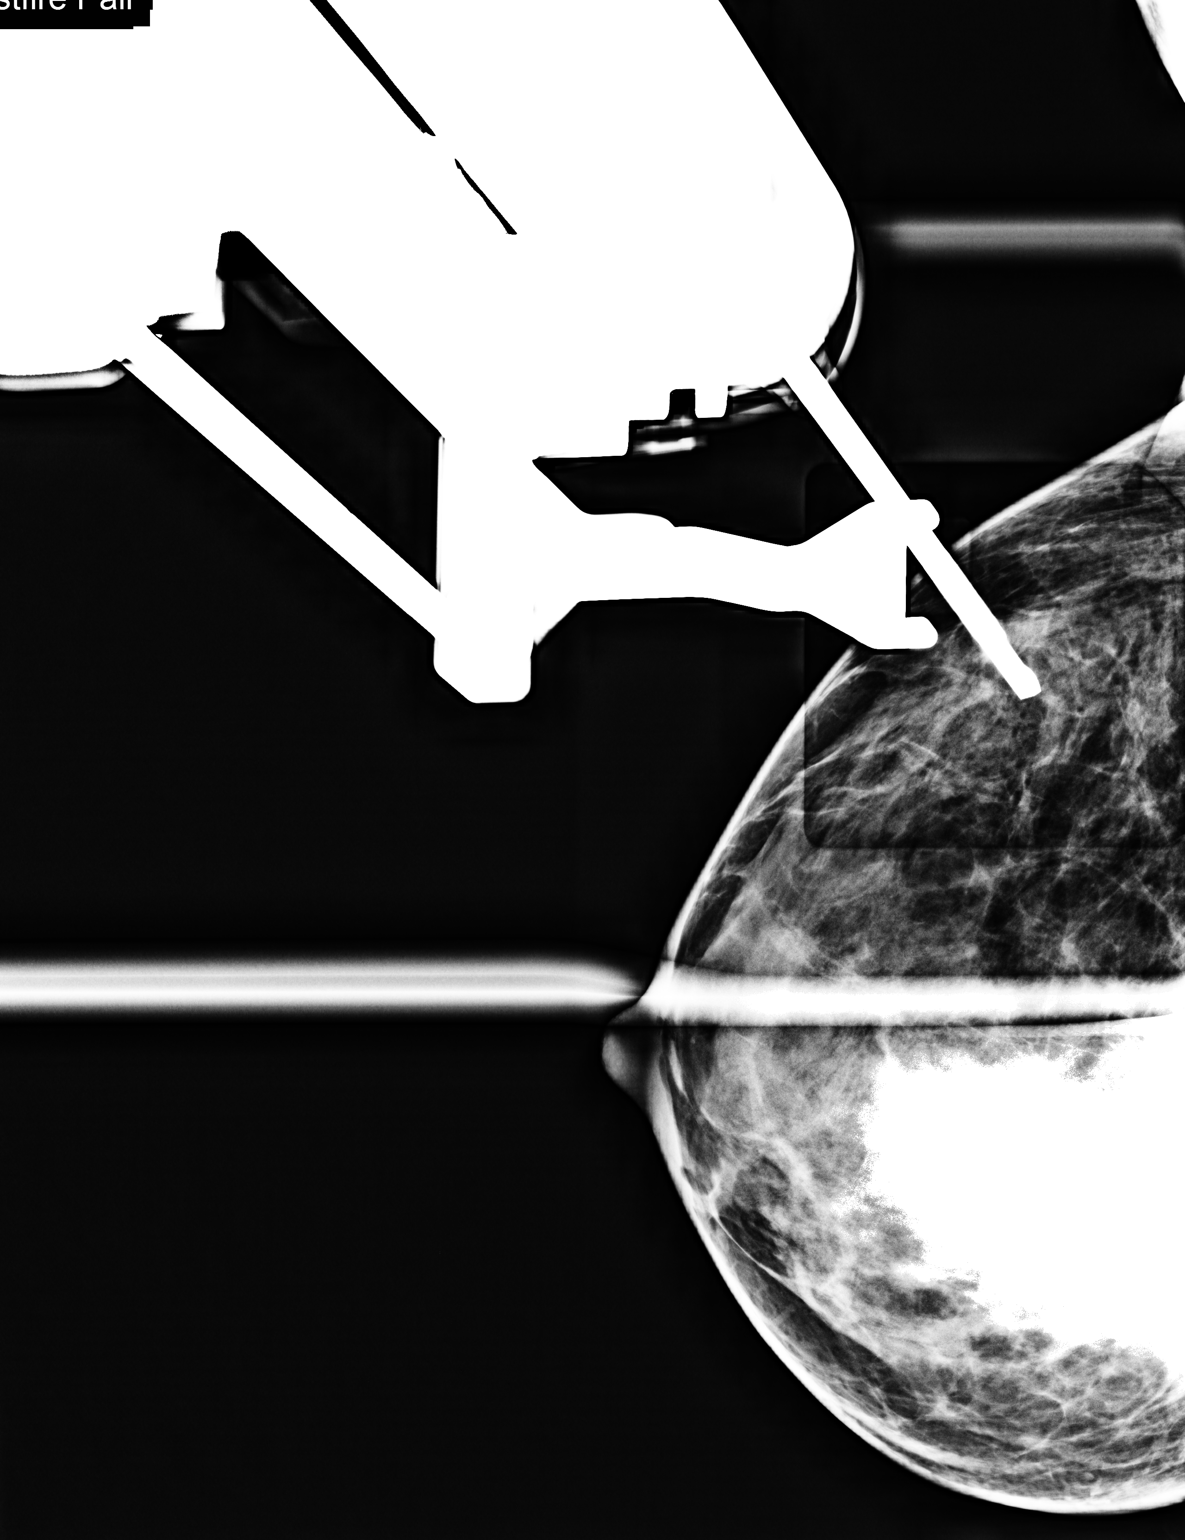

[3 of 3 positions shown; findings below may reference images not displayed]



Using sterile technique and 1% Lidocaine as local anesthetic, under
stereotactic guidance, a 9 gauge vacuum assisted device was used to
perform core needle biopsy of distortion in the upper-outer quadrant
of the right breast using a superior to inferior approach.

At the conclusion of the procedure, a coil shaped tissue marker clip
was deployed into the biopsy cavity. Follow-up 2-view mammogram was
performed and dictated separately.
IMPRESSION: Stereotactic-guided biopsy of the right breast. No apparent
complications.

## 2015-08-31 IMAGING — US US  BREAST BX W/ LOC DEV 1ST LESION IMG BX SPEC US GUIDE*R*
1 series · 8 of 8 positions shown · non-contrast
Comparison: Previous exam(s).

ADDENDUM:
Pathology revealed a complex sclerosing lesion/radial scar showing
fibrocystic changes with usual ductal hyperplasia, adenosis,
microcalcifications, and scattered foci of chronic inflammation in
the right breast. The right axillary lymph node biopsy was benign.
This was found to be concordant by Dr. JEAN PIERRE. Pathology
results were discussed with the patient by telephone. The patient
reported doing well after the biopsy. Post biopsy instructions and
care were reviewed and questions were answered. The patient was
encouraged to call The [REDACTED] for any
additional concerns. Surgical consultation has been arranged with
Dr. JEAN PIERRE at [REDACTED] on JEAN PIERRE

Pathology results reported by JEAN PIERRE RN, BSN on [DATE].
CLINICAL DATA: Distortion in the upper-outer quadrant of the right
breast an indeterminate right axillary lymph node.
EXAM:
ULTRASOUND GUIDED CORE NEEDLE BIOPSY OF A RIGHT AXILLARY NODE

[Series 1: us breast bx w/ loc dev 1st lesion img bx spec us  · 0.05mm/px · 8 of 8 slices shown]
[im 1/8]
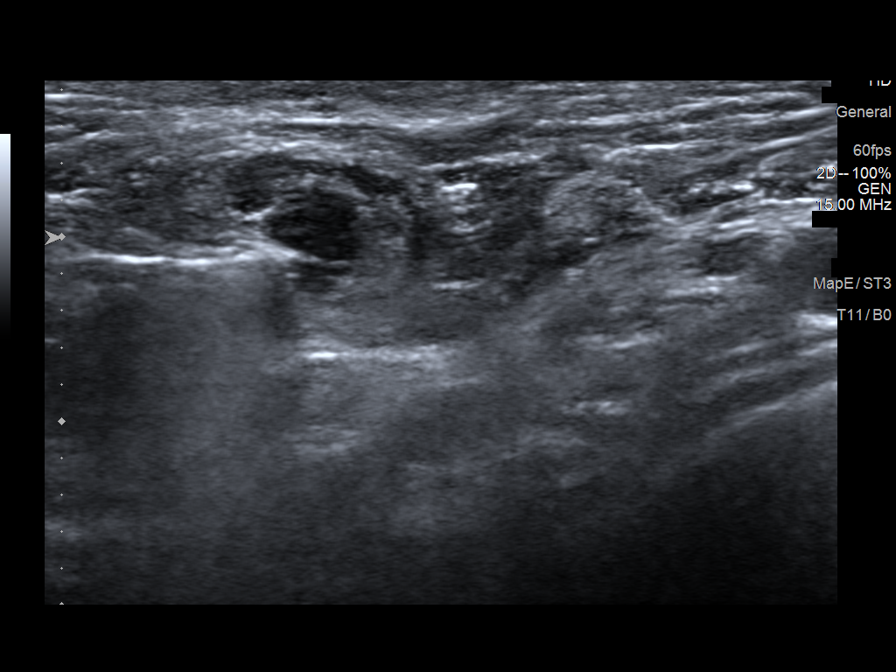
[im 2/8]
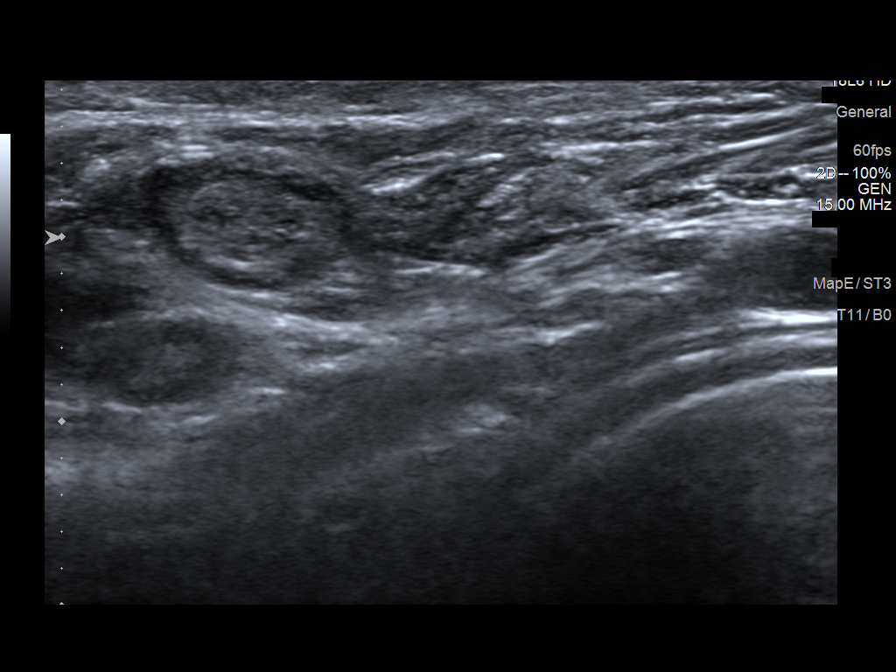
[im 3/8]
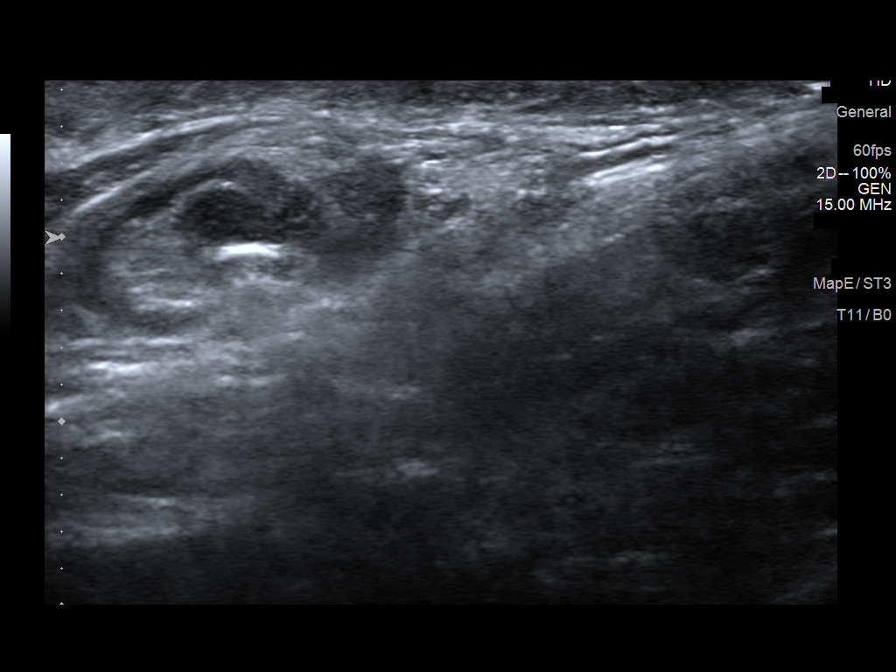
[im 4/8]
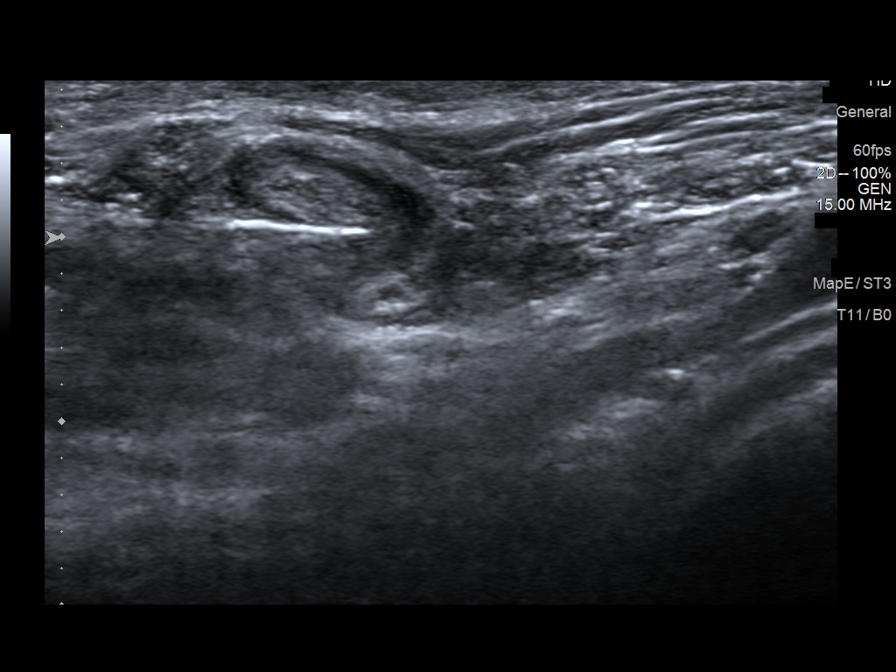
[im 5/8]
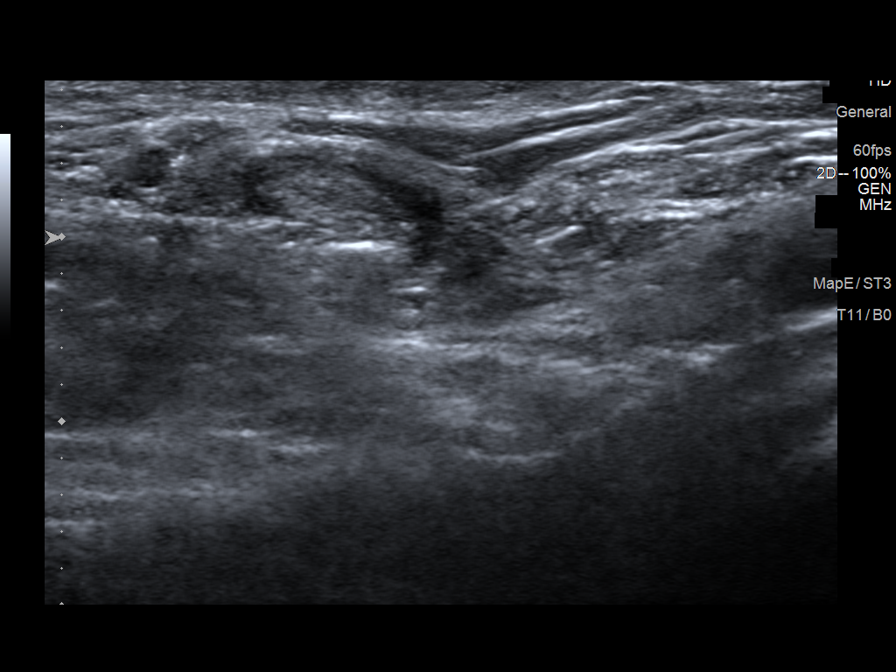
[im 6/8]
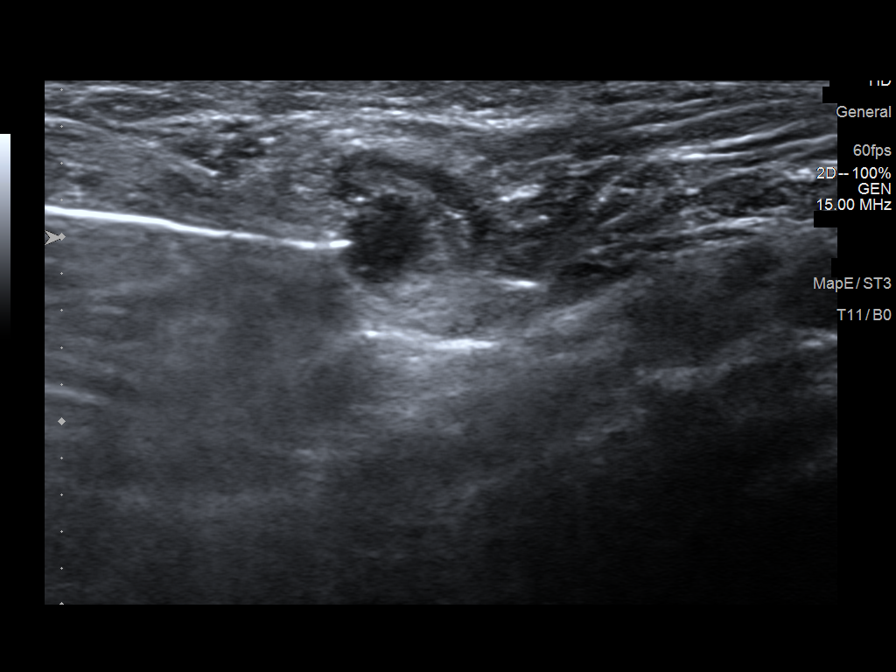
[im 7/8]
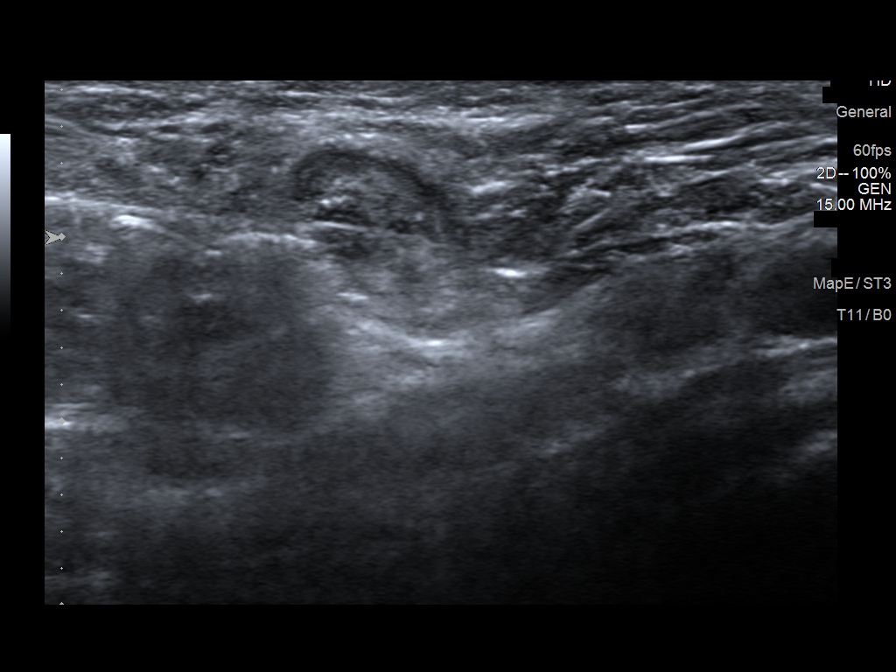
[im 8/8]
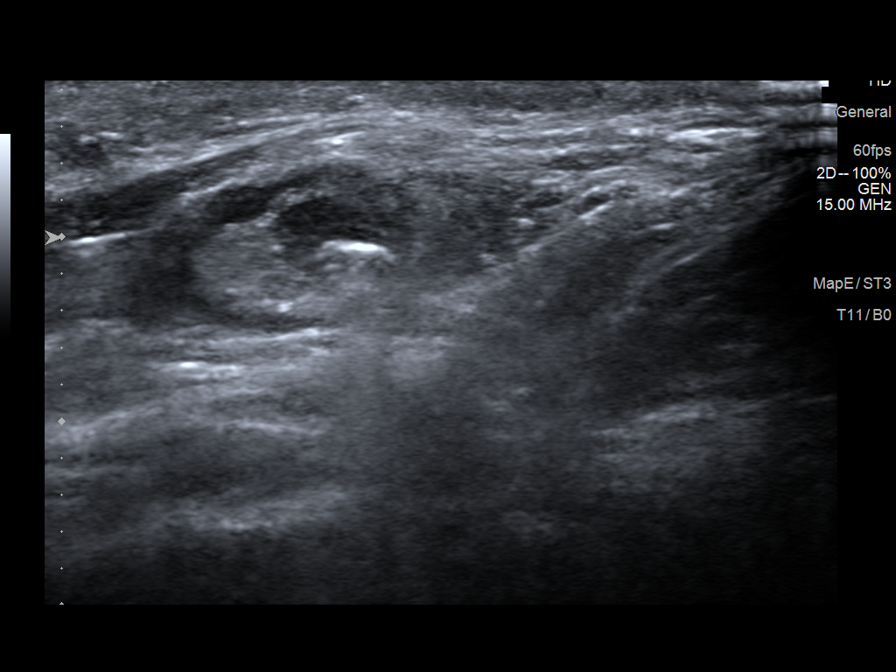

[8 of 8 positions shown; findings below may reference images not displayed]



Using sterile technique and 1% Lidocaine as local anesthetic, under
direct ultrasound visualization, a 14 gauge JEAN PIERRE device was
used to perform biopsy of a right axillary lymph node using an
inferior to superior approach. At the conclusion of the procedure a
HydroMARK tissue marker clip was deployed into the biopsy cavity.
Follow up 2 view mammogram was performed and dictated separately.
IMPRESSION: Ultrasound guided biopsy of the right axilla. No apparent
complications.

## 2015-09-13 ENCOUNTER — Other Ambulatory Visit: Payer: Self-pay | Admitting: General Surgery

## 2015-09-13 DIAGNOSIS — N6021 Fibroadenosis of right breast: Secondary | ICD-10-CM

## 2015-09-13 DIAGNOSIS — R928 Other abnormal and inconclusive findings on diagnostic imaging of breast: Secondary | ICD-10-CM

## 2015-09-19 ENCOUNTER — Ambulatory Visit
Admission: RE | Admit: 2015-09-19 | Discharge: 2015-09-19 | Disposition: A | Discharge status: Home / Self Care | Payer: Managed Care, Other (non HMO) | Source: Ambulatory Visit | Attending: General Surgery | Admitting: General Surgery

## 2015-09-19 ENCOUNTER — Encounter (HOSPITAL_BASED_OUTPATIENT_CLINIC_OR_DEPARTMENT_OTHER): Payer: Self-pay | Admitting: *Deleted

## 2015-09-19 DIAGNOSIS — N6021 Fibroadenosis of right breast: Secondary | ICD-10-CM

## 2015-09-23 ENCOUNTER — Ambulatory Visit (HOSPITAL_BASED_OUTPATIENT_CLINIC_OR_DEPARTMENT_OTHER): Payer: Managed Care, Other (non HMO) | Admitting: Anesthesiology

## 2015-09-23 ENCOUNTER — Encounter (HOSPITAL_BASED_OUTPATIENT_CLINIC_OR_DEPARTMENT_OTHER)
Admission: RE | Disposition: A | Discharge status: Home / Self Care | Payer: Self-pay | Source: Ambulatory Visit | Attending: General Surgery

## 2015-09-23 ENCOUNTER — Ambulatory Visit (HOSPITAL_BASED_OUTPATIENT_CLINIC_OR_DEPARTMENT_OTHER)
Admission: RE | Admit: 2015-09-23 | Discharge: 2015-09-23 | Disposition: A | Discharge status: Home / Self Care | Payer: Managed Care, Other (non HMO) | Source: Ambulatory Visit | Attending: General Surgery | Admitting: General Surgery

## 2015-09-23 ENCOUNTER — Encounter (HOSPITAL_BASED_OUTPATIENT_CLINIC_OR_DEPARTMENT_OTHER): Payer: Self-pay | Admitting: Anesthesiology

## 2015-09-23 ENCOUNTER — Ambulatory Visit
Admission: RE | Admit: 2015-09-23 | Discharge: 2015-09-23 | Disposition: A | Discharge status: Home / Self Care | Payer: Managed Care, Other (non HMO) | Source: Ambulatory Visit | Attending: General Surgery | Admitting: General Surgery

## 2015-09-23 DIAGNOSIS — K219 Gastro-esophageal reflux disease without esophagitis: Secondary | ICD-10-CM | POA: Diagnosis not present

## 2015-09-23 DIAGNOSIS — Z87891 Personal history of nicotine dependence: Secondary | ICD-10-CM | POA: Insufficient documentation

## 2015-09-23 DIAGNOSIS — N6021 Fibroadenosis of right breast: Secondary | ICD-10-CM | POA: Insufficient documentation

## 2015-09-23 DIAGNOSIS — N6489 Other specified disorders of breast: Secondary | ICD-10-CM | POA: Diagnosis present

## 2015-09-23 DIAGNOSIS — R928 Other abnormal and inconclusive findings on diagnostic imaging of breast: Secondary | ICD-10-CM

## 2015-09-23 HISTORY — DX: Gastro-esophageal reflux disease without esophagitis: K21.9

## 2015-09-23 HISTORY — DX: Unspecified lump in the right breast, unspecified quadrant: N63.10

## 2015-09-23 HISTORY — DX: Other complications of anesthesia, initial encounter: T88.59XA

## 2015-09-23 HISTORY — PX: BREAST LUMPECTOMY WITH RADIOACTIVE SEED LOCALIZATION: SHX6424

## 2015-09-23 HISTORY — DX: Adverse effect of unspecified anesthetic, initial encounter: T41.45XA

## 2015-09-23 HISTORY — PX: BREAST EXCISIONAL BIOPSY: SUR124

## 2015-09-23 SURGERY — BREAST LUMPECTOMY WITH RADIOACTIVE SEED LOCALIZATION
Anesthesia: General | Site: Breast | Laterality: Right

## 2015-09-23 MED ORDER — OXYCODONE-ACETAMINOPHEN 5-325 MG PO TABS: 1.0000 | ORAL_TABLET | ORAL | Status: DC | PRN

## 2015-09-23 MED ORDER — OXYCODONE HCL 5 MG PO TABS: 5.0000 mg | ORAL_TABLET | Freq: Once | ORAL | Status: DC | PRN

## 2015-09-23 MED ORDER — BUPIVACAINE HCL (PF) 0.25 % IJ SOLN
INTRAMUSCULAR | Status: DC | PRN
  Administered 2015-09-23: 20 mL

## 2015-09-23 MED ORDER — SCOPOLAMINE 1 MG/3DAYS TD PT72: 1.0000 | MEDICATED_PATCH | Freq: Once | TRANSDERMAL | Status: DC | PRN

## 2015-09-23 MED ORDER — HYDROMORPHONE HCL 1 MG/ML IJ SOLN
0.2500 mg | INTRAMUSCULAR | Status: DC | PRN
  Administered 2015-09-23: 0.25 mg via INTRAVENOUS
  Administered 2015-09-23: 0.5 mg via INTRAVENOUS

## 2015-09-23 MED ORDER — CEFAZOLIN SODIUM-DEXTROSE 2-3 GM-% IV SOLR
2.0000 g | INTRAVENOUS | Status: AC
  Administered 2015-09-23: 2 g via INTRAVENOUS

## 2015-09-23 MED ORDER — LIDOCAINE HCL (CARDIAC) 20 MG/ML IV SOLN
INTRAVENOUS | Status: DC | PRN
  Administered 2015-09-23: 70 mg via INTRAVENOUS

## 2015-09-23 MED ORDER — OXYCODONE HCL 5 MG/5ML PO SOLN: 5.0000 mg | Freq: Once | ORAL | Status: DC | PRN

## 2015-09-23 MED ORDER — FENTANYL CITRATE (PF) 100 MCG/2ML IJ SOLN
50.0000 ug | INTRAMUSCULAR | Status: DC | PRN
  Administered 2015-09-23: 100 ug via INTRAVENOUS

## 2015-09-23 MED ORDER — CHLORHEXIDINE GLUCONATE 4 % EX LIQD: 1.0000 "application " | Freq: Once | CUTANEOUS | Status: DC

## 2015-09-23 MED ORDER — GLYCOPYRROLATE 0.2 MG/ML IJ SOLN: 0.2000 mg | Freq: Once | INTRAMUSCULAR | Status: DC | PRN

## 2015-09-23 MED ORDER — PROPOFOL 10 MG/ML IV BOLUS
INTRAVENOUS | Status: DC | PRN
  Administered 2015-09-23: 200 mg via INTRAVENOUS

## 2015-09-23 MED ORDER — ONDANSETRON HCL 4 MG/2ML IJ SOLN
INTRAMUSCULAR | Status: AC
  Filled 2015-09-23: qty 2

## 2015-09-23 MED ORDER — FENTANYL CITRATE (PF) 100 MCG/2ML IJ SOLN
INTRAMUSCULAR | Status: AC
Start: 2015-09-23 — End: 2015-09-23
  Filled 2015-09-23: qty 2

## 2015-09-23 MED ORDER — MIDAZOLAM HCL 2 MG/2ML IJ SOLN
1.0000 mg | INTRAMUSCULAR | Status: DC | PRN
  Administered 2015-09-23: 2 mg via INTRAVENOUS

## 2015-09-23 MED ORDER — DEXAMETHASONE SODIUM PHOSPHATE 10 MG/ML IJ SOLN
INTRAMUSCULAR | Status: AC
  Filled 2015-09-23: qty 1

## 2015-09-23 MED ORDER — DEXAMETHASONE SODIUM PHOSPHATE 4 MG/ML IJ SOLN
INTRAMUSCULAR | Status: DC | PRN
  Administered 2015-09-23: 10 mg via INTRAVENOUS

## 2015-09-23 MED ORDER — MIDAZOLAM HCL 2 MG/2ML IJ SOLN
INTRAMUSCULAR | Status: AC
  Filled 2015-09-23: qty 2

## 2015-09-23 MED ORDER — CEFAZOLIN SODIUM-DEXTROSE 2-3 GM-% IV SOLR
INTRAVENOUS | Status: AC
  Filled 2015-09-23: qty 50

## 2015-09-23 MED ORDER — LACTATED RINGERS IV SOLN
INTRAVENOUS | Status: DC
  Administered 2015-09-23 (×2): via INTRAVENOUS

## 2015-09-23 MED ORDER — MEPERIDINE HCL 25 MG/ML IJ SOLN: 6.2500 mg | INTRAMUSCULAR | Status: DC | PRN

## 2015-09-23 MED ORDER — HYDROMORPHONE HCL 1 MG/ML IJ SOLN
INTRAMUSCULAR | Status: AC
  Filled 2015-09-23: qty 1

## 2015-09-23 SURGICAL SUPPLY — 41 items
APPLIER CLIP 9.375 MED OPEN (MISCELLANEOUS)
BLADE SURG 15 STRL LF DISP TIS (BLADE) ×1 IMPLANT
BLADE SURG 15 STRL SS (BLADE) ×2
CANISTER SUC SOCK COL 7IN (MISCELLANEOUS) IMPLANT
CANISTER SUCT 1200ML W/VALVE (MISCELLANEOUS) IMPLANT
CHLORAPREP W/TINT 26ML (MISCELLANEOUS) ×3 IMPLANT
CLIP APPLIE 9.375 MED OPEN (MISCELLANEOUS) IMPLANT
COVER BACK TABLE 60X90IN (DRAPES) ×3 IMPLANT
COVER MAYO STAND STRL (DRAPES) ×3 IMPLANT
COVER PROBE W GEL 5X96 (DRAPES) ×3 IMPLANT
DECANTER SPIKE VIAL GLASS SM (MISCELLANEOUS) IMPLANT
DEVICE DUBIN W/COMP PLATE 8390 (MISCELLANEOUS) ×3 IMPLANT
DRAPE LAPAROSCOPIC ABDOMINAL (DRAPES) ×3 IMPLANT
DRAPE UTILITY XL STRL (DRAPES) ×3 IMPLANT
ELECT COATED BLADE 2.86 ST (ELECTRODE) ×3 IMPLANT
ELECT REM PT RETURN 9FT ADLT (ELECTROSURGICAL) ×3
ELECTRODE REM PT RTRN 9FT ADLT (ELECTROSURGICAL) ×1 IMPLANT
GLOVE BIOGEL PI IND STRL 7.0 (GLOVE) ×1 IMPLANT
GLOVE BIOGEL PI INDICATOR 7.0 (GLOVE) ×2
GLOVE EXAM NITRILE EXT CUFF MD (GLOVE) ×3 IMPLANT
GLOVE SURG SS PI 6.5 STRL IVOR (GLOVE) ×3 IMPLANT
GLOVE SURG SS PI 7.5 STRL IVOR (GLOVE) ×6 IMPLANT
GOWN STRL REUS W/ TWL LRG LVL3 (GOWN DISPOSABLE) ×2 IMPLANT
GOWN STRL REUS W/TWL LRG LVL3 (GOWN DISPOSABLE) ×4
KIT MARKER MARGIN INK (KITS) ×3 IMPLANT
LIQUID BAND (GAUZE/BANDAGES/DRESSINGS) ×3 IMPLANT
NEEDLE HYPO 25X1 1.5 SAFETY (NEEDLE) ×3 IMPLANT
NS IRRIG 1000ML POUR BTL (IV SOLUTION) ×3 IMPLANT
PACK BASIN DAY SURGERY FS (CUSTOM PROCEDURE TRAY) ×3 IMPLANT
PENCIL BUTTON HOLSTER BLD 10FT (ELECTRODE) ×3 IMPLANT
SLEEVE SCD COMPRESS KNEE MED (MISCELLANEOUS) ×3 IMPLANT
SPONGE LAP 18X18 X RAY DECT (DISPOSABLE) ×3 IMPLANT
SUT MON AB 4-0 PC3 18 (SUTURE) ×3 IMPLANT
SUT SILK 2 0 SH (SUTURE) IMPLANT
SUT VICRYL 3-0 CR8 SH (SUTURE) ×3 IMPLANT
SYR CONTROL 10ML LL (SYRINGE) ×3 IMPLANT
TOWEL OR 17X24 6PK STRL BLUE (TOWEL DISPOSABLE) ×3 IMPLANT
TOWEL OR NON WOVEN STRL DISP B (DISPOSABLE) ×3 IMPLANT
TUBE CONNECTING 20'X1/4 (TUBING)
TUBE CONNECTING 20X1/4 (TUBING) IMPLANT
YANKAUER SUCT BULB TIP NO VENT (SUCTIONS) IMPLANT

## 2015-09-23 NOTE — Anesthesia Preprocedure Evaluation (Signed)
Anesthesia Evaluation  Patient identified by MRN, date of birth, ID band Patient awake    Reviewed: Allergy & Precautions, NPO status , Patient's Chart, lab work & pertinent test results  Airway Mallampati: I  TM Distance: >3 FB Neck ROM: Full    Dental  (+) Teeth Intact, Dental Advisory Given   Pulmonary  breath sounds clear to auscultation        Cardiovascular Rhythm:Regular Rate:Normal     Neuro/Psych    GI/Hepatic GERD-  Medicated and Controlled,  Endo/Other    Renal/GU      Musculoskeletal   Abdominal   Peds  Hematology   Anesthesia Other Findings   Reproductive/Obstetrics                             Anesthesia Physical Anesthesia Plan  ASA: I  Anesthesia Plan: General   Post-op Pain Management:    Induction: Intravenous  Airway Management Planned: LMA  Additional Equipment:   Intra-op Plan:   Post-operative Plan: Extubation in OR  Informed Consent: I have reviewed the patients History and Physical, chart, labs and discussed the procedure including the risks, benefits and alternatives for the proposed anesthesia with the patient or authorized representative who has indicated his/her understanding and acceptance.   Dental advisory given  Plan Discussed with: CRNA, Anesthesiologist and Surgeon  Anesthesia Plan Comments:         Anesthesia Quick Evaluation  

## 2015-09-23 NOTE — Discharge Instructions (Signed)

## 2015-09-23 NOTE — Transfer of Care (Signed)
Immediate Anesthesia Transfer of Care Note  Patient: Alison DroughtGillian S Morad  Procedure(s) Performed: Procedure(s): BREAST LUMPECTOMY WITH RADIOACTIVE SEED LOCALIZATION (Right)  Patient Location: PACU  Anesthesia Type:General  Level of Consciousness: sedated  Airway & Oxygen Therapy: Patient Spontanous Breathing and Patient connected to face mask oxygen  Post-op Assessment: Report given to RN and Post -op Vital signs reviewed and stable  Post vital signs: Reviewed and stable  Last Vitals:  Filed Vitals:   09/23/15 1340 09/23/15 1345  BP:  106/69  Pulse: 59 58  Temp:    Resp: 12 9    Complications: No apparent anesthesia complications

## 2015-09-23 NOTE — Op Note (Signed)
09/23/2015  1:35 PM  PATIENT:  [name]  50 y.o. female  PRE-OPERATIVE DIAGNOSIS:  Right breast complex sclerosing lesion   POST-OPERATIVE DIAGNOSIS:  Right breast complex sclerosing lesion   PROCEDURE:  Procedure(s): BREAST LUMPECTOMY WITH RADIOACTIVE SEED LOCALIZATION (Right)  SURGEON:  Surgeon(s) and Role:    * Griselda MinerPaul Toth III, MD - Primary  PHYSICIAN ASSISTANT:   ASSISTANTS: none   ANESTHESIA:   general  EBL:  Total I/O In: 1100 [I.V.:1100] Out: 5 [Blood:5]  BLOOD ADMINISTERED:none  DRAINS: none   LOCAL MEDICATIONS USED:  MARCAINE     SPECIMEN:  Source of Specimen:  right breast tissue  DISPOSITION OF SPECIMEN:  PATHOLOGY  COUNTS:  YES  TOURNIQUET:  * No tourniquets in log *  DICTATION: .Dragon Dictation   After informed consent was obtained the patient was brought to the operating room and placed in the supine position on the operating room table. After adequate induction of general anesthesia the patient's right breast was prepped with ChloraPrep, allowed to dry, and draped in usual sterile manner. An appropriate timeout was performed. Previously an I-125 seed was placed in the upper outer quadrant of the right breast marked an area of a complex sclerosing lesion. The neoprobe was set to I-125 in the area of radioactivity was readily identified and the upper outer aspect of the right breast. A curvilinear incision was made with a 15 blade knife near the lateral edge of the breast close to the axilla. The incision was carried through the skin and subcutaneous tissue sharply with electrocautery. The dissection was then carried towards the radioactive seed. While checking the area of radioactivity frequently with the neoprobe a circular portion of breast tissue was excised sharply around the radioactive seed. Once the specimen was removed it was oriented with the appropriate paint colors. A specimen radiograph was obtained that showed the clip and seed to be in the  center of the specimen. The specimen was then sent to pathology for further evaluation. Clinically the tissues looked very similar to fibrocystic disease. The wound was infiltrated with quarter percent Marcaine and irrigated with saline. The deep layer of the wound was then closed in layers of interrupted 3-0 Vicryl stitches. An was then closed with interrupted 4-0 Monocryl subcuticular stitches. Dermabond dressings were applied. The patient tolerated procedure well. At the end of the case all needle sponge and instrument counts were correct. The patient was then awakened and taken to recovery in stable condition.  PLAN OF CARE: Discharge to home after PACU  PATIENT DISPOSITION:  PACU - hemodynamically stable.   Delay start of Pharmacological VTE agent (>24hrs) due to surgical blood loss or risk of bleeding: not applicable

## 2015-09-23 NOTE — Anesthesia Procedure Notes (Signed)
Procedure Name: LMA Insertion Date/Time: 09/23/2015 12:25 PM Performed by: Burna CashONRAD, Melika Reder C Pre-anesthesia Checklist: Patient identified, Emergency Drugs available, Suction available and Patient being monitored Patient Re-evaluated:Patient Re-evaluated prior to inductionOxygen Delivery Method: Circle System Utilized Preoxygenation: Pre-oxygenation with 100% oxygen Intubation Type: IV induction Ventilation: Mask ventilation without difficulty LMA: LMA inserted LMA Size: 4.0 Number of attempts: 1 Airway Equipment and Method: Bite block Placement Confirmation: positive ETCO2 Tube secured with: Tape Dental Injury: Teeth and Oropharynx as per pre-operative assessment

## 2015-09-23 NOTE — H&P (Signed)
Alcario DroughtGillian S. Gaber  [facility] Patient #: [id number] DOB: 1966-06-14 Married / Language: English / Race: White Female   History of Present Illness  Patient words: breast.  The patient is a 50 year old female who presents with a breast mass. We are asked to see the patient in consultation by Dr.Arceo to evaluate her for a complex sclerosing lesion of the right breast. The patient is a 50 year old white female who recently went for a routine screening mammogram. At that time she was found to have an area of architectural distortion in the outer aspect of the right breast measuring 1.5 cm. This was biopsied and came back as a complex sclerosing lesion. She does have a history of breast cysts that were followed for several years. She occasionally has pain in both breasts. She denies any discharge from the nipple. She does not take any hormone replacement. She has no family history of breast cancer.   Other Problems  Back Pain Bladder Problems Gastroesophageal Reflux Disease General anesthesia - complications Other disease, cancer, significant illness Seizure Disorder Umbilical Hernia Repair  Past Surgical History  Breast Biopsy Right. Foot Surgery Bilateral. Hysterectomy (not due to cancer) - Partial Oral Surgery Shoulder Surgery Right.  Diagnostic Studies History Colonoscopy within last year Mammogram within last year Pap Smear >5 years ago  Allergies  Latex Exam Gloves *MEDICAL DEVICES AND SUPPLIES* Sulfa Antibiotics  Medication History  Omeprazole (10MG  Capsule DR, Oral) Active. Restasis (0.05% Emulsion, Ophthalmic) Active. Multiple Vitamin (Oral) Active. Tretinoin (Emollient) (0.02% Cream, External) Active. Flax Seed Oil (1000MG  Capsule, Oral) Active. Medications Reconciled  Social History  Alcohol use Moderate alcohol use. Caffeine use Carbonated beverages, Coffee. No drug use Tobacco use Former  smoker.  Family History  Alcohol Abuse Father. Anesthetic complications Family Members In General. Arthritis Family Members In General. Cancer Mother. Cerebrovascular Accident Family Members In General. Depression Sister. Diabetes Mellitus Family Members In General. Heart Disease Family Members In General. Hypertension Mother, Sister. Kidney Disease Sister. Migraine Headache Family Members In General. Respiratory Condition Family Members In General. Seizure disorder Family Members In General.  Pregnancy / Birth History  Age at menarche 14 years. Contraceptive History Oral contraceptives. Gravida 1 Irregular periods Maternal age 51-40 Para 1    Review of Systems  General Not Present- Appetite Loss, Chills, Fatigue, Fever, Night Sweats, Weight Gain and Weight Loss. HEENT Present- Oral Ulcers, Seasonal Allergies and Wears glasses/contact lenses. Not Present- Earache, Hearing Loss, Hoarseness, Nose Bleed, Ringing in the Ears, Sinus Pain, Sore Throat, Visual Disturbances and Yellow Eyes. Respiratory Present- Snoring. Not Present- Bloody sputum, Chronic Cough, Difficulty Breathing and Wheezing. Breast Present- Breast Mass. Not Present- Breast Pain, Nipple Discharge and Skin Changes. Cardiovascular Not Present- Chest Pain, Difficulty Breathing Lying Down, Leg Cramps, Palpitations, Rapid Heart Rate, Shortness of Breath and Swelling of Extremities. Gastrointestinal Not Present- Abdominal Pain, Bloating, Bloody Stool, Change in Bowel Habits, Chronic diarrhea, Constipation, Difficulty Swallowing, Excessive gas, Gets full quickly at meals, Hemorrhoids, Indigestion, Nausea, Rectal Pain and Vomiting. Female Genitourinary Not Present- Frequency, Nocturia, Painful Urination, Pelvic Pain and Urgency. Musculoskeletal Not Present- Back Pain, Joint Pain, Joint Stiffness, Muscle Pain, Muscle Weakness and Swelling of Extremities. Neurological Not Present- Decreased Memory, Fainting,  Headaches, Numbness, Seizures, Tingling, Tremor, Trouble walking and Weakness. Psychiatric Not Present- Anxiety, Bipolar, Change in Sleep Pattern, Depression, Fearful and Frequent crying. Endocrine Not Present- Cold Intolerance, Excessive Hunger, Hair Changes, Heat Intolerance, Hot flashes and New Diabetes. Hematology Not Present- Easy Bruising, Excessive bleeding, Gland problems,  HIV and Persistent Infections.  Vitals  Weight: 141 lb Height: 66in Body Surface Area: 1.72 m Body Mass Index: 22.76 kg/m  Temp.: 98.29F(Oral)  Pulse: 72 (Regular)  BP: 132/68 (Sitting, Left Arm, Standard)       Physical Exam  General Mental Status-Alert. General Appearance-Consistent with stated age. Hydration-Well hydrated. Voice-Normal.  Head and Neck Head-normocephalic, atraumatic with no lesions or palpable masses. Trachea-midline. Thyroid Gland Characteristics - normal size and consistency.  Eye Eyeball - Bilateral-Extraocular movements intact. Sclera/Conjunctiva - Bilateral-No scleral icterus.  Chest and Lung Exam Chest and lung exam reveals -quiet, even and easy respiratory effort with no use of accessory muscles and on auscultation, normal breath sounds, no adventitious sounds and normal vocal resonance. Inspection Chest Wall - Normal. Back - normal.  Breast Note: There is dense nodular symmetric breast tissue in the outer aspect of both breasts. There is no discrete palpable mass in either breast. There is no palpable axillary, supra clavicular, or cervical lymphadenopathy.   Cardiovascular Cardiovascular examination reveals -normal heart sounds, regular rate and rhythm with no murmurs and normal pedal pulses bilaterally.  Abdomen Inspection Inspection of the abdomen reveals - No Hernias. Skin - Scar - no surgical scars. Palpation/Percussion Palpation and Percussion of the abdomen reveal - Soft, Non Tender, No Rebound tenderness, No Rigidity  (guarding) and No hepatosplenomegaly. Auscultation Auscultation of the abdomen reveals - Bowel sounds normal.  Neurologic Neurologic evaluation reveals -alert and oriented x 3 with no impairment of recent or remote memory. Mental Status-Normal.  Musculoskeletal Normal Exam - Left-Upper Extremity Strength Normal and Lower Extremity Strength Normal. Normal Exam - Right-Upper Extremity Strength Normal and Lower Extremity Strength Normal.  Lymphatic Head & Neck  General Head & Neck Lymphatics: Bilateral - Description - Normal. Axillary  General Axillary Region: Bilateral - Description - Normal. Tenderness - Non Tender. Femoral & Inguinal  Generalized Femoral & Inguinal Lymphatics: Bilateral - Description - Normal. Tenderness - Non Tender.    Assessment & Plan  SCLEROSING ADENOSIS OF BREAST, RIGHT (N60.21) Impression: The patient appears to have a complex sclerosing lesion in the outer aspect of the right breast. Because of its appearance on mammogram and because it can be considered a high risk lesion I would recommend having this area removed. I have discussed with her in detail the risks and benefits of the operation to remove this area as well as some of the technical aspects and she understands and wishes to proceed. I will plan for a right breast radioactive seed localized lumpectomy. Current Plans Pt Education - Breast Diseases: discussed with patient and provided information.   Signed by Caleen Essex, MD

## 2015-09-23 NOTE — Interval H&P Note (Signed)
History and Physical Interval Note:  09/23/2015 9:36 AM  [name]  has presented today for surgery, with the diagnosis of Right breast complex sclerosing lesion   The various methods of treatment have been discussed with the patient and family. After consideration of risks, benefits and other options for treatment, the patient has consented to  Procedure(s): BREAST LUMPECTOMY WITH RADIOACTIVE SEED LOCALIZATION (Right) as a surgical intervention .  The patient's history has been reviewed, patient examined, no change in status, stable for surgery.  I have reviewed the patient's chart and labs.  Questions were answered to the patient's satisfaction.     TOTH III,Lamone Ferrelli S

## 2015-09-23 NOTE — Anesthesia Postprocedure Evaluation (Signed)
Anesthesia Post Note  Patient: [name]  Procedure(s) Performed: Procedure(s) (LRB): BREAST LUMPECTOMY WITH RADIOACTIVE SEED LOCALIZATION (Right)  Patient location during evaluation: PACU Anesthesia Type: General Level of consciousness: awake and alert Pain management: pain level controlled Vital Signs Assessment: post-procedure vital signs reviewed and stable Respiratory status: spontaneous breathing, nonlabored ventilation and respiratory function stable Cardiovascular status: blood pressure returned to baseline and stable Postop Assessment: no signs of nausea or vomiting Anesthetic complications: no    Last Vitals:  Filed Vitals:   09/23/15 1340 09/23/15 1345  BP:  106/69  Pulse: 59 58  Temp:    Resp: 12 9    Last Pain:  Filed Vitals:   09/23/15 1402  PainSc: 2                  Kyrel Leighton A

## 2015-09-26 ENCOUNTER — Encounter (HOSPITAL_BASED_OUTPATIENT_CLINIC_OR_DEPARTMENT_OTHER): Payer: Self-pay | Admitting: General Surgery

## 2015-11-13 ENCOUNTER — Emergency Department (HOSPITAL_COMMUNITY)
Admission: EM | Admit: 2015-11-13 | Discharge: 2015-11-13 | Disposition: A | Discharge status: Home / Self Care | Payer: Managed Care, Other (non HMO) | Attending: Emergency Medicine | Admitting: Emergency Medicine

## 2015-11-13 ENCOUNTER — Encounter (HOSPITAL_COMMUNITY): Payer: Self-pay | Admitting: *Deleted

## 2015-11-13 DIAGNOSIS — Z79899 Other long term (current) drug therapy: Secondary | ICD-10-CM | POA: Diagnosis not present

## 2015-11-13 DIAGNOSIS — Z9011 Acquired absence of right breast and nipple: Secondary | ICD-10-CM | POA: Diagnosis not present

## 2015-11-13 DIAGNOSIS — N12 Tubulo-interstitial nephritis, not specified as acute or chronic: Secondary | ICD-10-CM | POA: Diagnosis not present

## 2015-11-13 DIAGNOSIS — N390 Urinary tract infection, site not specified: Secondary | ICD-10-CM | POA: Diagnosis not present

## 2015-11-13 DIAGNOSIS — K219 Gastro-esophageal reflux disease without esophagitis: Secondary | ICD-10-CM | POA: Diagnosis not present

## 2015-11-13 DIAGNOSIS — R109 Unspecified abdominal pain: Secondary | ICD-10-CM

## 2015-11-13 DIAGNOSIS — Z91040 Latex allergy status: Secondary | ICD-10-CM | POA: Diagnosis not present

## 2015-11-13 DIAGNOSIS — R300 Dysuria: Secondary | ICD-10-CM | POA: Diagnosis present

## 2015-11-13 DIAGNOSIS — Z8742 Personal history of other diseases of the female genital tract: Secondary | ICD-10-CM | POA: Diagnosis not present

## 2015-11-13 LAB — URINE MICROSCOPIC-ADD ON: SQUAMOUS EPITHELIAL / LPF: NONE SEEN

## 2015-11-13 LAB — URINALYSIS, ROUTINE W REFLEX MICROSCOPIC
Bilirubin Urine: NEGATIVE
Glucose, UA: NEGATIVE mg/dL
Ketones, ur: NEGATIVE mg/dL
Nitrite: NEGATIVE
Protein, ur: NEGATIVE mg/dL
SPECIFIC GRAVITY, URINE: 1.008 (ref 1.005–1.030)
pH: 6 (ref 5.0–8.0)

## 2015-11-13 MED ORDER — DEXTROSE 5 % IV SOLN: 1.0000 g | Freq: Once | INTRAVENOUS | Status: DC

## 2015-11-13 MED ORDER — LIDOCAINE HCL (PF) 1 % IJ SOLN
INTRAMUSCULAR | Status: AC
  Administered 2015-11-13: 2 mL
  Filled 2015-11-13: qty 5

## 2015-11-13 MED ORDER — CEFTRIAXONE SODIUM 1 G IJ SOLR
1.0000 g | Freq: Once | INTRAMUSCULAR | Status: AC
  Administered 2015-11-13: 1 g via INTRAMUSCULAR
  Filled 2015-11-13: qty 10

## 2015-11-13 MED ORDER — CEPHALEXIN 500 MG PO CAPS: 500.0000 mg | ORAL_CAPSULE | Freq: Three times a day (TID) | ORAL | Status: DC

## 2015-11-13 NOTE — ED Notes (Signed)
Pt complains of right back pain headache since yesterday. Pt tried ibuprofen and tylenol, which she states helped. Pt states her urine has appeared cloudy and is malodorous. Pt states it is painful towards the end of urination and has an urgency to urinate.

## 2015-11-13 NOTE — ED Provider Notes (Signed)
CSN: [id number]     Arrival date & time 11/13/15  2116 History  By signing my name below, I, [name], attest that this documentation has been prepared under the direction and in the presence of Tesha Archambeau Camprubi-Soms, PA-C. Electronically Signed: Octavia Booker, ED Scribe. 11/13/2015. 10:19 PM.    Chief Complaint  Patient presents with  . Flank Pain  . Dysuria      Patient is a 50 y.o. female presenting with flank pain and dysuria. The history is provided by the patient. No language interpreter was used.  Flank Pain This is a new problem. The current episode started yesterday. The problem occurs constantly. The problem has been gradually worsening. Associated symptoms include headaches (mild). Pertinent negatives include no chest pain, no abdominal pain and no shortness of breath. Exacerbated by: sitting. The symptoms are relieved by NSAIDs and acetaminophen. She has tried acetaminophen for the symptoms. The treatment provided mild relief.  Dysuria Pain quality:  Aching Pain severity:  Mild Onset quality:  Sudden Duration:  1 day Timing:  Constant Progression:  Worsening Chronicity:  New Recent urinary tract infections: no   Relieved by:  Nothing Worsened by:  Nothing tried Ineffective treatments:  Acetaminophen Urinary symptoms: no hematuria and no bladder incontinence   Associated symptoms: fever (Tmax 100.5) and flank pain   Associated symptoms: no abdominal pain, no nausea, no vaginal discharge and no vomiting   Risk factors: not pregnant and no recurrent urinary tract infections    HPI Comments: [name] is a 50 y.o. female who has a PMHx of GERD and right breast mass s/p lumpectomy 09/23/15, with a PSHx of abdominal hysterectomy and bladder sling, presents to the Emergency Department complaining of sudden onset, constant, gradual worsening, moderate 7/10, non radiating, dull and aching right flank pain onset yesterday afternoon, which worsens with sitting, and  improved mildly with tylenol and motrin. She reports having an associated mild headache, chills, fever (TMax 100.5) and mild dysuria towards the end of her urinating. She notes that her urine is very cloudy and malodorous. She denies urinary urgency/frequency, hematuria, CP, SOB, abd pain, n/v/d/c, obstipation, melena, hematochezia, bladder incontinence, vaginal discharge, vaginal bleeding, numbness, tingling, weakness, vision changes, lightheadedness, or neck stiffness. No recent sick contacts or travel, no recent URI illnesses. States the headache is not the most severe of her life, and doesn't feel concerned about this symptom.  Past Medical History  Diagnosis Date  . Allergy   . GERD (gastroesophageal reflux disease)   . Complication of anesthesia     hard to wake up  . Seizures (HCC)     as teen, no meds since age 47, no seizures  . Breast mass, right    Past Surgical History  Procedure Laterality Date  . Eye surgery      lasik  . Bunionectomy Bilateral 1986  . Rotator cuff repair Right 2012  . Ureter surgery      as child  . Abdominal hysterectomy  2006    AP repair also  . Breast lumpectomy with radioactive seed localization Right 09/23/2015    Procedure: BREAST LUMPECTOMY WITH RADIOACTIVE SEED LOCALIZATION;  Surgeon: Chevis Pretty III, MD;  Location: [facility];  Service: General;  Laterality: Right;   Family History  Problem Relation Age of Onset  . Hyperlipidemia Mother   . Hypertension Mother   . Hypertension Maternal Grandfather   . Hyperlipidemia Maternal Grandfather   . Hyperlipidemia Paternal Grandmother   . Hypertension Paternal Grandmother   .  Stroke Paternal Grandmother   . Hyperlipidemia Paternal Grandfather   . Hypertension Paternal Grandfather    Social History  Substance Use Topics  . Smoking status: Never Smoker   . Smokeless tobacco: None  . Alcohol Use: 0.0 oz/week    0 Standard drinks or equivalent per week     Comment: social   OB  History    No data available     Review of Systems  Constitutional: Positive for fever (Tmax 100.5) and chills.  Eyes: Negative for visual disturbance.  Respiratory: Negative for shortness of breath.   Cardiovascular: Negative for chest pain.  Gastrointestinal: Negative for nausea, vomiting, abdominal pain, diarrhea, constipation and blood in stool.  Genitourinary: Positive for dysuria and flank pain. Negative for urgency, frequency, hematuria, vaginal bleeding and vaginal discharge.       +cloudy malodorous urine  Musculoskeletal: Negative for myalgias, arthralgias and neck stiffness.  Skin: Negative for color change.  Allergic/Immunologic: Negative for immunocompromised state.  Neurological: Positive for headaches (mild). Negative for weakness, light-headedness and numbness.  Psychiatric/Behavioral: Negative for confusion.   10 Systems reviewed and are negative for acute change except as noted in the HPI.    Allergies  Bee venom; Sulfa antibiotics; and Latex  Home Medications   Prior to Admission medications   Medication Sig Start Date End Date Taking? Authorizing Provider  acetaminophen (TYLENOL) 500 MG tablet Take 1,000 mg by mouth every 6 (six) hours as needed (for pain.).    Yes Historical Provider, MD  cycloSPORINE (RESTASIS) 0.05 % ophthalmic emulsion 1 drop 2 (two) times daily.   Yes Historical Provider, MD  Flaxseed, Linseed, (FLAX SEED OIL PO) Take 1 capsule by mouth daily.    Yes Historical Provider, MD  ibuprofen (ADVIL,MOTRIN) 200 MG tablet Take 200 mg by mouth every 6 (six) hours as needed (for pain.).   Yes Historical Provider, MD  Multiple Vitamins-Minerals (AIRBORNE PO) Take 1 tablet by mouth 3 (three) times daily as needed (for immune health).   Yes Historical Provider, MD  Multiple Vitamins-Minerals (EMERGEN-C IMMUNE PLUS PO) Take 1 packet by mouth 2 (two) times daily as needed.    Yes Historical Provider, MD  Multiple Vitamins-Minerals (MULTIVITAMIN WITH  MINERALS) tablet Take 1 tablet by mouth daily.   Yes Historical Provider, MD  NON FORMULARY Inject 1 Syringe into the muscle every 30 (thirty) days. Allergy Shot received from [facility]   Yes Historical Provider, MD  omeprazole (PRILOSEC) 20 MG capsule Take 20 mg by mouth daily.   Yes Historical Provider, MD  tretinoin (RETIN-A) 0.01 % gel Apply 1 application topically 2 (two) times a week.    Yes Historical Provider, MD  triamcinolone (KENALOG) 0.1 % paste Use as directed 1 application in the mouth or throat 2 (two) times daily as needed. For ulcers 10/13/15  Yes Historical Provider, MD  oxyCODONE-acetaminophen (ROXICET) 5-325 MG tablet Take 1-2 tablets by mouth every 4 (four) hours as needed. Patient not taking: Reported on 11/13/2015 09/23/15   Chevis Pretty III, MD   Triage vitals: BP 129/77 mmHg  Pulse 78  Temp(Src) 99.3 F (37.4 C) (Oral)  Resp 18  SpO2 98% Physical Exam  Constitutional: She is oriented to person, place, and time. Vital signs are normal. She appears well-developed and well-nourished.  Non-toxic appearance. No distress.  Low grade temp 99.3, nontoxic, NAD  HENT:  Head: Normocephalic and atraumatic.  Mouth/Throat: Oropharynx is clear and moist and mucous membranes are normal.  Eyes: Conjunctivae and EOM are normal.  Right eye exhibits no discharge. Left eye exhibits no discharge.  Neck: Normal range of motion. Neck supple.  No meningeal signs  Cardiovascular: Normal rate, regular rhythm, normal heart sounds and intact distal pulses.  Exam reveals no gallop and no friction rub.   No murmur heard. Pulmonary/Chest: Effort normal and breath sounds normal. No respiratory distress. She has no decreased breath sounds. She has no wheezes. She has no rhonchi. She has no rales.  Abdominal: Soft. Normal appearance and bowel sounds are normal. She exhibits no distension. There is no tenderness. There is CVA tenderness. There is no rigidity, no rebound, no guarding, no  tenderness at McBurney's point and negative Murphy's sign.  Soft, NTND, +BS throughout, no r/g/r, neg murphy's, neg mcburney's, with mild right sided CVA TTP   Musculoskeletal: Normal range of motion.  MAE x4 Strength and sensation grossly intact Distal pulses intact Gait steady  Neurological: She is alert and oriented to person, place, and time. She has normal strength. No sensory deficit.  Skin: Skin is warm, dry and intact. No rash noted.  Psychiatric: She has a normal mood and affect.  Nursing note and vitals reviewed.   ED Course  Procedures  DIAGNOSTIC STUDIES: Oxygen Saturation is 98% on RA, normal by my interpretation.  COORDINATION OF CARE:  10:15 PM Discussed treatment plan which includes rocephin with pt at bedside and pt agreed to plan.  Labs Review Labs Reviewed  URINALYSIS, ROUTINE W REFLEX MICROSCOPIC (NOT AT [facility]) - Abnormal; Notable for the following:    APPearance CLOUDY (*)    Hgb urine dipstick MODERATE (*)    Leukocytes, UA LARGE (*)    All other components within normal limits  URINE MICROSCOPIC-ADD ON - Abnormal; Notable for the following:    Bacteria, UA MANY (*)    All other components within normal limits  URINE CULTURE    Imaging Review No results found. I have personally reviewed and evaluated these images and lab results as part of my medical decision-making.   EKG Interpretation None      MDM   Final diagnoses:  Pyelonephritis  UTI (lower urinary tract infection)  Right flank pain    50 y.o. female here with R flank pain x1 day, dysuria, malodorous cloudy urine, and fevers/chills today. Tmax 100.5 at home, afebrile here but she was drinking cold water prior to arrival. +R CVA tenderness. No abdominal tenderness, no n/v. No hematuria and no radiation of symptoms, doubt kidney stones. U/A with moderate hgb and 0-5 RBCs, large leuks, neg nitrite, no squamous, and TNTC WBC with many bacteria consistent with UTI/pyelonephritis. Slight  headache today but no focal neuro deficits, no meningeal signs, and not the worst headache of her life-- this is likely from the fever/not feeling well. Doubt need for further imaging/labs. Pt otherwise healthy, tolerating PO well, can be managed outpatient. Will give IM rocephin then d/c home with keflex for her pyelonephritis. Tylenol/motrin for pain/fever, and stay hydrated. F/up with PCP in 5-7 days for recheck. I explained the diagnosis and have given explicit precautions to return to the ER including for any other new or worsening symptoms. The patient understands and accepts the medical plan as it's been dictated and I have answered their questions. Discharge instructions concerning home care and prescriptions have been given. The patient is STABLE and is discharged to home in good condition.   I personally performed the services described in this documentation, which was scribed in my presence. The recorded information has been reviewed  and is accurate.  BP 129/77 mmHg  Pulse 78  Temp(Src) 99.3 F (37.4 C) (Oral)  Resp 18  SpO2 98%  Meds ordered this encounter  Medications  . cefTRIAXone (ROCEPHIN) injection 1 g    Sig:     Order Specific Question:  Antibiotic Indication:    Answer:  UTI  . cephALEXin (KEFLEX) 500 MG capsule    Sig: Take 1 capsule (500 mg total) by mouth 3 (three) times daily. x10 days    Dispense:  30 capsule    Refill:  0    Order Specific Question:  Supervising Provider    Answer:  Eber Hong [[id number]]     Gianpaolo Mindel Camprubi-Soms, PA-C 11/13/15 2231  Azalia Bilis, MD 11/14/15 [phone]

## 2015-11-13 NOTE — Discharge Instructions (Signed)
Stay very well hydrated with plenty of water throughout the day. Take antibiotic until completed. Use tylenol or motrin as needed for pain or fever. Follow up with your primary care physician in 5-7 days for recheck of ongoing symptoms but return to ER for emergent changing or worsening of symptoms. Please seek immediate care if you develop the following: You develop back pain.  Your symptoms are no better, or worse in 3 days. There is severe back pain or lower abdominal pain.  You develop chills.  You have a fever.  There is nausea or vomiting.  There is continued burning or discomfort with urination.    Urinary Tract Infection Urinary tract infections (UTIs) can develop anywhere along your urinary tract. Your urinary tract is your body's drainage system for removing wastes and extra water. Your urinary tract includes two kidneys, two ureters, a bladder, and a urethra. Your kidneys are a pair of bean-shaped organs. Each kidney is about the size of your fist. They are located below your ribs, one on each side of your spine. CAUSES Infections are caused by microbes, which are microscopic organisms, including fungi, viruses, and bacteria. These organisms are so small that they can only be seen through a microscope. Bacteria are the microbes that most commonly cause UTIs. SYMPTOMS  Symptoms of UTIs may vary by age and gender of the patient and by the location of the infection. Symptoms in young women typically include a frequent and intense urge to urinate and a painful, burning feeling in the bladder or urethra during urination. Older women and men are more likely to be tired, shaky, and weak and have muscle aches and abdominal pain. A fever may mean the infection is in your kidneys. Other symptoms of a kidney infection include pain in your back or sides below the ribs, nausea, and vomiting. DIAGNOSIS To diagnose a UTI, your caregiver will ask you about your symptoms. Your caregiver will also ask you  to provide a urine sample. The urine sample will be tested for bacteria and white blood cells. White blood cells are made by your body to help fight infection. TREATMENT  Typically, UTIs can be treated with medication. Because most UTIs are caused by a bacterial infection, they usually can be treated with the use of antibiotics. The choice of antibiotic and length of treatment depend on your symptoms and the type of bacteria causing your infection. HOME CARE INSTRUCTIONS  If you were prescribed antibiotics, take them exactly as your caregiver instructs you. Finish the medication even if you feel better after you have only taken some of the medication.  Drink enough water and fluids to keep your urine clear or pale yellow.  Avoid caffeine, tea, and carbonated beverages. They tend to irritate your bladder.  Empty your bladder often. Avoid holding urine for long periods of time.  Empty your bladder before and after sexual intercourse.  After a bowel movement, women should cleanse from front to back. Use each tissue only once. SEEK MEDICAL CARE IF:   You have back pain.  You develop a fever.  Your symptoms do not begin to resolve within 3 days. SEEK IMMEDIATE MEDICAL CARE IF:   You have severe back pain or lower abdominal pain.  You develop chills.  You have nausea or vomiting.  You have continued burning or discomfort with urination. MAKE SURE YOU:   Understand these instructions.  Will watch your condition.  Will get help right away if you are not doing well or get  worse.   This information is not intended to replace advice given to you by your health care provider. Make sure you discuss any questions you have with your health care provider.   Document Released: 04/11/2005 Document Revised: 03/23/2015 Document Reviewed: 08/10/2011 Elsevier Interactive Patient Education 2016 Elsevier Inc.  Pyelonephritis, Adult Pyelonephritis is a kidney infection. The kidneys are organs  that help clean your blood by moving waste out of your blood and into your pee (urine). This infection can happen quickly, or it can last for a long time. In most cases, it clears up with treatment and does not cause other problems. HOME CARE Medicines  Take over-the-counter and prescription medicines only as told by your doctor.  Take your antibiotic medicine as told by your doctor. Do not stop taking the medicine even if you start to feel better. General Instructions  Drink enough fluid to keep your pee clear or pale yellow.  Avoid caffeine, tea, and carbonated drinks.  Pee (urinate) often. Avoid holding in pee for long periods of time.  Pee before and after sex.  After pooping (having a bowel movement), women should wipe from front to back. Use each tissue only once.  Keep all follow-up visits as told by your doctor. This is important. GET HELP IF:  You do not feel better after 2 days.  Your symptoms get worse.  You have a fever. GET HELP RIGHT AWAY IF:  You cannot take your medicine or drink fluids as told.  You have chills and shaking.  You throw up (vomit).  You have very bad pain in your side (flank) or back.  You feel very weak or you pass out (faint).   This information is not intended to replace advice given to you by your health care provider. Make sure you discuss any questions you have with your health care provider.   Document Released: 08/09/2004 Document Revised: 03/23/2015 Document Reviewed: 10/25/2014 Elsevier Interactive Patient Education 2016 Elsevier Inc.  Flank Pain Flank pain refers to pain that is located on the side of the body between the upper abdomen and the back. The pain may occur over a short period of time (acute) or may be long-term or reoccurring (chronic). It may be mild or severe. Flank pain can be caused by many things. CAUSES  Some of the more common causes of flank pain include:  Muscle strains.   Muscle spasms.   A  disease of your spine (vertebral disk disease).   A lung infection (pneumonia).   Fluid around your lungs (pulmonary edema).   A kidney infection.   Kidney stones.   A very painful skin rash caused by the chickenpox virus (shingles).   Gallbladder disease.  HOME CARE INSTRUCTIONS  Home care will depend on the cause of your pain. In general,  Rest as directed by your caregiver.  Drink enough fluids to keep your urine clear or pale yellow.  Only take over-the-counter or prescription medicines as directed by your caregiver. Some medicines may help relieve the pain.  Tell your caregiver about any changes in your pain.  Follow up with your caregiver as directed. SEEK IMMEDIATE MEDICAL CARE IF:   Your pain is not controlled with medicine.   You have new or worsening symptoms.  Your pain increases.   You have abdominal pain.   You have shortness of breath.   You have persistent nausea or vomiting.   You have swelling in your abdomen.   You feel faint or pass out.  You have blood in your urine.  You have a fever or persistent symptoms for more than 2-3 days.  You have a fever and your symptoms suddenly get worse. MAKE SURE YOU:   Understand these instructions.  Will watch your condition.  Will get help right away if you are not doing well or get worse.   This information is not intended to replace advice given to you by your health care provider. Make sure you discuss any questions you have with your health care provider.   Document Released: 08/23/2005 Document Revised: 03/26/2012 Document Reviewed: 02/14/2012 Elsevier Interactive Patient Education [employer]

## 2015-11-16 LAB — URINE CULTURE: Culture: >=100000 — AB

## 2015-11-17 ENCOUNTER — Telehealth: Payer: Self-pay | Admitting: *Deleted

## 2015-11-17 NOTE — ED Notes (Signed)
Post ED Visit - Positive Culture Follow-up  Culture report reviewed by antimicrobial stewardship pharmacist:  []  Enzo BiNathan Batchelder, Pharm.D. []  Celedonio MiyamotoJeremy Frens, Pharm.D., BCPS []  Garvin FilaMike Maccia, Pharm.D. []  Georgina PillionElizabeth Martin, Pharm.D., BCPS []  DixieMinh Pham, [address]Pharm[address]D., BCPS, AAHIVP []  Estella HuskMichelle Turner, Pharm.D., BCPS, AAHIVP [x]  Tennis Mustassie Stewart, Pharm.D. []  Sherle Poeob Vincent, [address] BoulevardPharm.D.  Positive urine culture Treated with Cephalexin, organism sensitive to the same and no further patient follow-up is required at this time.  Virl AxeRobertson, Dionne Rossa [facility] 11/17/2015, 10:43 AM

## 2017-08-02 ENCOUNTER — Other Ambulatory Visit: Payer: Self-pay | Admitting: Obstetrics and Gynecology

## 2017-08-02 DIAGNOSIS — Z1231 Encounter for screening mammogram for malignant neoplasm of breast: Secondary | ICD-10-CM

## 2017-08-05 ENCOUNTER — Ambulatory Visit
Admission: RE | Admit: 2017-08-05 | Discharge: 2017-08-05 | Disposition: A | Discharge status: Home / Self Care | Payer: Managed Care, Other (non HMO) | Source: Ambulatory Visit | Attending: Obstetrics and Gynecology | Admitting: Obstetrics and Gynecology

## 2017-08-05 DIAGNOSIS — Z1231 Encounter for screening mammogram for malignant neoplasm of breast: Secondary | ICD-10-CM

## 2017-08-05 IMAGING — MG 2D DIGITAL SCREENING BILATERAL MAMMOGRAM WITH 3D TOMO WITH CAD
9 of 12 series · 9 of 28 positions shown · non-contrast
Comparison: Previous exam(s).

CLINICAL DATA: Screening.

EXAM:
2D DIGITAL SCREENING BILATERAL MAMMOGRAM WITH 3D TOMO WITH CAD

[R CC synth-2D]
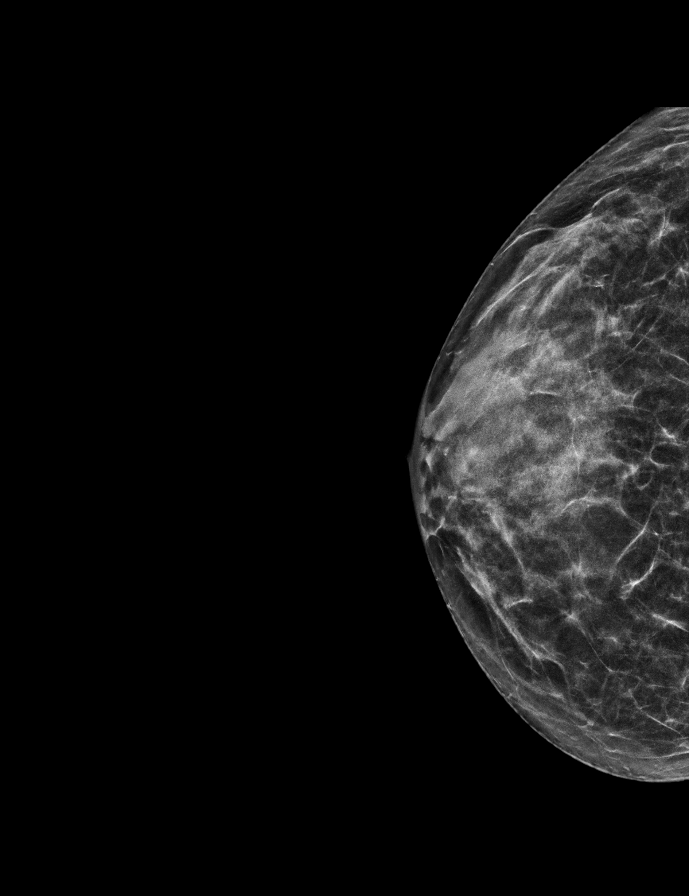

[L MLO synth-2D]
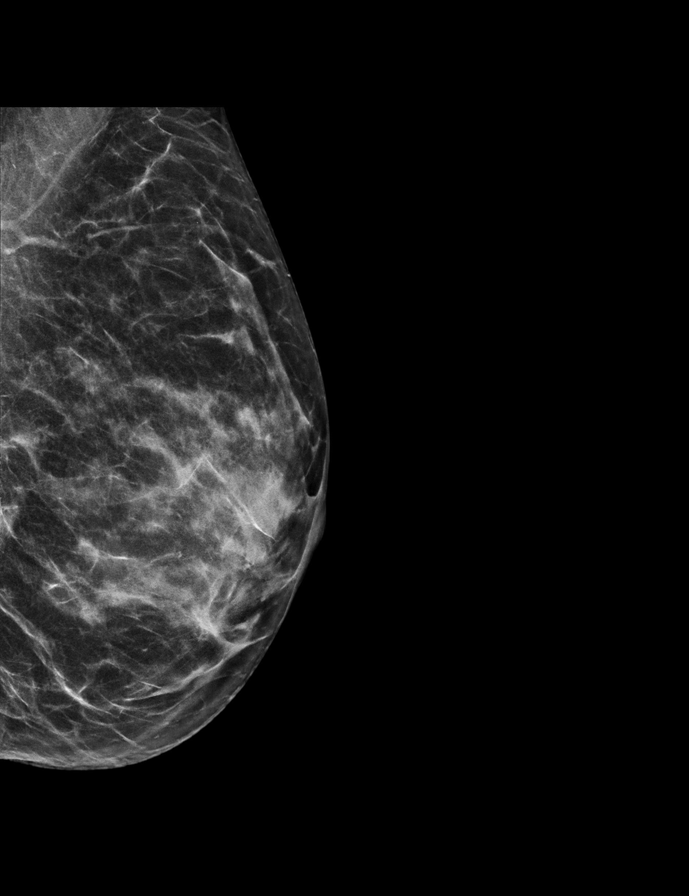

[R MLO synth-2D]
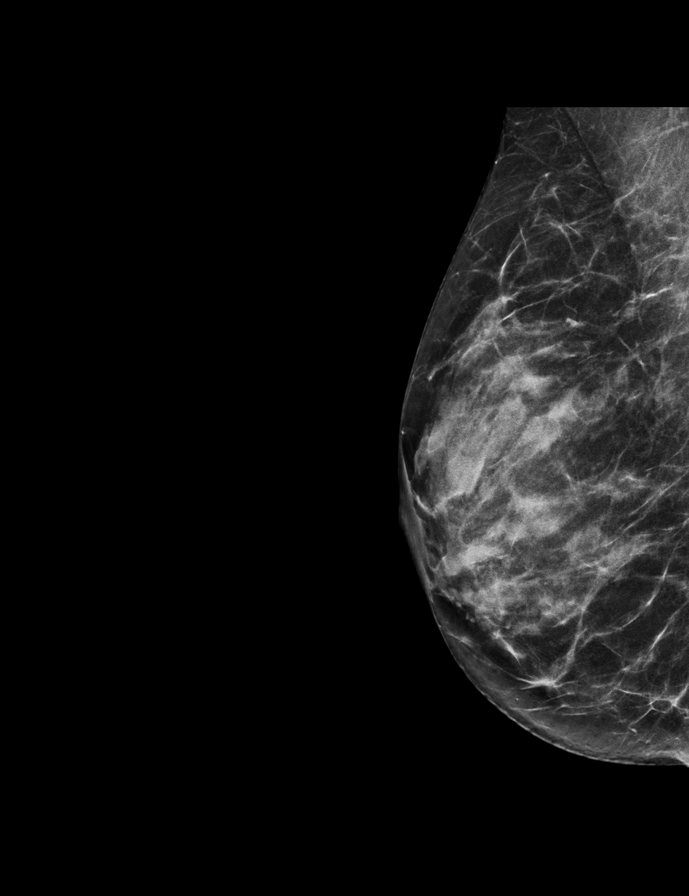

[R MLO]
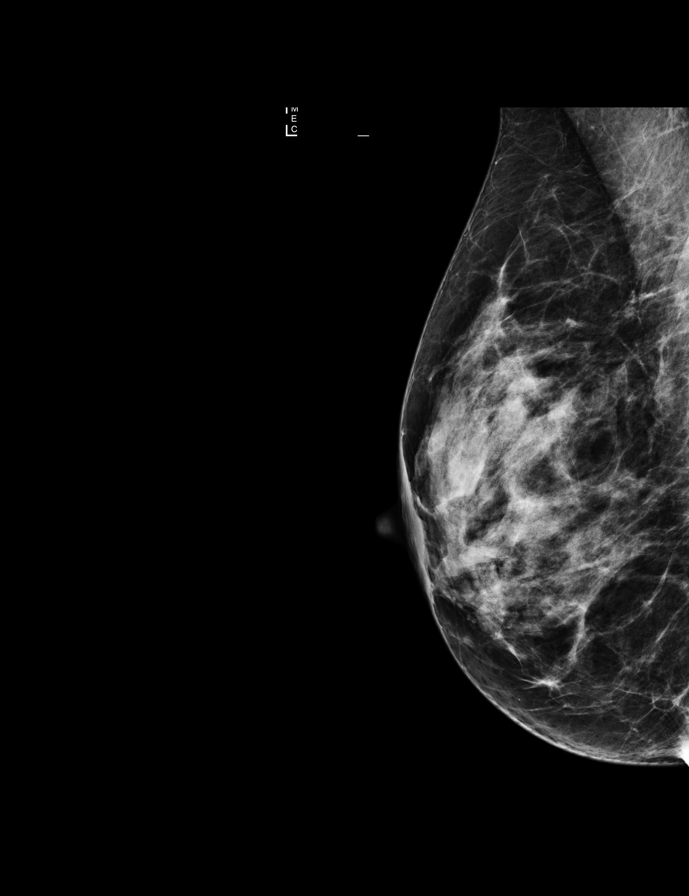

[L CC synth-2D]
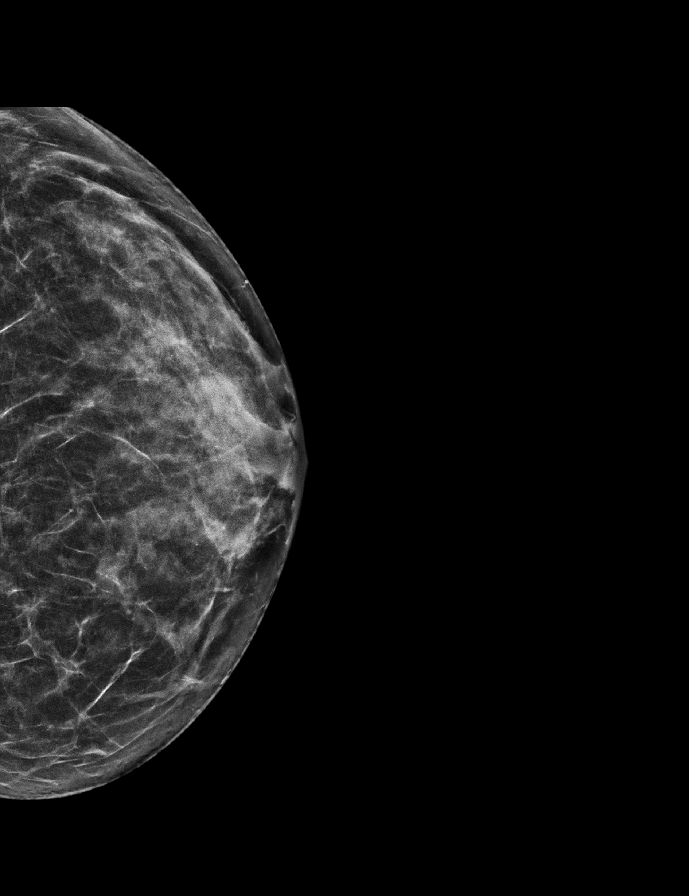

[R CC]
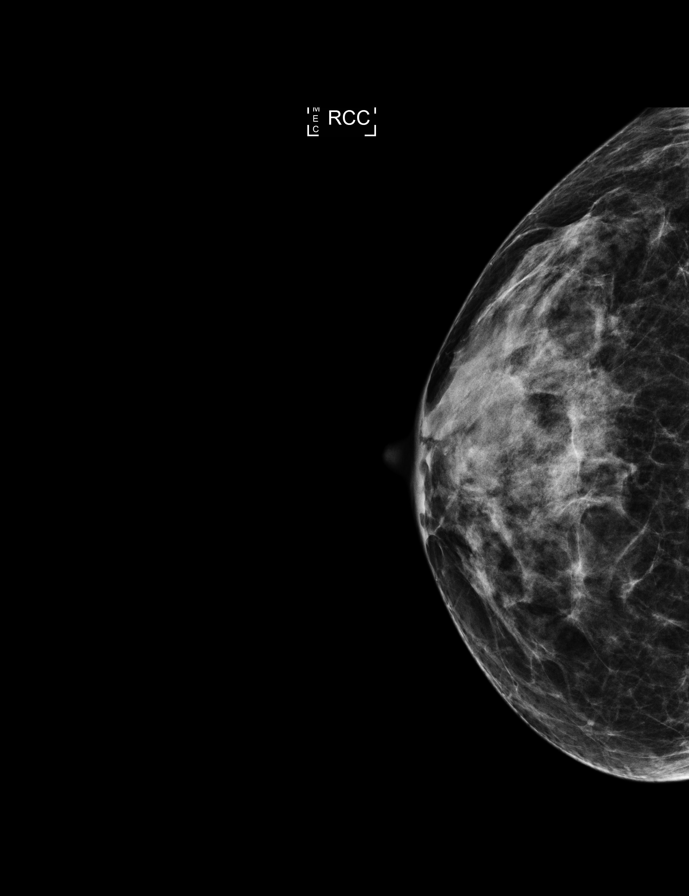

[L CC]
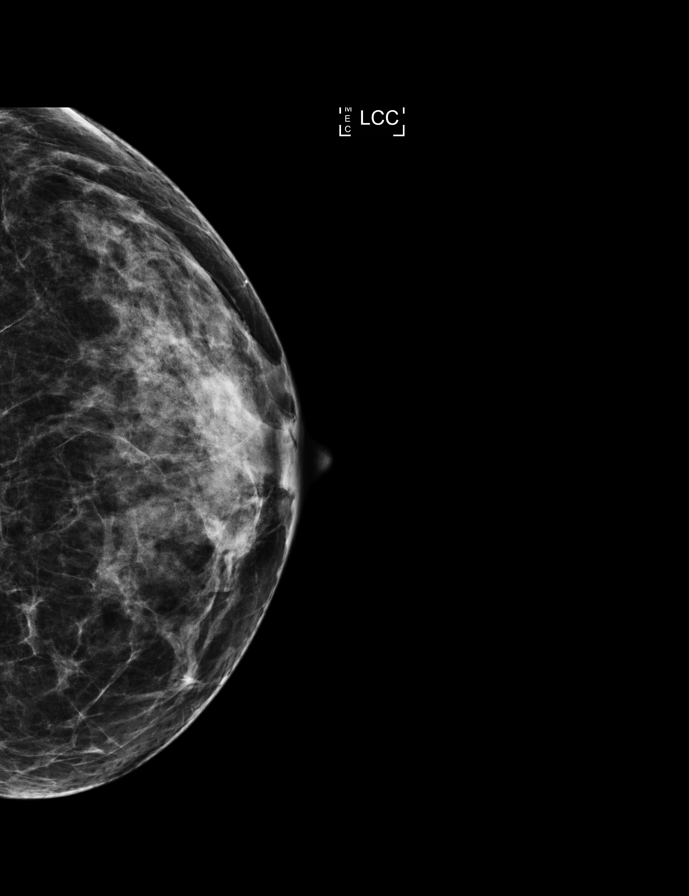

[L MLO]
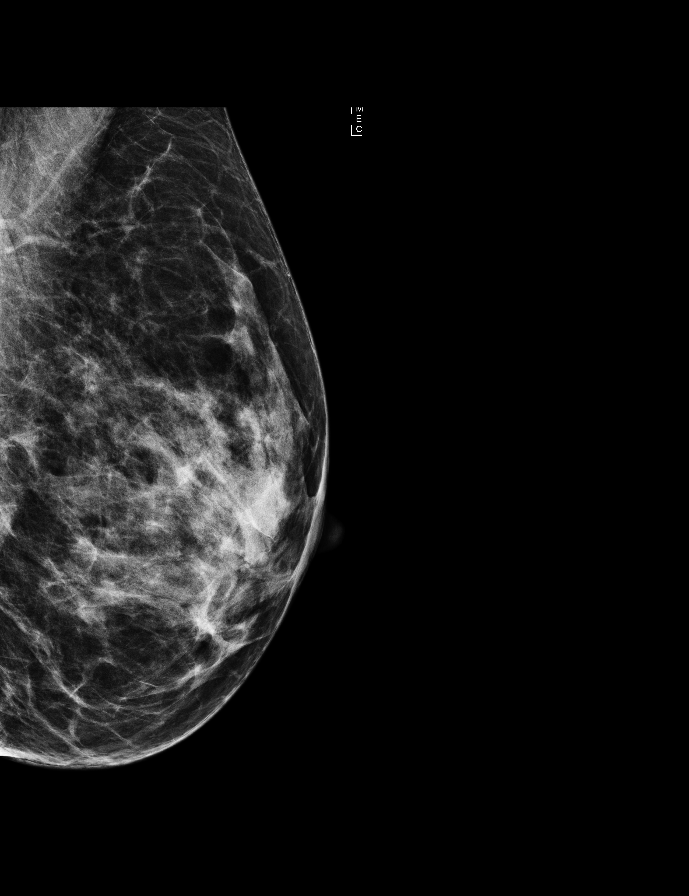

[L MLO tomo · tomo slice 29/57.0]
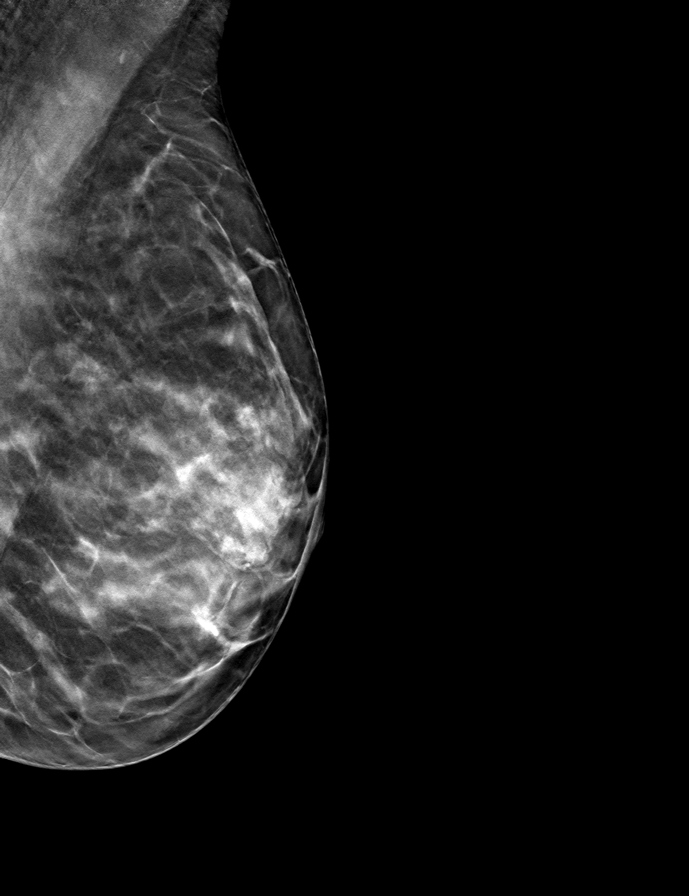

[9 of 28 positions shown; findings below may reference images not displayed]

ACR Breast Density Category c: The breast tissue is heterogeneously
dense, which may obscure small masses.
FINDINGS: There are no findings suspicious for malignancy. Images were
processed with CAD.
IMPRESSION: No mammographic evidence of malignancy. A result letter of this
screening mammogram will be mailed directly to the patient.

RECOMMENDATION:
Screening mammogram in one year. (Code:[4B])

BI-RADS CATEGORY  1: Negative.

## 2018-04-14 ENCOUNTER — Ambulatory Visit: Payer: Managed Care, Other (non HMO) | Attending: *Deleted | Admitting: Physical Therapy

## 2018-04-14 ENCOUNTER — Other Ambulatory Visit: Payer: Self-pay

## 2018-04-14 DIAGNOSIS — R262 Difficulty in walking, not elsewhere classified: Secondary | ICD-10-CM

## 2018-04-14 DIAGNOSIS — M6281 Muscle weakness (generalized): Secondary | ICD-10-CM | POA: Diagnosis not present

## 2018-04-14 NOTE — Patient Instructions (Signed)
Access Code: [email or web address]  URL: [email or web address]  Date: 04/14/2018  Prepared by: Dorie Rank   Exercises  Supine Transversus Abdominis Bracing - Hands on Stomach - 10 reps - 3 sets - 1x daily - 7x weekly

## 2018-04-14 NOTE — Therapy (Signed)
[facility] [address], [address] [place], [place], [address] Phone: [phone]   Fax:  [phone]  Physical Therapy Evaluation  Patient Details  Name: [name] MRN: [id number] Date of Birth: 01/23/1966 Referring Provider (PT): Bernette Redbird   Encounter Date: 04/14/2018  PT End of Session - 04/14/18 1738    Visit Number  1    Date for PT Re-Evaluation  07/07/18    PT Start Time  1533    PT Stop Time  1610    PT Time Calculation (min)  37 min    Activity Tolerance  Patient tolerated treatment well    Behavior During Therapy  [facility] for tasks assessed/performed       Past Medical History:  Diagnosis Date  . Allergy   . Breast mass, right   . Complication of anesthesia    hard to wake up  . GERD (gastroesophageal reflux disease)   . Seizures (HCC)    as teen, no meds since age 3, no seizures    Past Surgical History:  Procedure Laterality Date  . ABDOMINAL HYSTERECTOMY  2006   AP repair also  . BREAST LUMPECTOMY WITH RADIOACTIVE SEED LOCALIZATION Right 09/23/2015   Procedure: BREAST LUMPECTOMY WITH RADIOACTIVE SEED LOCALIZATION;  Surgeon: Chevis Pretty III, MD;  Location: [facility];  Service: General;  Laterality: Right;  . BUNIONECTOMY Bilateral 1986  . EYE SURGERY     lasik  . ROTATOR CUFF REPAIR Right 2012  . URETER SURGERY     as child    There were no vitals filed for this visit.   Subjective Assessment - 04/14/18 1532    Subjective  Burch procedure in 2018.  Daughter 14 years ago had pelvic PT. I have incontinence walking up or down hills.  Mesh and repairs since giving birth.  Sneezing and coughing.      Limitations  Walking    Diagnostic tests  [place] to bladder    Patient Stated Goals  stop having leakage with walking and coughing/sneezing    Currently in Pain?  No/denies         [facility] PT Assessment - 04/14/18 0001      Assessment   Medical Diagnosis  N39.3 (ICD-10-CM) -  Stress incontinence (female) (female)    Referring Provider (PT)  Lula Olszewski, Mathis Dad    Onset Date/Surgical Date  --   October 2018 - mesh placed for prolapse   Prior Therapy  Yes pelvic floor PT 14 years ago      Precautions   Precautions  None      Restrictions   Weight Bearing Restrictions  No      Balance Screen   Has the patient fallen in the past 6 months  Yes    How many times?  1   turned ankle   Has the patient had a decrease in activity level because of a fear of falling?   No    Is the patient reluctant to leave their home because of a fear of falling?   No      Home Environment   Living Environment  Private residence    Living Arrangements  Spouse/significant other;Children      Prior Function   Level of Independence  Independent    Vocation  Full time employment    Vocation Requirements  walking and sitting      Cognition   Overall Cognitive Status  Within Functional Limits for tasks assessed  Posture/Postural Control   Posture/Postural Control  Postural limitations    Postural Limitations  Rounded Shoulders      ROM / Strength   AROM / PROM / Strength  PROM;Strength      PROM   Overall PROM Comments  Lt hip 10% limited      Strength   Overall Strength Comments  Lt hip 4+/5      Flexibility   Soft Tissue Assessment /Muscle Length  yes    Hamstrings  10% restricted bilaterally      Palpation   SI assessment   WNL    Palpation comment  one finger diastasis  from 1" inferior to naval and 1/2" superior      Ambulation/Gait   Gait Pattern  Within Functional Limits                Objective measurements completed on examination: See above findings.    Pelvic Floor Special Questions - 04/14/18 0001    Prior Pelvic/Prostate Exam  Yes    Are you Pregnant or attempting pregnancy?  No    Prior Pregnancies  Yes    Number of Pregnancies  1    Number of Vaginal Deliveries  1    Any difficulty with labor and deliveries  Yes    Episiotomy  Performed  Yes   used vacuum, needed bladder prolapse surgeries   Currently Sexually Active  Yes    Is this Painful  Yes   a little bit tender on the right side   Marinoff Scale  discomfort that does not affect completion    Urinary Leakage  Yes    How often  sneezing/ coughing or certain sit ups at the gym    Pad use  was wearing during 2 weeks, wear going for a walk or to the gym    Urinary urgency  Yes   go through periods of that   Fecal incontinence  No    Fluid intake  water    Caffeine beverages  small amount    Falling out feeling (prolapse)  No   attatched to tail bone   Skin Integrity  Intact    Perineal Body/Introitus   Gaping    Prolapse  Uterine    Pelvic Floor Internal Exam  Pt identity confirmed and informed consent was provided for internal soft tissue assessement    Exam Type  Vaginal    Sensation  normal    Palpation  right posterior scar tissue    Strength  weak squeeze, no lift    Strength # of seconds  5       [facility] Adult PT Treatment/Exercise - 04/14/18 0001      Self-Care   Self-Care  Other Self-Care Comments    Other Self-Care Comments   info on elvie, initial HEP TrA, scar tissue massage             PT Education - 04/14/18 1649    Education Details   Access Code: [email or web address], scar massage and Elvie info    Person(s) Educated  Patient    Methods  Explanation;Demonstration;Verbal cues;Tactile cues;Handout    Comprehension  Verbalized understanding;Returned demonstration       PT Short Term Goals - 04/14/18 1800      PT SHORT TERM GOAL #1   Title  ind with initial HEP    Time  4    Period  Weeks    Status  New    Target Date  05/12/18  PT Long Term Goals - 04/14/18 1801      PT LONG TERM GOAL #1   Title  ind with advanced HEP    Time  12    Period  Weeks    Status  New    Target Date  07/07/18      PT LONG TERM GOAL #2   Title  Pt will report 50% less pain during intercourse    Time  12    Period  Weeks    Status  New     Target Date  07/07/18      PT LONG TERM GOAL #3   Title  pt will demonstrate pelvic floor strength of 3/5 MMT and ability to sustain for at least 15 seconds for endurance when walking    Time  12    Period  Weeks    Status  New    Target Date  07/07/18      PT LONG TERM GOAL #4   Title  pt will be able to peform sit up without sensation of "exploding" from abdomen    Time  12    Period  Weeks    Status  New    Target Date  07/07/18      PT LONG TERM GOAL #5   Title  Pt will report no leakage when walking up or down hills    Time  12    Period  Weeks    Status  New    Target Date  07/07/18             Plan - 04/14/18 1754    Clinical Impression Statement  Pt presents to clinic due to SUI.  She has been experiencing it more when walking or with lunges as well as forceful cough/sneeze.  Pt does other gym exercises like squats without difficulty.  Pt has tightness and weakness in left hip as detailed above.  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  Pt has weakness and decreased endurance of her pelvic floor.  She will benefit from skilled PT to address these concerns and impairments so she can maintain a healthy activity level.    History and Personal Factors relevant to plan of care:  mesh surgery, vaginal delivery at older age    Clinical Presentation  Evolving    Clinical Decision Making  Low    Rehab Potential  Excellent    PT Frequency  2x / week   reducing to 1x after 2-3 weeks or as able   PT Duration  12 weeks    PT Treatment/Interventions  ADLs/Self Care Home Management;Biofeedback;Cryotherapy;Electrical Stimulation;Moist Heat;Therapeutic activities;Therapeutic exercise;Patient/family education;Neuromuscular re-education;Manual techniques;Passive range of motion;Scar mobilization;Dry needling;Taping    PT Next Visit Plan  biofeedback; scar massage, core strengthing     PT Home Exercise Plan  Access Code: [email or web address]    Recommended  Other Services  eval 9/30    Consulted and Agree with Plan of Care  Patient       Patient will benefit from skilled therapeutic intervention in order to improve the following deficits and impairments:  Decreased strength, Increased fascial restricitons, Decreased scar mobility, Difficulty walking  Visit Diagnosis: Muscle weakness (generalized) - Plan: PT plan of care cert/re-cert  Difficulty in walking, not elsewhere classified - Plan: PT plan of care cert/re-cert     Problem List Patient Active Problem List   Diagnosis Date Noted  . METATARSALGIA 07/30/2007    Vincente Poli, PT 04/14/2018, 6:10  PM  [facility] [address] [place], [place], [address] Phone: [phone]   Fax:  [phone]  Name: [name] MRN: [id number] Date of Birth: 1965-08-20

## 2018-05-05 ENCOUNTER — Encounter: Payer: Self-pay | Admitting: Physical Therapy

## 2018-05-05 ENCOUNTER — Ambulatory Visit: Payer: Managed Care, Other (non HMO) | Attending: *Deleted | Admitting: Physical Therapy

## 2018-05-05 DIAGNOSIS — R262 Difficulty in walking, not elsewhere classified: Secondary | ICD-10-CM | POA: Diagnosis present

## 2018-05-05 DIAGNOSIS — M6281 Muscle weakness (generalized): Secondary | ICD-10-CM | POA: Insufficient documentation

## 2018-05-05 NOTE — Patient Instructions (Signed)
Access Code: [email or web address]

## 2018-05-05 NOTE — Therapy (Signed)
[facility] [address], [address] [place], [place], [address] Phone: [phone]   Fax:  [phone]  Physical Therapy Treatment  Patient Details  Name: [name] MRN: [id number] Date of Birth: 23-Feb-1966 Referring Provider (PT): Bernette Redbird   Encounter Date: 05/05/2018  PT End of Session - 05/05/18 0850    Visit Number  2    Date for PT Re-Evaluation  07/07/18    PT Start Time  0849    PT Stop Time  0929    PT Time Calculation (min)  40 min    Activity Tolerance  Patient tolerated treatment well    Behavior During Therapy  [facility] for tasks assessed/performed       Past Medical History:  Diagnosis Date  . Allergy   . Breast mass, right   . Complication of anesthesia    hard to wake up  . GERD (gastroesophageal reflux disease)   . Seizures (HCC)    as teen, no meds since age 64, no seizures    Past Surgical History:  Procedure Laterality Date  . ABDOMINAL HYSTERECTOMY  2006   AP repair also  . BREAST LUMPECTOMY WITH RADIOACTIVE SEED LOCALIZATION Right 09/23/2015   Procedure: BREAST LUMPECTOMY WITH RADIOACTIVE SEED LOCALIZATION;  Surgeon: Chevis Pretty III, MD;  Location: [facility];  Service: General;  Laterality: Right;  . BUNIONECTOMY Bilateral 1986  . EYE SURGERY     lasik  . ROTATOR CUFF REPAIR Right 2012  . URETER SURGERY     as child    There were no vitals filed for this visit.  Subjective Assessment - 05/05/18 0853    Subjective  I bought the Elvie and it seems like it is hard to get my muscles to do what I want them to do.    Patient Stated Goals  stop having leakage with walking and coughing/sneezing    Currently in Pain?  No/denies                       [facility] Adult PT Treatment/Exercise - 05/05/18 0001      Neuro Re-ed    Neuro Re-ed Details   exericses using biofeedback; tactile cues to puborectalis and bulbocavernosis for contract and relax      Exercises   Exercises  Lumbar      Lumbar Exercises: Supine   Bridge  10 reps   with ball squeeze     Lumbar Exercises: Quadruped   Opposite Arm/Leg Raise  Right arm/Left leg;Left arm/Right leg;10 reps      Manual Therapy   Manual Therapy  Internal Pelvic Floor    Manual therapy comments  identity confirmed and pt informed and consent given to perform internal STM    Internal Pelvic Floor  scar realease to scar on Rt; fascial release around bladder that is shifted to left             PT Education - 05/05/18 1501    Education Details  Access Code: [email or web address]    Person(s) Educated  Patient    Methods  Explanation;Demonstration;Handout;Verbal cues;Tactile cues    Comprehension  Verbalized understanding;Returned demonstration       PT Short Term Goals - 05/05/18 1504      PT SHORT TERM GOAL #1   Title  ind with initial HEP    Time  4    Period  Weeks    Status  Achieved  PT Long Term Goals - 04/14/18 1801      PT LONG TERM GOAL #1   Title  ind with advanced HEP    Time  12    Period  Weeks    Status  New    Target Date  07/07/18      PT LONG TERM GOAL #2   Title  Pt will report 50% less pain during intercourse    Time  12    Period  Weeks    Status  New    Target Date  07/07/18      PT LONG TERM GOAL #3   Title  pt will demonstrate pelvic floor strength of 3/5 MMT and ability to sustain for at least 15 seconds for endurance when walking    Time  12    Period  Weeks    Status  New    Target Date  07/07/18      PT LONG TERM GOAL #4   Title  pt will be able to peform sit up without sensation of "exploding" from abdomen    Time  12    Period  Weeks    Status  New    Target Date  07/07/18      PT LONG TERM GOAL #5   Title  Pt will report no leakage when walking up or down hills    Time  12    Period  Weeks    Status  New    Target Date  07/07/18            Plan - 05/05/18 0930    Clinical Impression Statement  Pt demonstrates improved  strength overall.  She has good squeeze and definite lift.  Pt did well with exercises and able to use Elvie for visual and tactile feedback.  Pt was able to progress HEP today and will benefit from skilled PT to continue working on strength and endurance.    PT Treatment/Interventions  ADLs/Self Care Home Management;Biofeedback;Cryotherapy;Electrical Stimulation;Moist Heat;Therapeutic activities;Therapeutic exercise;Patient/family education;Neuromuscular re-education;Manual techniques;Passive range of motion;Scar mobilization;Dry needling;Taping    PT Next Visit Plan  endurance, start with eliptical, progress core and TrA strength    PT Home Exercise Plan  Access Code: [email or web address]    Consulted and Agree with Plan of Care  Patient       Patient will benefit from skilled therapeutic intervention in order to improve the following deficits and impairments:  Decreased strength, Increased fascial restricitons, Decreased scar mobility, Difficulty walking  Visit Diagnosis: Muscle weakness (generalized)  Difficulty in walking, not elsewhere classified     Problem List Patient Active Problem List   Diagnosis Date Noted  . METATARSALGIA 07/30/2007    Vincente Poli, PT 05/05/2018, 3:05 PM  [facility] [address], [address] [place], [place], [address] Phone: [phone]   Fax:  [phone]  Name: [name] MRN: [id number] Date of Birth: [birth year]

## 2018-05-07 ENCOUNTER — Encounter: Payer: Managed Care, Other (non HMO) | Admitting: Physical Therapy

## 2018-05-12 ENCOUNTER — Ambulatory Visit: Payer: Managed Care, Other (non HMO) | Admitting: Physical Therapy

## 2018-05-14 ENCOUNTER — Ambulatory Visit: Payer: Managed Care, Other (non HMO) | Admitting: Physical Therapy

## 2018-05-19 ENCOUNTER — Ambulatory Visit: Payer: Managed Care, Other (non HMO) | Attending: *Deleted | Admitting: Physical Therapy

## 2018-05-19 DIAGNOSIS — R262 Difficulty in walking, not elsewhere classified: Secondary | ICD-10-CM | POA: Diagnosis present

## 2018-05-19 DIAGNOSIS — M6281 Muscle weakness (generalized): Secondary | ICD-10-CM | POA: Insufficient documentation

## 2018-05-19 NOTE — Therapy (Signed)
[facility] [address], [address] [place], [place], [address] Phone: [phone]   Fax:  [phone]  Physical Therapy Treatment  Patient Details  Name: [name] MRN: [id number] Date of Birth: [birth year] Referring Provider (PT): Bernette Redbird   Encounter Date: 05/19/2018  PT End of Session - 05/19/18 0933    Visit Number  3    Date for PT Re-Evaluation  07/07/18    PT Start Time  0932    PT Stop Time  1011    PT Time Calculation (min)  39 min    Activity Tolerance  Patient tolerated treatment well    Behavior During Therapy  [facility] for tasks assessed/performed       Past Medical History:  Diagnosis Date  . Allergy   . Breast mass, right   . Complication of anesthesia    hard to wake up  . GERD (gastroesophageal reflux disease)   . Seizures (HCC)    as teen, no meds since age 58, no seizures    Past Surgical History:  Procedure Laterality Date  . ABDOMINAL HYSTERECTOMY  2006   AP repair also  . BREAST LUMPECTOMY WITH RADIOACTIVE SEED LOCALIZATION Right 09/23/2015   Procedure: BREAST LUMPECTOMY WITH RADIOACTIVE SEED LOCALIZATION;  Surgeon: Chevis Pretty III, MD;  Location: [facility];  Service: General;  Laterality: Right;  . BUNIONECTOMY Bilateral 1986  . EYE SURGERY     lasik  . ROTATOR CUFF REPAIR Right 2012  . URETER SURGERY     as child    There were no vitals filed for this visit.  Subjective Assessment - 05/19/18 1443    Subjective  I started having the leakage again which I hadn't had since just before coming here.  I am not sure if it was jumping or taking Advil or something else.    Patient Stated Goals  stop having leakage with walking and coughing/sneezing    Currently in Pain?  No/denies                       [facility] Adult PT Treatment/Exercise - 05/19/18 0001      Exercises   Exercises  Lumbar      Lumbar Exercises: Supine   Bridge  10 reps;5 seconds    with pelvic floor contraction   Other Supine Lumbar Exercises  fascial release on soft foam roll, side to side and single leg hugs, educated on elevating hips at night      Manual Therapy   Manual Therapy  Internal Pelvic Floor;Myofascial release    Manual therapy comments  identity confirmed and pt informed and consent given to perform internal STM    Myofascial Release  lower abdomen, used cup across scar tissue    Internal Pelvic Floor  fascial release bilateral with more tension Lt>Rt               PT Short Term Goals - 05/05/18 1504      PT SHORT TERM GOAL #1   Title  ind with initial HEP    Time  4    Period  Weeks    Status  Achieved        PT Long Term Goals - 05/19/18 1437      PT LONG TERM GOAL #1   Title  ind with advanced HEP    Status  On-going      PT LONG TERM GOAL #2   Title  Pt will report 50% less pain during intercourse    Status  On-going      PT LONG TERM GOAL #3   Title  pt will demonstrate pelvic floor strength of 3/5 MMT and ability to sustain for at least 15 seconds for endurance when walking    Status  On-going      PT LONG TERM GOAL #4   Title  pt will be able to peform sit up without sensation of "exploding" from abdomen    Status  On-going      PT LONG TERM GOAL #5   Title  Pt will report no leakage when walking up or down hills    Status  On-going            Plan - 05/19/18 1425    Clinical Impression Statement  Pt had more muscle spasms and fascial restrections palpated today.  She reports that she has had more urinary leakage like she did prior to coming to PT.  Pt was educated in self massage and added hip internal rotation stretch to HEP.  Strength feels improved since starting PT and is 2/5MMT today.  Pt will benefit from skilled PT to progress strength and working on improved soft tissue length for maximum functional use of muscles.      PT Treatment/Interventions  ADLs/Self Care Home  Management;Biofeedback;Cryotherapy;Electrical Stimulation;Moist Heat;Therapeutic activities;Therapeutic exercise;Patient/family education;Neuromuscular re-education;Manual techniques;Passive range of motion;Scar mobilization;Dry needling;Taping    PT Next Visit Plan  f/u on self massage and hip stretches, endurance, start with eliptical, progress core and TrA strength    PT Home Exercise Plan  Access Code: [email or web address]    Consulted and Agree with Plan of Care  Patient       Patient will benefit from skilled therapeutic intervention in order to improve the following deficits and impairments:  Decreased strength, Increased fascial restricitons, Decreased scar mobility, Difficulty walking  Visit Diagnosis: Muscle weakness (generalized)  Difficulty in walking, not elsewhere classified     Problem List Patient Active Problem List   Diagnosis Date Noted  . METATARSALGIA 07/30/2007    Vincente Poli, PT 05/19/2018, 2:44 PM  [facility] [address], [address] [place], [place], [address] Phone: [phone]   Fax:  [phone]  Name: [name] MRN: [id number] Date of Birth: 11/05/1965

## 2018-05-21 ENCOUNTER — Ambulatory Visit: Payer: Managed Care, Other (non HMO) | Admitting: Physical Therapy

## 2018-06-02 ENCOUNTER — Encounter: Payer: Managed Care, Other (non HMO) | Admitting: Physical Therapy

## 2018-06-05 ENCOUNTER — Ambulatory Visit: Payer: Managed Care, Other (non HMO) | Admitting: Physical Therapy

## 2018-06-05 ENCOUNTER — Encounter: Payer: Self-pay | Admitting: Physical Therapy

## 2018-06-05 DIAGNOSIS — M6281 Muscle weakness (generalized): Secondary | ICD-10-CM

## 2018-06-05 DIAGNOSIS — R262 Difficulty in walking, not elsewhere classified: Secondary | ICD-10-CM

## 2018-06-05 NOTE — Patient Instructions (Signed)
Access Code: [email or web address]  URL: [email or web address]  Date: 06/05/2018  Prepared by: Dorie RankJacqueline Crosser   Exercises  Supine Hip Internal and External Rotation - 10 reps - 3 sets - 1x daily - 7x weekly  Supine Bridge - 10 reps - 3 sets - 1x daily - 7x weekly  Bird Dog - 10 reps - 3 sets - 1x daily - 7x weekly  Warrior I - 3 reps - 1 sets - 5-10 sec hold - 1x daily - 7x weekly  Warrior II - 3 reps - 1 sets - 5-10 sec hold - 1x daily - 7x weekly  Supine Hamstring Stretch with Strap - 3 reps - 1 sets - 30 sec hold - 1x daily - 7x weekly  Sidelying Reverse Clamshell - 10 reps - 2 sets - 1x daily - 7x weekly  Beginner Clam - 10 reps - 2 sets - 1x daily - 7x weekly  Shoulder extension with resistance - Neutral - 10 reps - 2 sets - 1x daily - 7x weekly

## 2018-06-05 NOTE — Therapy (Signed)
[facility] [address], [address] [place], [place], [address] Phone: [phone]   Fax:  [phone]  Physical Therapy Treatment  Patient Details  Name: Alcario DroughtGillian S Schuermann MRN: [id number] Date of Birth: 23-Jan-1966 Referring Provider (PT): Bernette RedbirdONNOLLY, ANNAMARIE   Encounter Date: 06/05/2018  PT End of Session - 06/05/18 1357    Visit Number  4    Date for PT Re-Evaluation  07/07/18    PT Start Time  1358    PT Stop Time  1438    PT Time Calculation (min)  40 min    Activity Tolerance  Patient tolerated treatment well    Behavior During Therapy  [facility] for tasks assessed/performed       Past Medical History:  Diagnosis Date  . Allergy   . Breast mass, right   . Complication of anesthesia    hard to wake up  . GERD (gastroesophageal reflux disease)   . Seizures (HCC)    as teen, no meds since age 52, no seizures    Past Surgical History:  Procedure Laterality Date  . ABDOMINAL HYSTERECTOMY  2006   AP repair also  . BREAST LUMPECTOMY WITH RADIOACTIVE SEED LOCALIZATION Right 09/23/2015   Procedure: BREAST LUMPECTOMY WITH RADIOACTIVE SEED LOCALIZATION;  Surgeon: Chevis PrettyPaul Toth III, MD;  [facility];  Service: General;  Laterality: Right;  . BUNIONECTOMY Bilateral 1986  . EYE SURGERY     lasik  . ROTATOR CUFF REPAIR Right 2012  . URETER SURGERY     as child    There were no vitals filed for this visit.  Subjective Assessment - 06/05/18 1400    Subjective  It has been very unpredictible because I am still having leakage but not always during the same activity.  The elvie stopped connecting so I am trying to get a new one sent.    Patient Stated Goals  stop having leakage with walking and coughing/sneezing    Currently in Pain?  No/denies                       [facility]/Exercise - 06/05/18 0001      Balance Poses: Yoga   Warrior I  1 rep;15 seconds    Warrior II  1  rep;15 seconds      Self-Care   Other Self-Care Comments   reviewed previous HEP verbally and made edits as shown in chart      Neuro Re-ed    Neuro Re-ed Details   tactile cues for pelvic floor and TrA contraction      Exercises   Exercises  Lumbar      Lumbar Exercises: Stretches   Other Lumbar Stretch Exercise  internal and external rotation of the hips bilaterally      Lumbar Exercises: Standing   Shoulder Extension  Strengthening;Both;20 reps;Theraband   TrA and pelvic floor bracing   Theraband Level (Shoulder Extension)  Level 1 (Yellow);Level 2 (Red)   started red but had difficulty maintaining core support     Manual Therapy   Manual Therapy  Internal Pelvic Floor    Manual therapy comments  identity confirmed and pt informed and consent given to perform internal STM    Internal Pelvic Floor  see neuro re-ed - tactile cues used to activte contraction of pubococcygus and for circular contraction with simultaneus TrA             PT Education - 06/05/18 1450  Education Details   Access Code: [email or web address]     Person(s) Educated  Patient    Methods  Explanation;Demonstration;Handout;Verbal cues    Comprehension  Verbalized understanding;Returned demonstration       PT Short Term Goals - 05/05/18 1504      PT SHORT TERM GOAL #1   Title  ind with initial HEP    Time  4    Period  Weeks    Status  Achieved        PT Long Term Goals - 06/05/18 1400      PT LONG TERM GOAL #1   Title  ind with advanced HEP    Status  On-going      PT LONG TERM GOAL #2   Title  Pt will report 50% less pain during intercourse    Baseline  not painful just small place where the scar tissue is    Status  Achieved      PT LONG TERM GOAL #3   Title  pt will demonstrate pelvic floor strength of 3/5 MMT and ability to sustain for at least 15 seconds for endurance when walking    Baseline  3/5 for 5 seconds    Status  On-going      PT LONG TERM GOAL #4   Title  pt will be able  to peform sit up without sensation of "exploding" from abdomen    Baseline  feels puffy and bloated if I do too many planks      PT LONG TERM GOAL #5   Title  Pt will report no leakage when walking up or down hills    Baseline  unpredictible of when the leakage will happen, it was good for about a week and then came back    Status  On-going            Plan - 06/05/18 1442    Clinical Impression Statement  Pt was able to coordinate TrA and pelvic floor contraction with cueing initially and then remained consistent.  Pt felt some pressure down into abdomen with shoulder extension when using red band but was able to perform correctly with yellow band.  Pt has tight glutes, piriformis and obdurator internus on Lt side.  Muscle release occurred with STM treatment.  She will benefit from skilled PT to re-assess and progress strength and endurance to ensure she is doing exericses corectly for safe return to functional walking activities.    PT Treatment/Interventions  ADLs/Self Care Home Management;Biofeedback;Cryotherapy;Electrical Stimulation;Moist Heat;Therapeutic activities;Therapeutic exercise;Patient/family education;Neuromuscular re-education;Manual techniques;Passive range of motion;Scar mobilization;Dry needling;Taping    PT Next Visit Plan  f/u new exercises added, endurance, start with eliptical, progress core and TrA strength    PT Home Exercise Plan  Access Code: [email or web address]    Consulted and Agree with Plan of Care  Patient       Patient will benefit from skilled therapeutic intervention in order to improve the following deficits and impairments:  Decreased strength, Increased fascial restricitons, Decreased scar mobility, Difficulty walking  Visit Diagnosis: Muscle weakness (generalized)  Difficulty in walking, not elsewhere classified     Problem List Patient Active Problem List   Diagnosis Date Noted  . METATARSALGIA 07/30/2007    Vincente Poli, PT 06/05/2018, 5:21  PM  [facility] [address] [place], [place], [address] Phone: [phone]   Fax:  [phone]  Name: HALIEGH KHURANA MRN: [id number] Date of Birth: Dec 17, 1965

## 2018-06-19 ENCOUNTER — Ambulatory Visit: Payer: Managed Care, Other (non HMO) | Attending: *Deleted | Admitting: Physical Therapy

## 2018-06-19 DIAGNOSIS — R262 Difficulty in walking, not elsewhere classified: Secondary | ICD-10-CM | POA: Diagnosis present

## 2018-06-19 DIAGNOSIS — M6281 Muscle weakness (generalized): Secondary | ICD-10-CM | POA: Diagnosis present

## 2018-06-19 NOTE — Therapy (Addendum)
[facility] [address] [place], [place], [address] Phone: [phone]   Fax:  [phone]  Physical Therapy Treatment  Patient Details  Name: [name] MRN: [id number] Date of Birth: 01-Jun-1966 Referring Provider (PT): Carol Ada   Encounter Date: 06/19/2018  PT End of Session - 06/19/18 1300    Visit Number  5    Date for PT Re-Evaluation  07/07/18    PT Start Time  9798    PT Stop Time  1310    PT Time Calculation (min)  39 min       Past Medical History:  Diagnosis Date  . Allergy   . Breast mass, right   . Complication of anesthesia    hard to wake up  . GERD (gastroesophageal reflux disease)   . Seizures ([place])    as teen, no meds since age 20, no seizures    Past Surgical History:  Procedure Laterality Date  . ABDOMINAL HYSTERECTOMY  2006   AP repair also  . BREAST LUMPECTOMY WITH RADIOACTIVE SEED LOCALIZATION Right 09/23/2015   Procedure: BREAST LUMPECTOMY WITH RADIOACTIVE SEED LOCALIZATION;  Surgeon: Autumn Messing III, MD;  Location: [facility];  Service: General;  Laterality: Right;  . BUNIONECTOMY Bilateral 1986  . EYE SURGERY     lasik  . ROTATOR CUFF REPAIR Right 2012  . URETER SURGERY     as child    There were no vitals filed for this visit.  Subjective Assessment - 06/19/18 1259    Subjective  Everything has been good and no problems but I am still not sure why.  I still feel pulling at lower abdominal and wonder what I can do for that.    Patient Stated Goals  stop having leakage with walking and coughing/sneezing    Currently in Pain?  No/denies                       [facility] Adult PT Treatment/Exercise - 06/19/18 0001      Self-Care   Other Self-Care Comments   demonstrated and performed scar massage with cup      Exercises   Exercises  Lumbar      Lumbar Exercises: Standing   Other Standing Lumbar Exercises  shoulder flexion - 3lb  with TrA activation - 10 reps    Other Standing Lumbar Exercises  push up on elevated mat - 10x with TrA      Lumbar Exercises: Supine   Dead Bug  10 reps   variety of LE positions to demonstrate progression; 3lb UE     Manual Therapy   Myofascial Release  abdominal fascial release               PT Short Term Goals - 05/05/18 1504      PT SHORT TERM GOAL #1   Title  ind with initial HEP    Time  4    Period  Weeks    Status  Achieved        PT Long Term Goals - 06/05/18 1400      PT LONG TERM GOAL #1   Title  ind with advanced HEP    Status  On-going      PT LONG TERM GOAL #2   Title  Pt will report 50% less pain during intercourse    Baseline  not painful just small place where the scar tissue is    Status  Achieved      PT LONG TERM GOAL #3   Title  pt will demonstrate pelvic floor strength of 3/5 MMT and ability to sustain for at least 15 seconds for endurance when walking    Baseline  3/5 for 5 seconds    Status  On-going      PT LONG TERM GOAL #4   Title  pt will be able to peform sit up without sensation of "exploding" from abdomen    Baseline  feels puffy and bloated if I do too many planks      PT LONG TERM GOAL #5   Title  Pt will report no leakage when walking up or down hills    Baseline  unpredictible of when the leakage will happen, it was good for about a week and then came back    Status  On-going            Plan - 06/19/18 1331    Clinical Impression Statement  Pt is doing well with HEP overall.  She is currently noticing that she has some abdominal bulging and tight feeling in lower abdomen at the end of the day and with certain exercises.  She was educated in scar massage with plastic cupping tool and a progression for lower level abdominal strengthening exercises were added to her HEP.  Pt will benefit from 1-2 more visits in order to ensure successful transition to HEP.    PT Treatment/Interventions  ADLs/Self Care Home  Management;Biofeedback;Cryotherapy;Electrical Stimulation;Moist Heat;Therapeutic activities;Therapeutic exercise;Patient/family education;Neuromuscular re-education;Manual techniques;Passive range of motion;Scar mobilization;Dry needling;Taping    PT Next Visit Plan  review goals, f/u new exercises added, endurance, start with eliptical, progress core and TrA strength    PT Home Exercise Plan  Access Code: [email or web address]    Recommended Other Services  cert signed    Consulted and Agree with Plan of Care  Patient       Patient will benefit from skilled therapeutic intervention in order to improve the following deficits and impairments:  Decreased strength, Increased fascial restricitons, Decreased scar mobility, Difficulty walking  Visit Diagnosis: No diagnosis found.     Problem List Patient Active Problem List   Diagnosis Date Noted  . METATARSALGIA 07/30/2007    Zannie Cove, PT 06/19/2018, 1:40 PM  [facility] [address], [place], [place], [address] Phone: [phone]   Fax:  [phone]  Name: [name] MRN: [id number] Date of Birth: [birth year]  PHYSICAL THERAPY DISCHARGE SUMMARY  Visits from Start of Care: 5  Current functional level related to goals / functional outcomes: See above remaining   Remaining deficits: See above   Education / Equipment: HEP  Plan: Patient agrees to discharge.  Patient goals were partially met. Patient is being discharged due to not returning since the last visit.  ?????    [employer], PT 09/01/18 11:48 AM

## 2018-07-01 ENCOUNTER — Encounter: Payer: Managed Care, Other (non HMO) | Admitting: Physical Therapy

## 2018-07-03 ENCOUNTER — Other Ambulatory Visit: Payer: Self-pay | Admitting: Obstetrics and Gynecology

## 2018-07-03 DIAGNOSIS — Z1231 Encounter for screening mammogram for malignant neoplasm of breast: Secondary | ICD-10-CM

## 2018-07-14 ENCOUNTER — Ambulatory Visit: Payer: Managed Care, Other (non HMO) | Admitting: Physical Therapy

## 2018-08-08 ENCOUNTER — Ambulatory Visit
Admission: RE | Admit: 2018-08-08 | Discharge: 2018-08-08 | Disposition: A | Discharge status: Home / Self Care | Payer: Managed Care, Other (non HMO) | Source: Ambulatory Visit | Attending: Obstetrics and Gynecology | Admitting: Obstetrics and Gynecology

## 2018-08-08 DIAGNOSIS — Z1231 Encounter for screening mammogram for malignant neoplasm of breast: Secondary | ICD-10-CM

## 2018-08-08 IMAGING — MG DIGITAL SCREENING BILATERAL MAMMOGRAM WITH TOMO AND CAD
6 of 10 series · 6 of 30 positions shown · non-contrast
Comparison: Previous exam(s).

CLINICAL DATA: Screening.

EXAM:
DIGITAL SCREENING BILATERAL MAMMOGRAM WITH TOMO AND CAD

[L MLO synth-2D]
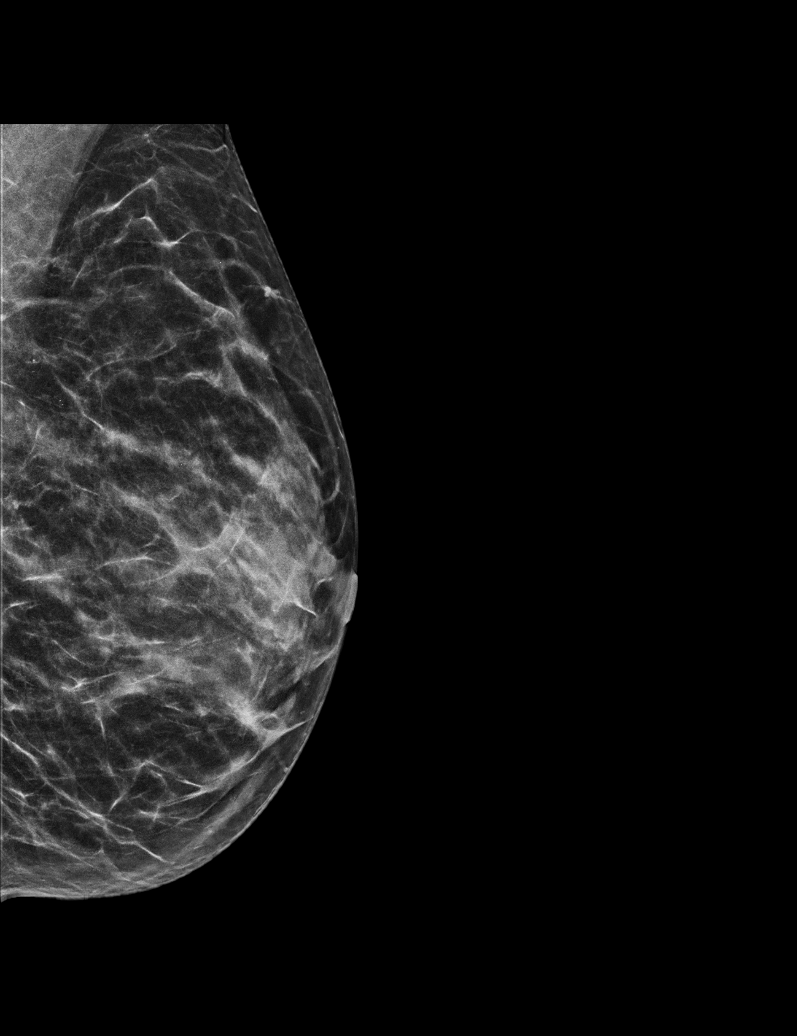

[R MLO synth-2D (1 of 2)]
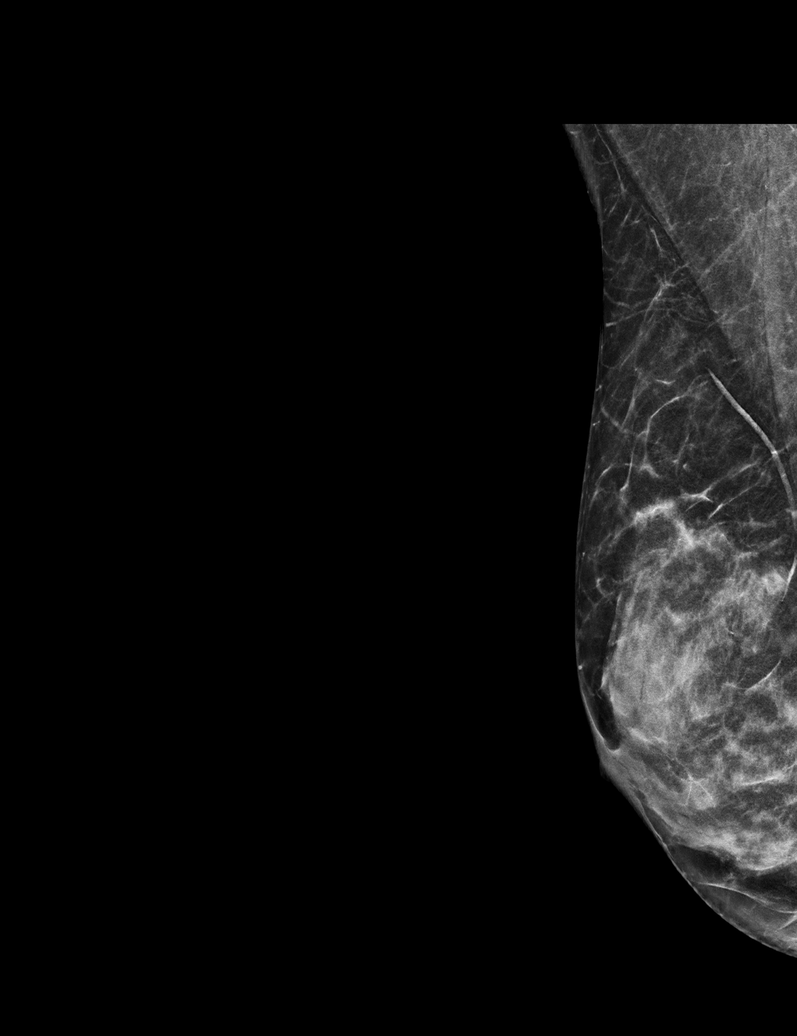

[L CC synth-2D]
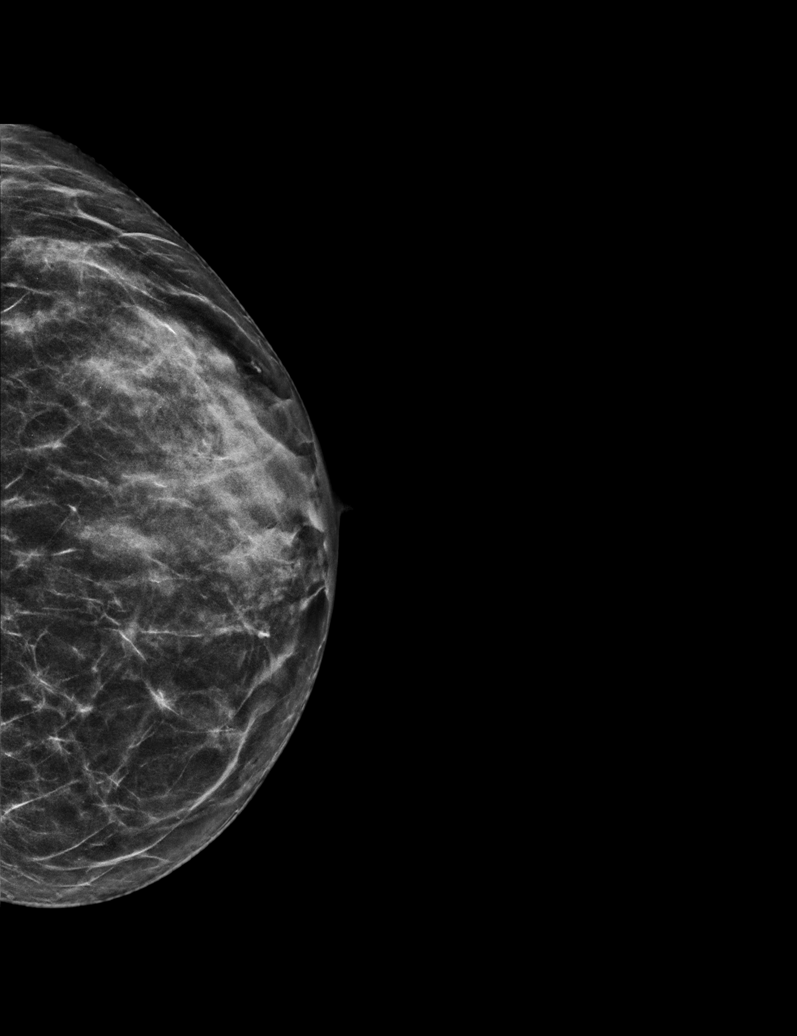

[R CC synth-2D]
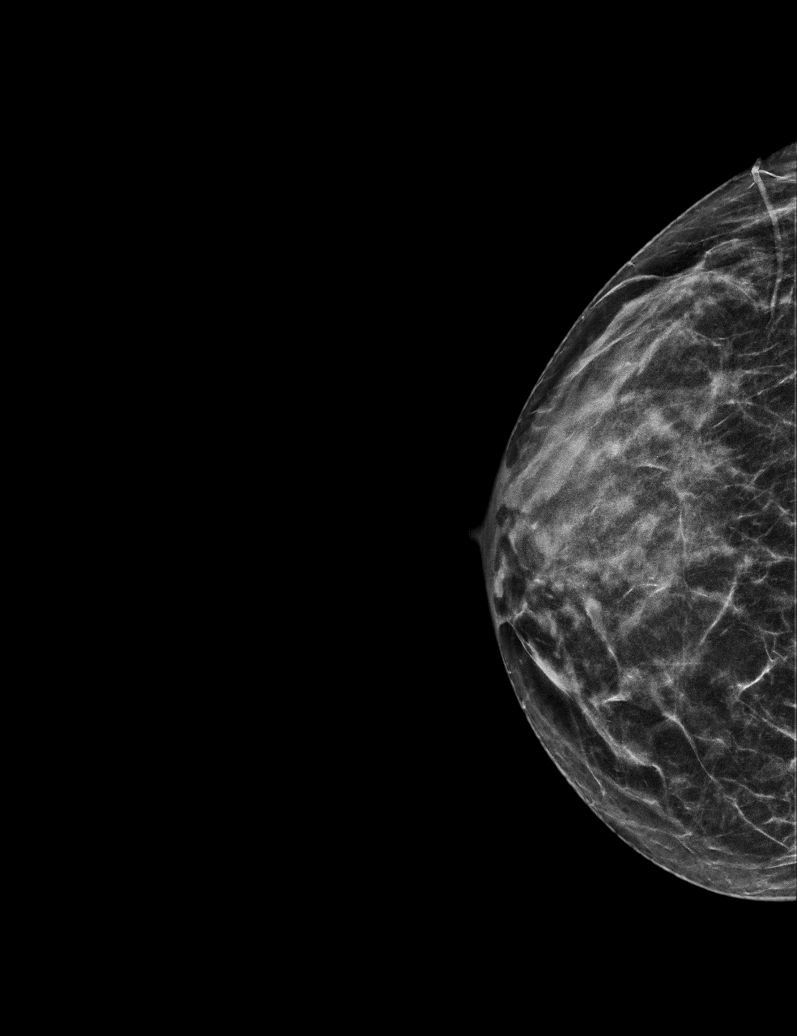

[R MLO synth-2D (2 of 2)]
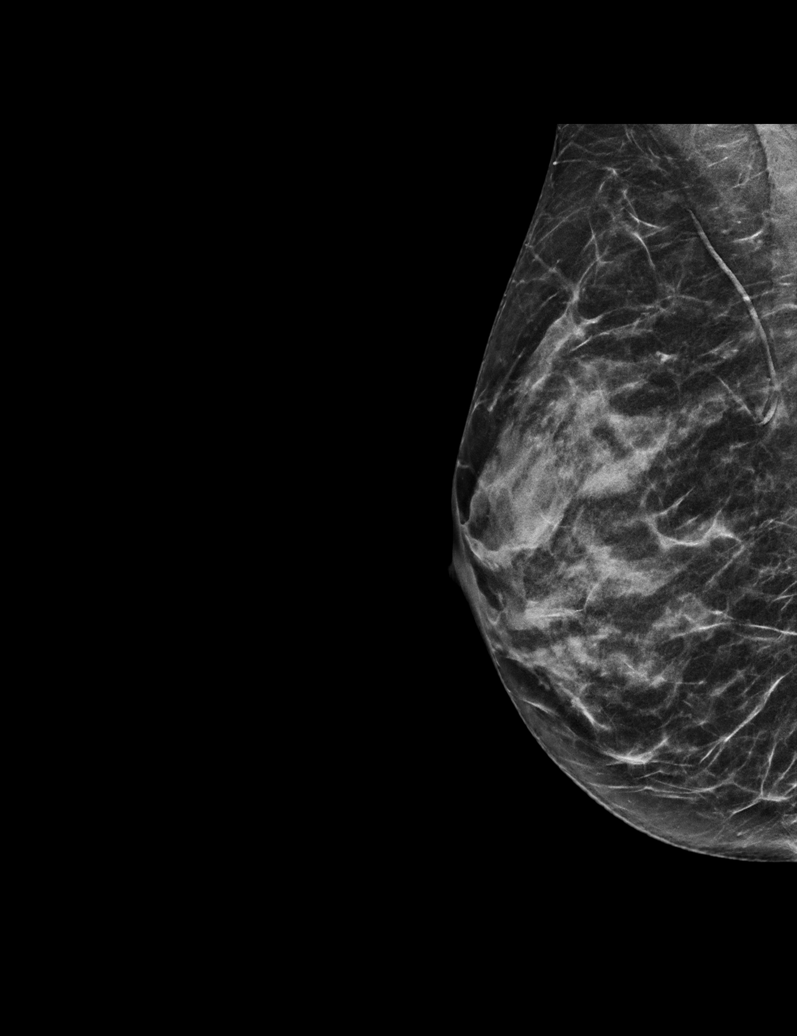

[R MLO tomo · tomo slice 25/49.0]
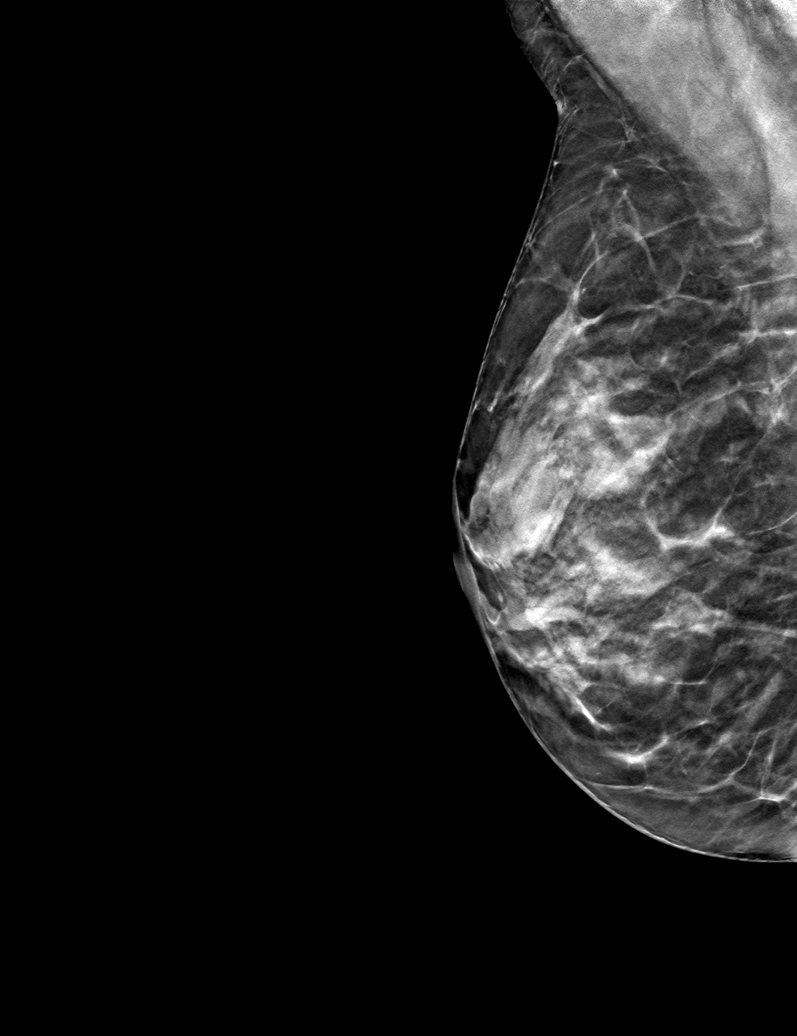

[6 of 30 positions shown; findings below may reference images not displayed]

ACR Breast Density Category c: The breast tissue is heterogeneously
dense, which may obscure small masses.
FINDINGS: There are no findings suspicious for malignancy. Images were
processed with CAD.
IMPRESSION: No mammographic evidence of malignancy. A result letter of this
screening mammogram will be mailed directly to the patient.

RECOMMENDATION:
Screening mammogram in one year. (Code:[5V])

BI-RADS CATEGORY  1: Negative.

## 2019-02-26 ENCOUNTER — Ambulatory Visit (INDEPENDENT_AMBULATORY_CARE_PROVIDER_SITE_OTHER): Payer: Managed Care, Other (non HMO) | Admitting: Sports Medicine

## 2019-02-26 ENCOUNTER — Other Ambulatory Visit: Payer: Self-pay

## 2019-02-26 VITALS — BP 108/74 | Ht 66.0 in | Wt 145.0 lb

## 2019-02-26 DIAGNOSIS — M722 Plantar fascial fibromatosis: Secondary | ICD-10-CM

## 2019-02-26 NOTE — Progress Notes (Addendum)
Alison ISHII - 53 y.o. female MRN [id number]  Date of birth: October 03, 1965  SUBJECTIVE:   CC: chronic plantar fascitis, would like new orthotics   53 yo female presenting for new orthotics. She has had bilateral foot pain for several years. Most recently, her left foot has been bothering her more and she has been doing stretches for plantar fascitis. Went yesterday to have plantar fascia steroid injection by Dr. Noemi Chapel and he recommended that she get new orthotics as she is currently wearing custom orthotics made 20 years ago. Pain in left foot has significantly improved since injection. For exercise, she walks and does yoga.   ROS: No unexpected weight loss, fever, chills, swelling, instability, muscle pain, numbness/tingling, redness, otherwise see HPI   PMHx - Updated and reviewed.  Contributory factors include: Negative PSHx - Updated and reviewed.  Contributory factors include:  Negative FHx - Updated and reviewed.  Contributory factors include:  Negative Social Hx - Updated and reviewed. Contributory factors include: Negative Medications - reviewed   DATA REVIEWED: none  PHYSICAL EXAM:  VS: BP:108/74  HR: bpm  TEMP: ( )  RESP:   HT:5\' 6"  (167.6 cm)   WT:145 lb (65.8 kg)  BMI:23.41 PHYSICAL EXAM: Gen: NAD, alert, cooperative with exam, well-appearing HEENT: clear conjunctiva,  CV:  no edema, capillary refill brisk, normal rate Resp: non-labored Skin: no rashes, normal turgor  Neuro: no gross deficits.  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  No swelling, erythema, or bruising.  Collapsed longitudinal and transverse arches Palpation: No tenderness to palpation ROM: Full  ROM of the ankle. 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 No obvious bony deformity.  No swelling, erythema, or bruising.   Collapsed longitudinal and transverse arches Palpation: No tenderness to palpation ROM: Full  ROM of the ankle. 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 Pain from plantar fascitis improved significantly since corticosteroid injection yesterday by Dr. Noemi Chapel. Reviewed stretches for plantar fascitis.  Patient was fitted for a : standard, cushioned, semi-rigid orthotic. The orthotic was heated and afterward the patient stood on the orthotic blank positioned on the orthotic stand. The patient was positioned in subtalar neutral position and 10 degrees of ankle dorsiflexion in a weight bearing stance. After completion of molding, a stable base was applied to the orthotic blank. The blank was ground to a stable position for weight bearing. Size: 7 Base: blue EVA Posting: none Additional orthotic padding: metatarsal pad bilaterally   Patient seen and evaluated with the sports medicine fellow.  I agree with the above plan of care.  Addendum (March 16, 2019): Patient returned to the office for orthotic adjustment.  Her left orthotic is comfortable but she is complaining that the right orthotic does not feel right.  She is complaining that her heel and toes are coming off the edge of the orthotic.  When I inspected it, it appears to be a little small for the shoe.  Therefore, I made her a larger size 8 orthotic for the left shoe.  This was much more comfortable for her.  Since her right orthotic is not currently uncomfortable I will leave it at a  size 7 for now.  Follow-up as needed.

## 2019-07-01 ENCOUNTER — Other Ambulatory Visit: Payer: Self-pay | Admitting: Obstetrics and Gynecology

## 2019-07-01 ENCOUNTER — Other Ambulatory Visit: Payer: Self-pay | Admitting: Family Medicine

## 2019-07-01 DIAGNOSIS — Z1231 Encounter for screening mammogram for malignant neoplasm of breast: Secondary | ICD-10-CM

## 2019-08-19 ENCOUNTER — Ambulatory Visit
Admission: RE | Admit: 2019-08-19 | Discharge: 2019-08-19 | Disposition: A | Discharge status: Home / Self Care | Payer: Managed Care, Other (non HMO) | Source: Ambulatory Visit | Attending: Family Medicine | Admitting: Family Medicine

## 2019-08-19 ENCOUNTER — Other Ambulatory Visit: Payer: Self-pay

## 2019-08-19 DIAGNOSIS — Z1231 Encounter for screening mammogram for malignant neoplasm of breast: Secondary | ICD-10-CM

## 2019-08-19 IMAGING — MG DIGITAL SCREENING BILAT W/ TOMO W/ CAD
6 of 10 series · 6 of 30 positions shown · non-contrast
Comparison: Previous exam(s).

CLINICAL DATA: Screening.

EXAM:
DIGITAL SCREENING BILATERAL MAMMOGRAM WITH TOMO AND CAD

[L MLO synth-2D]
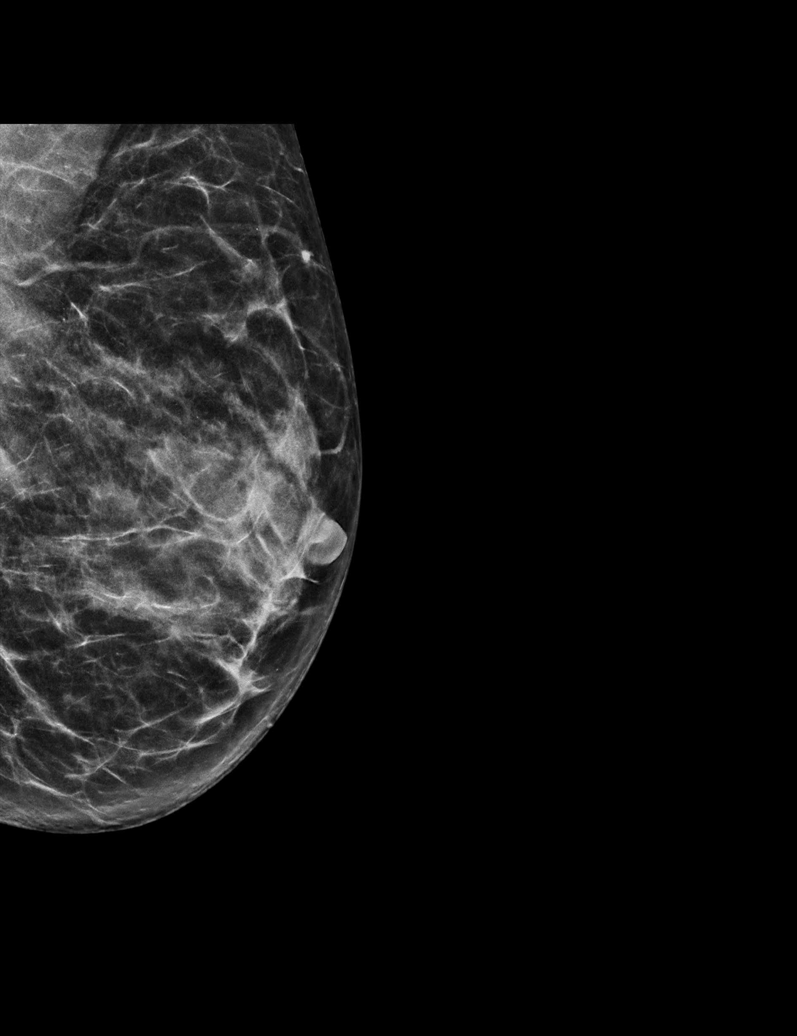

[L CC synth-2D]
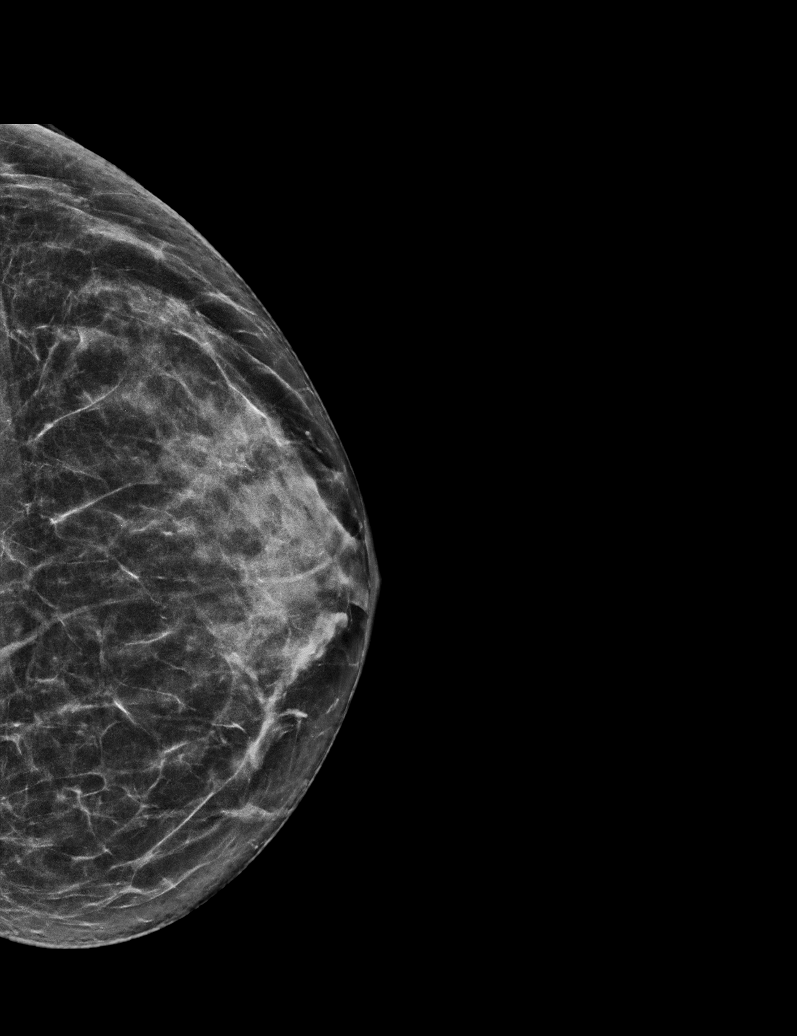

[R CC synth-2D]
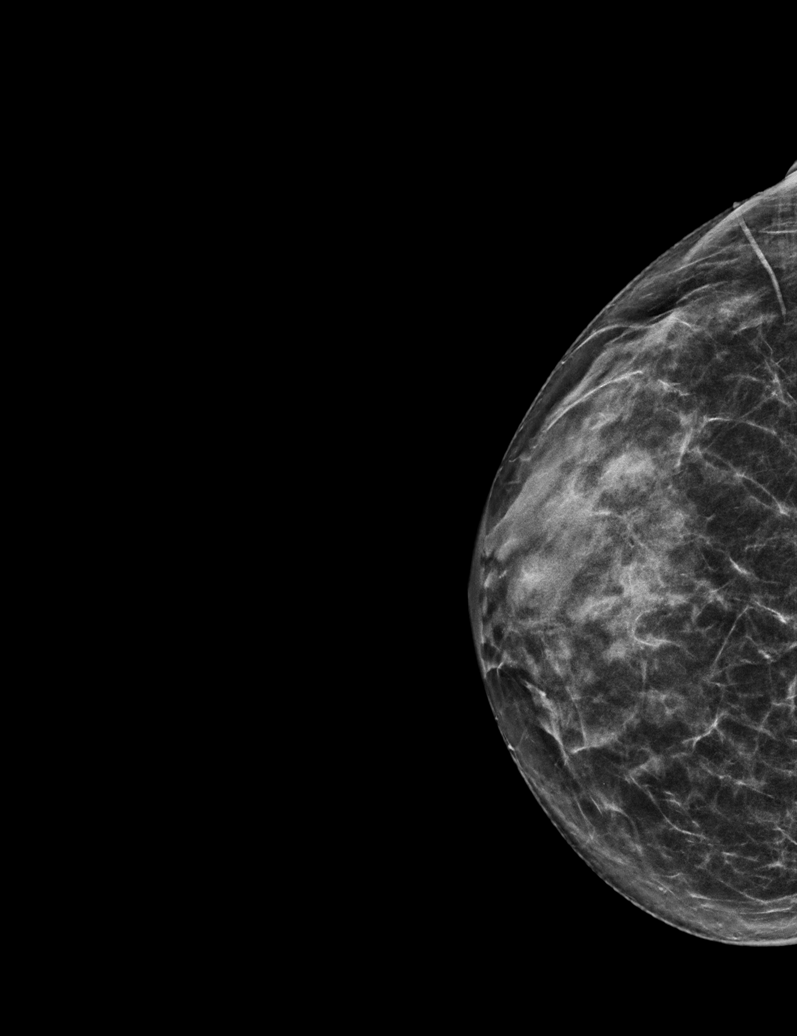

[R MLO synth-2D (1 of 2)]
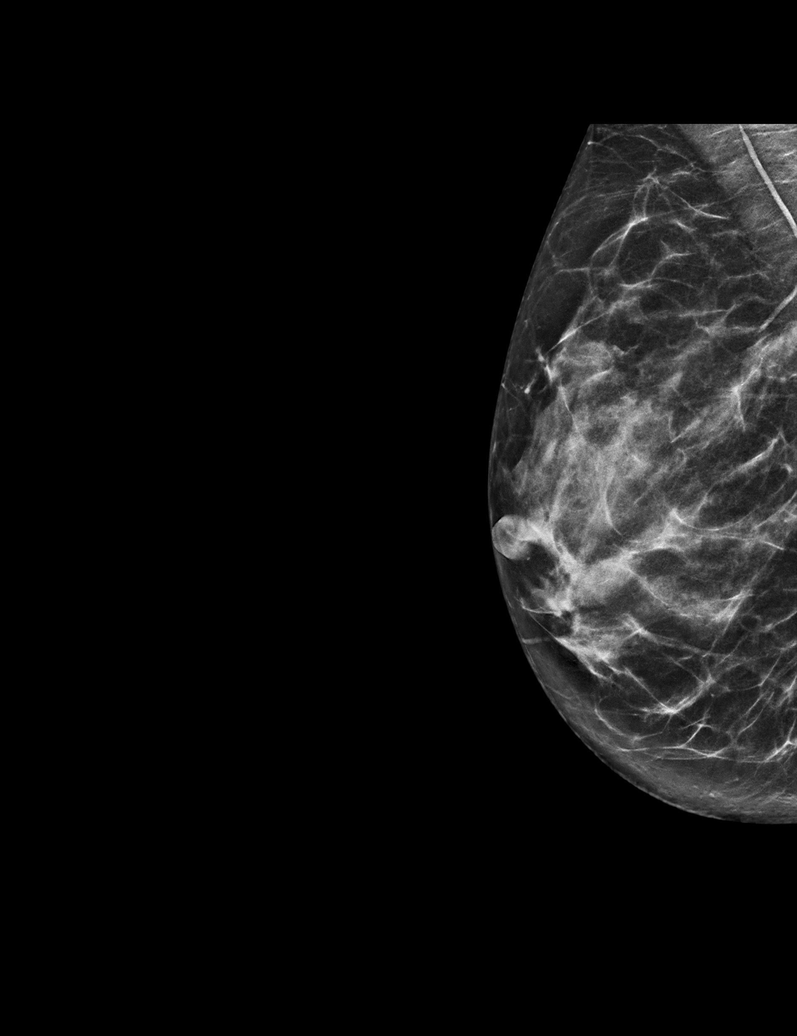

[R MLO synth-2D (2 of 2)]
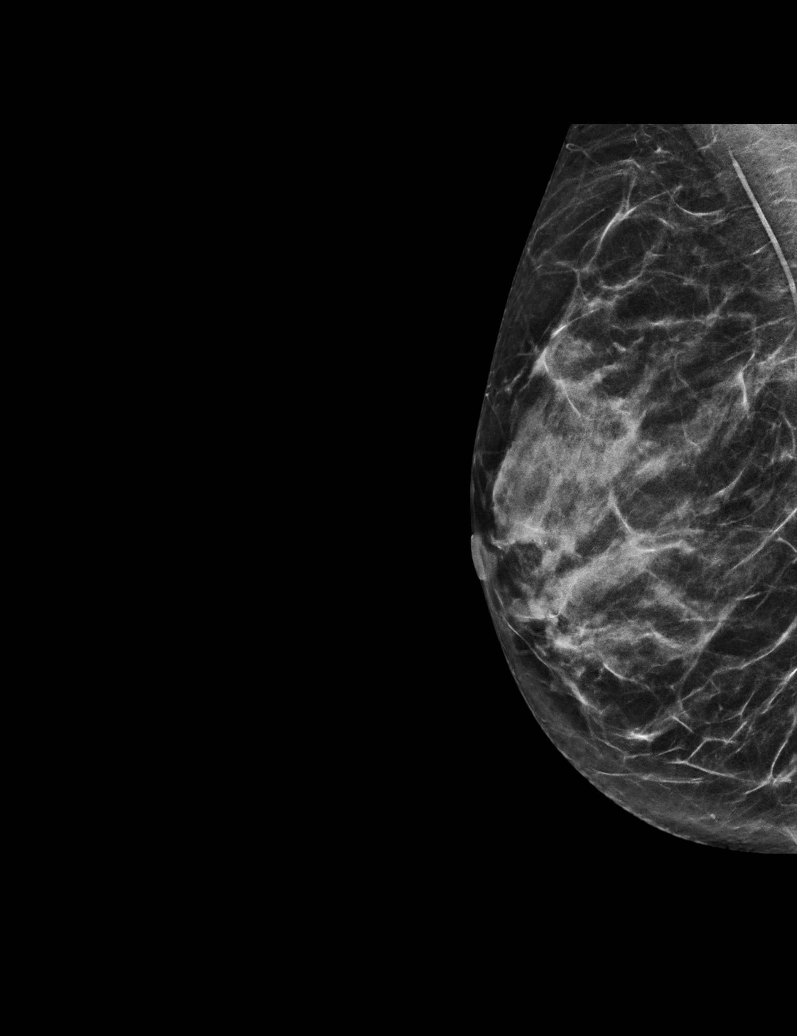

[R MLO tomo · tomo slice 25/48.0]
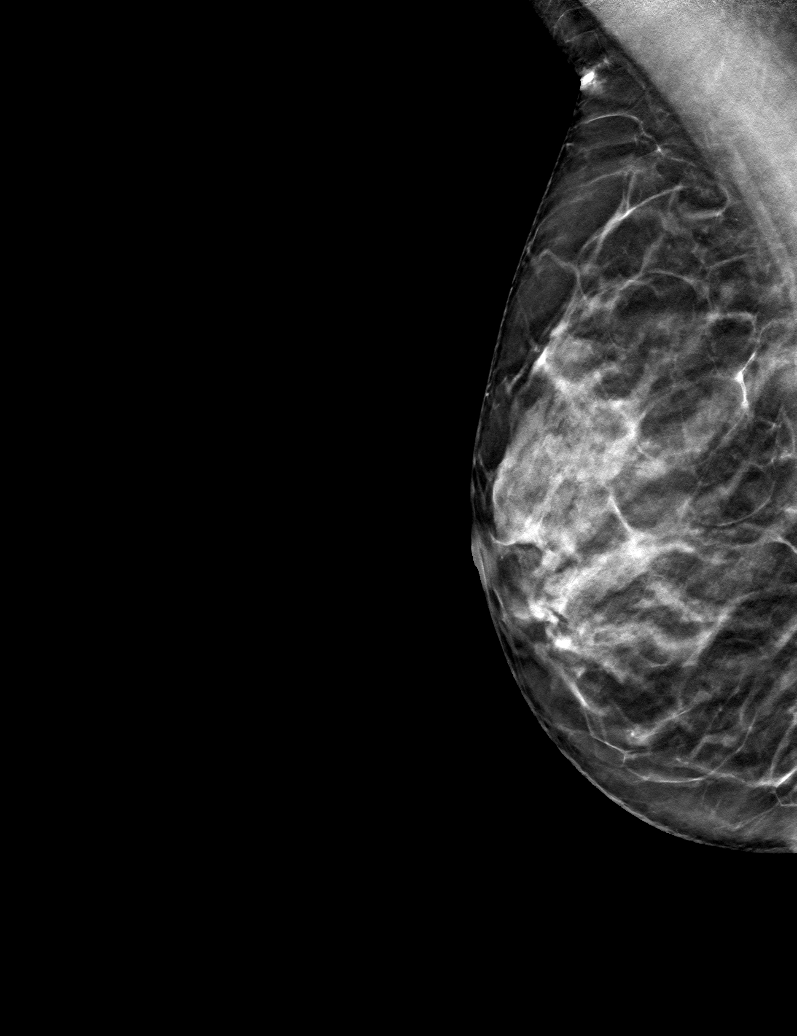

[6 of 30 positions shown; findings below may reference images not displayed]

ACR Breast Density Category c: The breast tissue is heterogeneously
dense, which may obscure small masses.
FINDINGS: There are no findings suspicious for malignancy. Images were
processed with CAD.
IMPRESSION: No mammographic evidence of malignancy. A result letter of this
screening mammogram will be mailed directly to the patient.

RECOMMENDATION:
Screening mammogram in one year. (Code:[5V])

BI-RADS CATEGORY  1: Negative.

## 2019-09-12 ENCOUNTER — Ambulatory Visit: Payer: Managed Care, Other (non HMO) | Attending: Internal Medicine

## 2019-09-12 DIAGNOSIS — Z23 Encounter for immunization: Secondary | ICD-10-CM | POA: Insufficient documentation

## 2019-09-12 NOTE — Progress Notes (Signed)
   Covid-19 Vaccination Clinic  Name:  [name]    MRN: [id number] DOB: [birth year]  09/12/2019  [name] was observed post Covid-19 immunization for 15 minutes without incidence. She was provided with Vaccine Information Sheet and instruction to access the V-Safe system.   [name] was instructed to call 911 with any severe reactions post vaccine: Marland Kitchen Difficulty breathing  . Swelling of your face and throat  . A fast heartbeat  . A bad rash all over your body  . Dizziness and weakness    Immunizations Administered    Name Date Dose VIS Date Route   Pfizer COVID-19 Vaccine 09/12/2019 10:34 AM 0.3 mL 06/26/2019 Intramuscular   Manufacturer: [employer], [employer]   Lot: [id number]   NDC: [address]-1

## 2019-10-03 ENCOUNTER — Ambulatory Visit: Payer: Managed Care, Other (non HMO)

## 2019-10-07 ENCOUNTER — Ambulatory Visit: Payer: Managed Care, Other (non HMO)

## 2019-10-09 ENCOUNTER — Ambulatory Visit: Payer: Managed Care, Other (non HMO) | Attending: Internal Medicine

## 2019-10-09 DIAGNOSIS — Z23 Encounter for immunization: Secondary | ICD-10-CM

## 2019-10-09 NOTE — Progress Notes (Signed)
   Covid-19 Vaccination Clinic  Name:  [name]    MRN: [id number] DOB: [birth year]  10/09/2019  [name] was observed post Covid-19 immunization for 30 minutes without incident. She was provided with Vaccine Information Sheet and instruction to access the V-Safe system.   [name] was instructed to call 911 with any severe reactions post vaccine: Marland Kitchen Difficulty breathing  . Swelling of face and throat  . A fast heartbeat  . A bad rash all over body  . Dizziness and weakness   Immunizations Administered    Name Date Dose VIS Date Route   Pfizer COVID-19 Vaccine 10/09/2019  4:28 PM 0.3 mL 06/26/2019 Intramuscular   Manufacturer: [employer], [employer]   Lot: [id number]   NDC: [address]-8

## 2020-03-28 ENCOUNTER — Ambulatory Visit: Payer: Self-pay | Admitting: Podiatrist

## 2020-07-20 ENCOUNTER — Other Ambulatory Visit: Payer: Self-pay | Admitting: Family Medicine

## 2020-07-20 DIAGNOSIS — Z1231 Encounter for screening mammogram for malignant neoplasm of breast: Secondary | ICD-10-CM

## 2020-08-26 ENCOUNTER — Ambulatory Visit
Admission: RE | Admit: 2020-08-26 | Discharge: 2020-08-26 | Disposition: A | Discharge status: Home / Self Care | Payer: Managed Care, Other (non HMO) | Source: Ambulatory Visit | Attending: Family Medicine | Admitting: Family Medicine

## 2020-08-26 ENCOUNTER — Other Ambulatory Visit: Payer: Self-pay

## 2020-08-26 DIAGNOSIS — Z1231 Encounter for screening mammogram for malignant neoplasm of breast: Secondary | ICD-10-CM

## 2020-08-26 IMAGING — MG MM DIGITAL SCREENING BILAT W/ TOMO AND CAD
6 of 10 series · 6 of 30 positions shown · non-contrast
Comparison: Previous exam(s).

CLINICAL DATA: Screening.

EXAM:
DIGITAL SCREENING BILATERAL MAMMOGRAM WITH TOMOSYNTHESIS AND CAD
TECHNIQUE: Bilateral screening digital craniocaudal and mediolateral oblique
mammograms were obtained. Bilateral screening digital breast
tomosynthesis was performed. The images were evaluated with
computer-aided detection.

[R MLO synth-2D (1 of 2)]
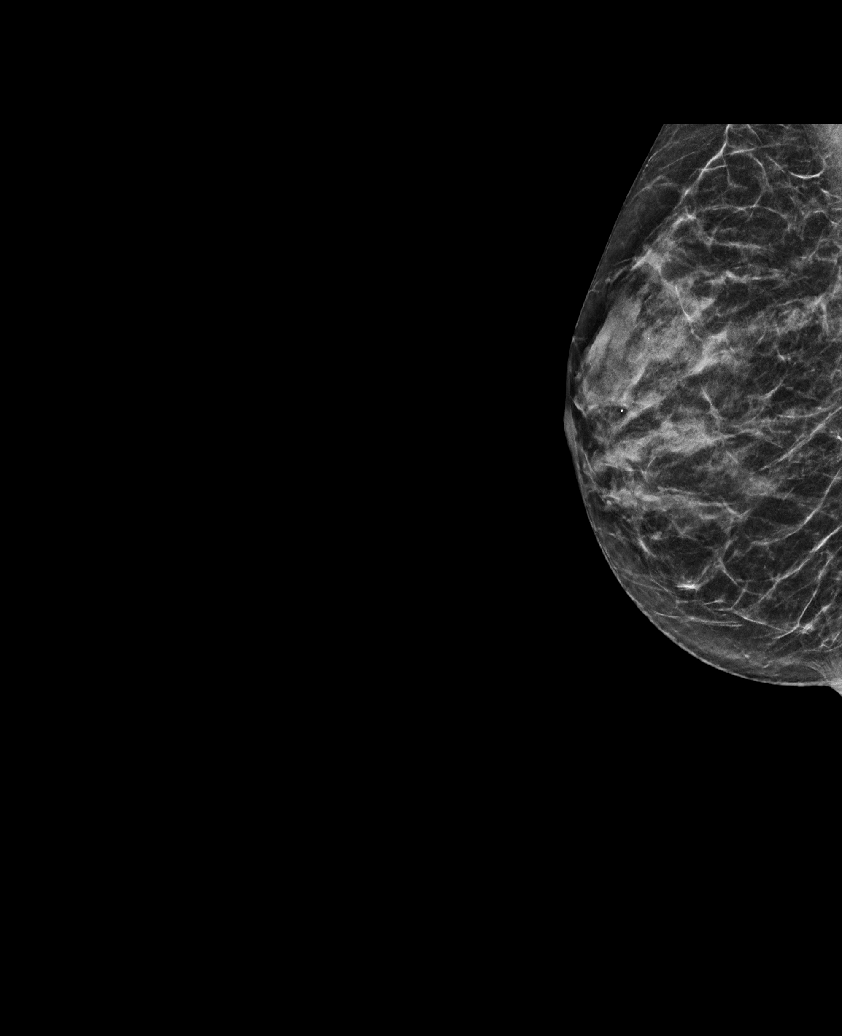

[R MLO synth-2D (2 of 2)]
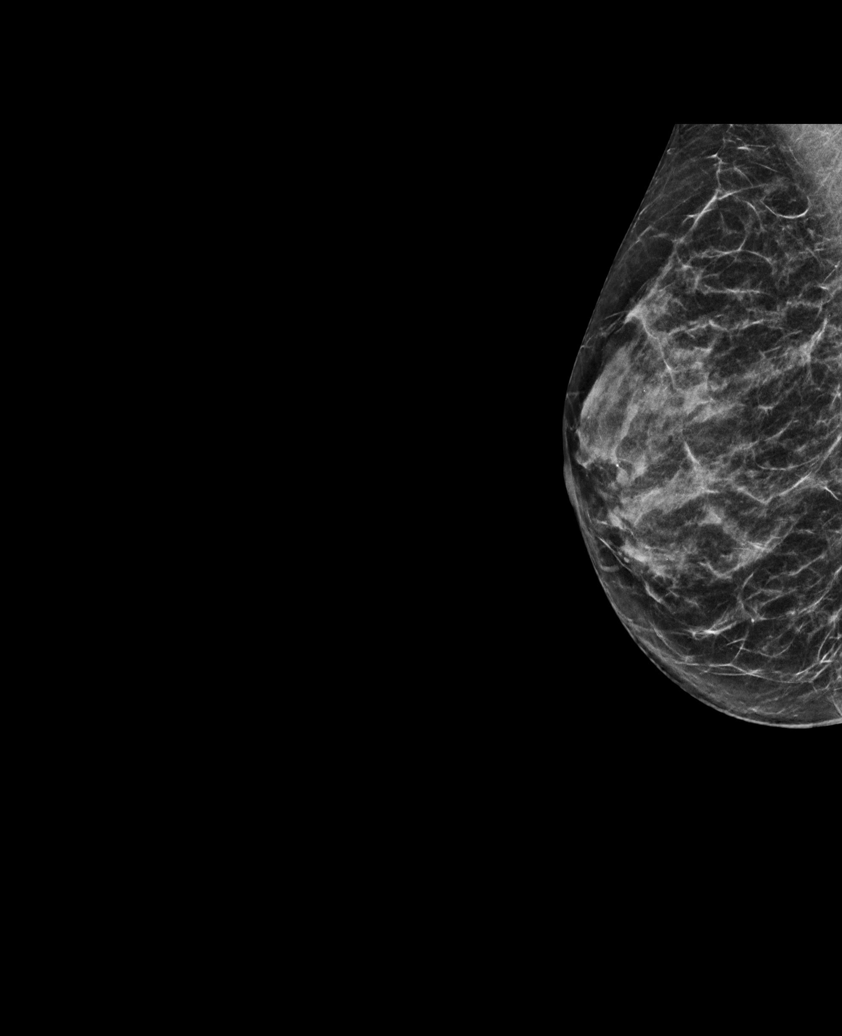

[L CC synth-2D]
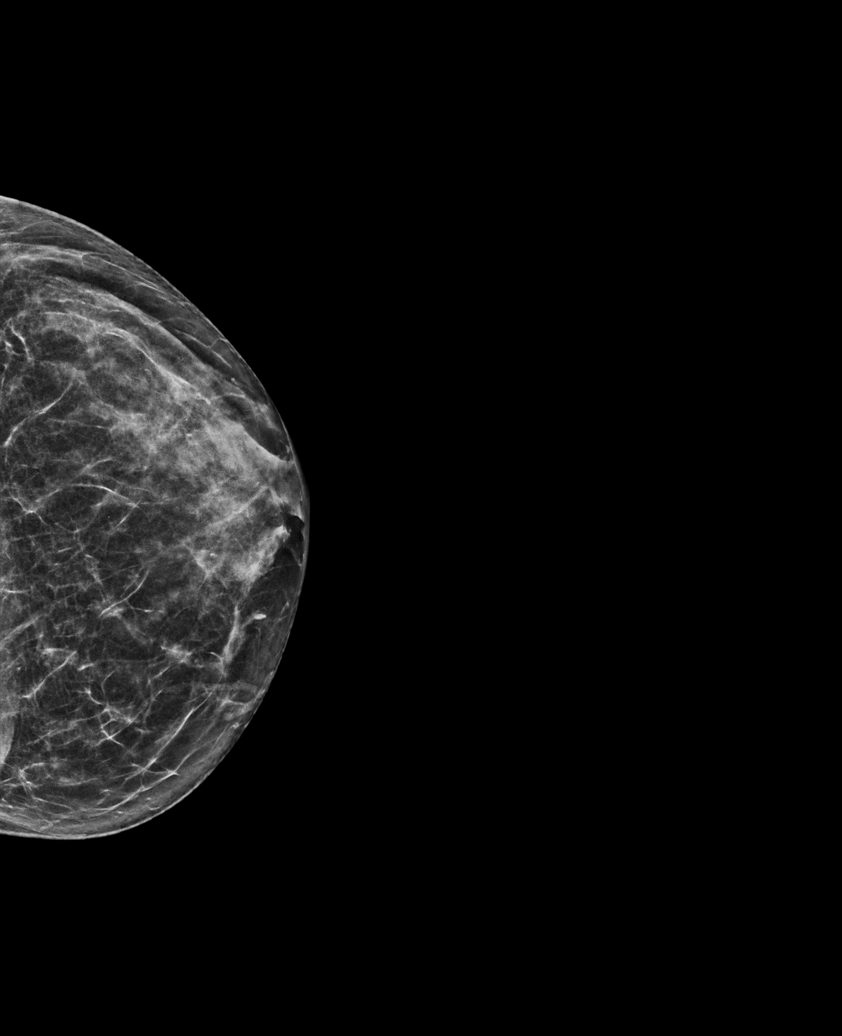

[R CC synth-2D]
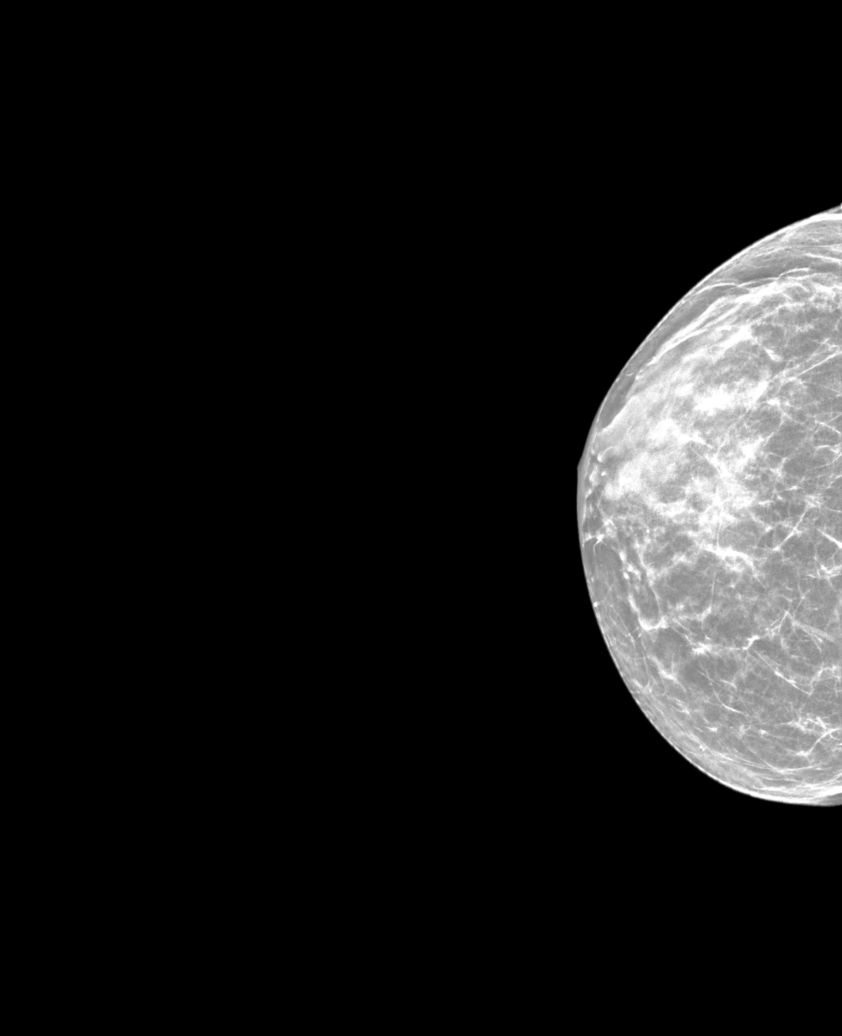

[L MLO synth-2D]
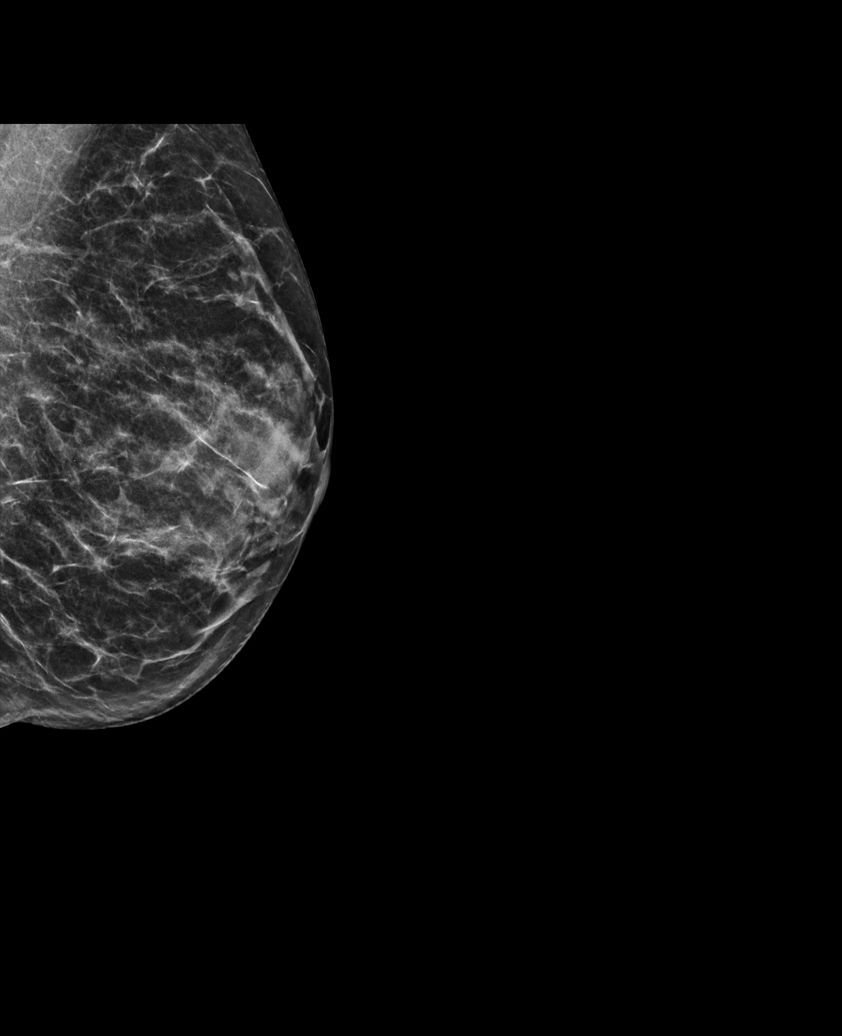

[L CC tomo · tomo slice 28/55.0]
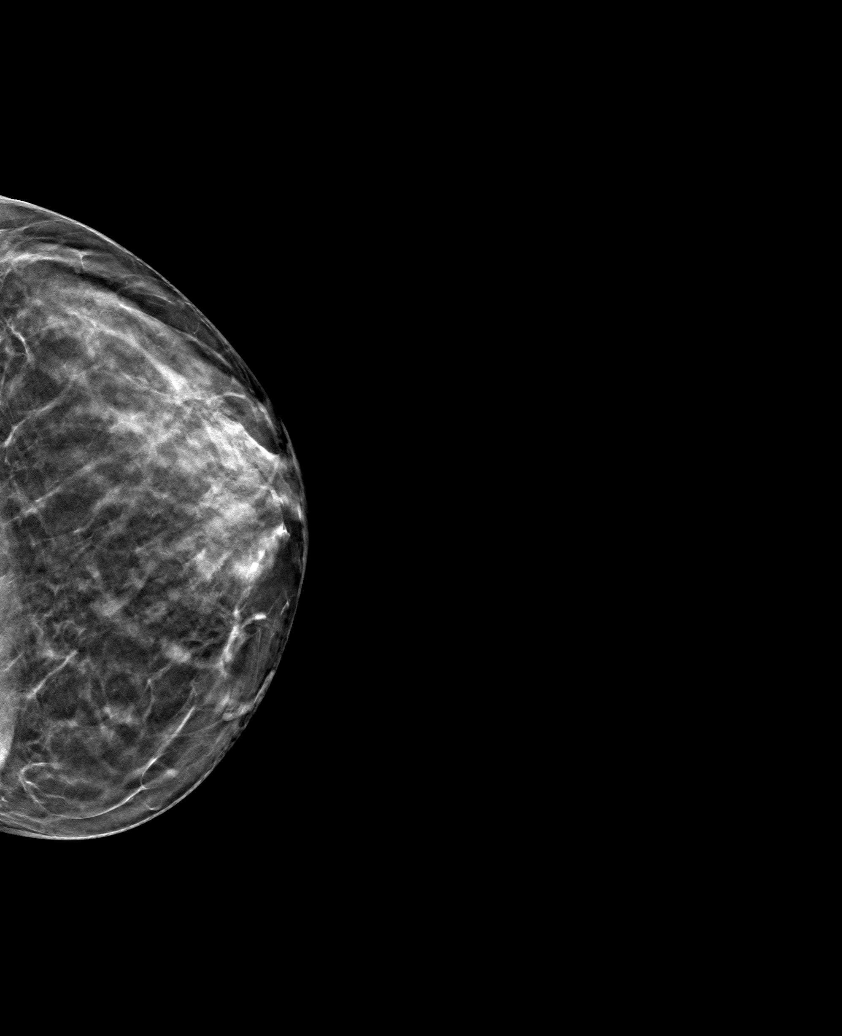

[6 of 30 positions shown; findings below may reference images not displayed]

ACR Breast Density Category c: The breast tissue is heterogeneously
dense, which may obscure small masses.
FINDINGS: There are no findings suspicious for malignancy.
IMPRESSION: No mammographic evidence of malignancy. A result letter of this
screening mammogram will be mailed directly to the patient.

RECOMMENDATION:
Screening mammogram in one year. (Code:[V2])

BI-RADS CATEGORY  1: Negative.

## 2020-09-01 ENCOUNTER — Other Ambulatory Visit: Payer: Self-pay | Admitting: Podiatry

## 2020-09-01 ENCOUNTER — Ambulatory Visit: Payer: Self-pay

## 2020-09-01 ENCOUNTER — Ambulatory Visit: Payer: Managed Care, Other (non HMO) | Admitting: Podiatry

## 2020-09-01 ENCOUNTER — Other Ambulatory Visit: Payer: Self-pay

## 2020-09-01 ENCOUNTER — Ambulatory Visit (INDEPENDENT_AMBULATORY_CARE_PROVIDER_SITE_OTHER): Payer: Managed Care, Other (non HMO)

## 2020-09-01 DIAGNOSIS — M722 Plantar fascial fibromatosis: Secondary | ICD-10-CM

## 2020-09-01 DIAGNOSIS — G8929 Other chronic pain: Secondary | ICD-10-CM

## 2020-09-01 DIAGNOSIS — M79672 Pain in left foot: Secondary | ICD-10-CM

## 2020-09-01 DIAGNOSIS — M778 Other enthesopathies, not elsewhere classified: Secondary | ICD-10-CM

## 2020-09-01 MED ORDER — TRIAMCINOLONE ACETONIDE 10 MG/ML IJ SUSP
10.0000 mg | Freq: Once | INTRAMUSCULAR | Status: AC
  Administered 2020-09-01: 10 mg

## 2020-09-01 MED ORDER — METHYLPREDNISOLONE 4 MG PO TBPK
ORAL_TABLET | ORAL | 0 refills | Status: DC
Start: 2020-09-01 — End: 2022-08-15

## 2020-09-01 NOTE — Patient Instructions (Signed)

## 2020-09-06 NOTE — Progress Notes (Signed)
Subjective:   Patient ID: [name], female   DOB: 55 y.o.   MRN: [id number]   HPI 55 year old female presents the office today for concerns of left foot pain which is been ongoing for the last 2 years.  She has been physical therapy for the last 4 months and she is continued pain mostly upon the heel pointing to the plantar lateral aspect.  She denies any recent injury or trauma.  No significant swelling currently.  She has tried shoe modifications and bracing instruction exercises as well.  Denies any rating pain or weakness.  She has no other concerns today.   Review of Systems  All other systems reviewed and are negative.  Past Medical History:  Diagnosis Date  . Allergy   . Breast mass, right   . Complication of anesthesia    hard to wake up  . GERD (gastroesophageal reflux disease)   . Seizures (HCC)    as teen, no meds since age 50, no seizures    Past Surgical History:  Procedure Laterality Date  . ABDOMINAL HYSTERECTOMY  2006   AP repair also  . BREAST BIOPSY Right 08/31/2015  . BREAST EXCISIONAL BIOPSY Right 09/23/2015  . BREAST LUMPECTOMY WITH RADIOACTIVE SEED LOCALIZATION Right 09/23/2015   Procedure: BREAST LUMPECTOMY WITH RADIOACTIVE SEED LOCALIZATION;  Surgeon: Chevis Pretty III, MD;  Location: [facility];  Service: General;  Laterality: Right;  . BUNIONECTOMY Bilateral 1986  . EYE SURGERY     lasik  . ROTATOR CUFF REPAIR Right 2012  . URETER SURGERY     as child     Current Outpatient Medications:  .  methylPREDNISolone (MEDROL DOSEPAK) 4 MG TBPK tablet, Take as directed, Disp: 21 tablet, Rfl: 0 .  acetaminophen (TYLENOL) 500 MG tablet, Take 1,000 mg by mouth every 6 (six) hours as needed (for pain.). , Disp: , Rfl:  .  cephALEXin (KEFLEX) 500 MG capsule, Take 1 capsule (500 mg total) by mouth 3 (three) times daily. x10 days (Patient not taking: Reported on 04/14/2018), Disp: 30 capsule, Rfl: 0 .  cycloSPORINE (RESTASIS) 0.05 % ophthalmic  emulsion, 1 drop 2 (two) times daily., Disp: , Rfl:  .  Flaxseed, Linseed, (FLAX SEED OIL PO), Take 1 capsule by mouth daily. , Disp: , Rfl:  .  ibuprofen (ADVIL,MOTRIN) 200 MG tablet, Take 200 mg by mouth every 6 (six) hours as needed (for pain.)., Disp: , Rfl:  .  Multiple Vitamins-Minerals (AIRBORNE PO), Take 1 tablet by mouth 3 (three) times daily as needed (for immune health)., Disp: , Rfl:  .  Multiple Vitamins-Minerals (EMERGEN-C IMMUNE PLUS PO), Take 1 packet by mouth 2 (two) times daily as needed. , Disp: , Rfl:  .  Multiple Vitamins-Minerals (MULTIVITAMIN WITH MINERALS) tablet, Take 1 tablet by mouth daily., Disp: , Rfl:  .  NON FORMULARY, Inject 1 Syringe into the muscle every 30 (thirty) days. Allergy Shot received from [facility], Disp: , Rfl:  .  omeprazole (PRILOSEC) 20 MG capsule, Take 20 mg by mouth daily., Disp: , Rfl:  .  oxyCODONE-acetaminophen (ROXICET) 5-325 MG tablet, Take 1-2 tablets by mouth every 4 (four) hours as needed. (Patient not taking: Reported on 11/13/2015), Disp: 50 tablet, Rfl: 0 .  tretinoin (RETIN-A) 0.01 % gel, Apply 1 application topically 2 (two) times a week. , Disp: , Rfl:  .  triamcinolone (KENALOG) 0.1 % paste, Use as directed 1 application in the mouth or throat 2 (two) times daily as needed. For  ulcers, Disp: , Rfl: 1  Allergies  Allergen Reactions  . Bee Venom Anaphylaxis  . Sulfa Antibiotics Hives  . Latex Rash         Objective:  Physical Exam  General: AAO x3, NAD  Dermatological: Skin is warm, dry and supple bilateral. There are no open sores, no preulcerative lesions, no rash or signs of infection present.  Vascular: Dorsalis Pedis artery and Posterior Tibial artery pedal pulses are 2/4 bilateral with immedate capillary fill time. There is no pain with calf compression, swelling, warmth, erythema.   Neruologic: Grossly intact via light touch bilateral. Negative tinel sign.  Musculoskeletal: Tenderness to palpation along  the plantar tubercle of the calcaneus at the insertion of plantar fascia on the left foot. There is no pain along the course of the plantar fascia within the arch of the foot. Plantar fascia appears to be intact. There is no pain with lateral compression of the calcaneus or pain with vibratory sensation. There is no pain along the course or insertion of the achilles tendon. No other areas of tenderness to bilateral lower extremities. Muscular strength 5/5 in all groups tested bilateral.  Gait: Unassisted, Nonantalgic.       Assessment:   Left heel pain, likely chronic fasciitis    Plan:  -Treatment options discussed including all alternatives, risks, and complications -Etiology of symptoms were discussed -Unable to get x-rays today due to machine not working.  Will get x-rays next appointment. -Steroid injection performed.  See procedure note below. -Medrol dose pack -Continue stretching, icing daily.  Continue with physical therapy and continue with supportive shoes, inserts, bracing.  Procedure: Injection Tendon/Ligament Discussed alternatives, risks, complications and verbal consent was obtained.  Location: LEFT plantar fascia at the glabrous junction; medial and lateral approach. Skin Prep: Alcohol  Injectate: 0.5cc 0.5% marcaine plain, 0.5 cc 2% lidocaine plain and, 1 cc kenalog 10. Disposition: Patient tolerated procedure well. Injection site dressed with a band-aid.  Post-injection care was discussed and return precautions discussed.   Return in about 3 weeks (around 09/22/2020).  Vivi Barrack DPM

## 2020-09-22 ENCOUNTER — Ambulatory Visit: Payer: Managed Care, Other (non HMO) | Admitting: Podiatry

## 2020-09-22 ENCOUNTER — Other Ambulatory Visit: Payer: Self-pay

## 2020-09-22 DIAGNOSIS — G8929 Other chronic pain: Secondary | ICD-10-CM | POA: Diagnosis not present

## 2020-09-22 DIAGNOSIS — M722 Plantar fascial fibromatosis: Secondary | ICD-10-CM | POA: Diagnosis not present

## 2020-09-22 DIAGNOSIS — M79672 Pain in left foot: Secondary | ICD-10-CM | POA: Diagnosis not present

## 2020-09-27 NOTE — Progress Notes (Signed)
Subjective: 55 year old female presents the office today for follow-up evaluation of left foot pain, heel pain.  She states that she is about 70% improved.  She states that her pain level is 4-5/10.  Overall she is improving the swelling soreness.  She had no complications after the steroid injection.  She finished Medrol Dosepak as well.  She is still doing physical therapy weekly. Denies any systemic complaints such as fevers, chills, nausea, vomiting. No acute changes since last appointment, and no other complaints at this time.   Objective: AAO x3, NAD DP/PT pulses palpable bilaterally, CRT less than 3 seconds There is still tenderness palpation on plantar aspect the calcaneus at the insertion of plantar fascia.  There is no pain with lateral compression of calcaneus.  No edema, erythema.  Flexor, extensor tendons appear to be intact.  MMT 5/5.  Negative Tinel sign. No pain with calf compression, swelling, warmth, erythema  Assessment: Improving heel pain left heel  Plan: -All treatment options discussed with the patient including all alternatives, risks, complications.  -We held off another steroid injection today.  We discussed continued physical therapy.  Continue with wearing supportive shoes which we discussed in detail different shoe modification that she brought into the office today.  Continue home stretching, icing daily. -Patient encouraged to call the office with any questions, concerns, change in symptoms.   Vivi Barrack DPM

## 2020-11-04 ENCOUNTER — Other Ambulatory Visit: Payer: Managed Care, Other (non HMO)

## 2020-11-11 ENCOUNTER — Other Ambulatory Visit: Payer: Managed Care, Other (non HMO)

## 2020-11-18 ENCOUNTER — Other Ambulatory Visit: Payer: Managed Care, Other (non HMO)

## 2020-11-29 ENCOUNTER — Ambulatory Visit: Payer: Managed Care, Other (non HMO) | Admitting: Sports Medicine

## 2020-11-29 ENCOUNTER — Other Ambulatory Visit: Payer: Self-pay

## 2020-12-13 ENCOUNTER — Encounter: Payer: Self-pay | Admitting: Sports Medicine

## 2020-12-13 ENCOUNTER — Ambulatory Visit: Payer: Managed Care, Other (non HMO) | Admitting: Sports Medicine

## 2020-12-13 ENCOUNTER — Other Ambulatory Visit: Payer: Self-pay

## 2020-12-13 VITALS — BP 110/70 | Ht 66.0 in | Wt 140.0 lb

## 2020-12-13 DIAGNOSIS — M722 Plantar fascial fibromatosis: Secondary | ICD-10-CM | POA: Insufficient documentation

## 2020-12-13 NOTE — Progress Notes (Signed)
PCP: Maurice Small, MD  Subjective:   HPI: Patient is a 55 y.o. female here for evaluation of left heel pain.  She has been having left heel pain for about 2 years now.  She was originally seen here for it on 02/26/2019, at that time was diagnosed with left plantar fasciitis.  She was fitted for a custom orthotic, and also was referred for physical therapy.  She reports that the orthotic was somewhat helpful, and physical therapy was also fairly helpful.  She did this for about 5 months, and included gastroc/soleus stretching and strengthening.  She did continue to have pain but felt it is manageable until it started getting worse more recently.  Most recently, she has been seen by Dr. Ardelle Anton, D.P.M. who diagnosed her with left plantar fasciitis, and trialed corticosteroid injection into the plantar fascia.  She reports that she did have about 3 to 4 days of benefit with return of symptoms.  She is also tried several different shoes, notes that her high heels actually is the most comfortable, closely followed by [employer] with orthotics in.  Today, patient states that the pain is mostly in the medial aspect of her left heel, but she is also having pain laterally.  She has not noticed any numbness or tingling.  The pain is worse after a day of prolonged standing.  She does not have significant pain while she is standing or walking around, rather it tightens up and is painful afterwards.  Pain with first steps in the morning as well.   Review of Systems:  Per HPI.   PMFSH, medications and smoking status reviewed.      Objective:  Physical Exam:  No flowsheet data found.   Gen: awake, alert, NAD, comfortable in exam room Pulm: breathing unlabored  Left foot: -Inspection: No significant swelling, edema, erythema or warmth at the heel.  With standing, patient has some loss of transverse arch with splaying of the second and third toes.  She has mild navicular drop and mild calcaneal  valgus bilaterally which is equal.  With walking, mild calcaneal valgus and longitudinal arch collapse seen without pes planus.  With shoes/orthotics on, she has near complete correction of this. -Palpation: Tender to palpation at the medial plantar band insertion and at the lateral plantar band insertion. -ROM: Full range of motion of the foot and ankle -Strength: 5/5 strength in all planes of the foot and ankle -Neurovascular intact distally, negative Tinel's at Baxter's nerve  Limited US examination of left heel: -Medial band of plantar fascia was identified and is 0.6 cm in thickness (compared to 0.3 cm on contralateral side).  The plantar fascia appears hypoechoic in nature and thickened.  There is also some fascial thickening at the deep aspect of the plantar fascia distal to its insertion.  No signs of tear. -Lateral band of plantar fascia identified and shows fascial thickening as well.  There is a calcaneal spur at the lateral band insertion as well.  No significant tear.  Impression: -Medial and lateral band plantar fasciitis with lateral calcaneal spur  Ultrasound performed interpreted by Guy Sandifer, MD and Juluis Rainier, MD   Assessment & Plan:  1.  Left plantar fasciitis  Patient was signed symptoms consistent with plantar fasciitis and ultrasound findings which confirm this.  She has tried most conservative therapies including PT, stretching exercises, orthotics, heel lifts, corticosteroid injection.  At this point, options include ECSWT, nitroglycerin patches.  She would like to proceed with use  ESWT.  Plan: -First round of ECSWT performed today, follow-up in 1 week for repeat -HEP given with focus on plantar fascial stretching, eccentric calf raises, toe walks -Heel lifts placed in orthotics  We will follow-up in 1 week for repeat ECSWT.    Guy Sandifer, MD [facility] Fellow 12/13/2020 9:24 AM   I observed and examined the patient with the [facility] resident  and agree with assessment and plan.  Note reviewed and modified by me. Good candidate to try ESWT. Sterling Big, MD

## 2020-12-13 NOTE — Assessment & Plan Note (Signed)
Good candidate to try ESWT Start today and weekly

## 2020-12-20 ENCOUNTER — Encounter: Payer: Self-pay | Admitting: Sports Medicine

## 2020-12-20 ENCOUNTER — Ambulatory Visit (INDEPENDENT_AMBULATORY_CARE_PROVIDER_SITE_OTHER): Payer: Self-pay | Admitting: Sports Medicine

## 2020-12-20 ENCOUNTER — Other Ambulatory Visit: Payer: Self-pay

## 2020-12-20 VITALS — Ht 66.0 in

## 2020-12-20 DIAGNOSIS — M722 Plantar fascial fibromatosis: Secondary | ICD-10-CM

## 2020-12-20 NOTE — Progress Notes (Signed)
ECSWT Note [name] 08/22/1965   Aften is a 55yo F who presents for her second ESWT treatment of her left plantar fascial.  At previous visit, ultrasound showed thickening of the medial band of the plantar fascia along with calcification within the lateral band.  She received ESWT on both the medial and lateral bands at last visit.  Today, she reports that she has had noticeable improvement in her symptoms.  She had increased range of motion and pain-free walking the day after the treatment and feels like she is improving.  She does continue to have pain.  No adverse side effects from initial treatment.   Procedure: ECSWT Indications: Left chronic plantar fasciitis  Procedure Details Consent: Risks of procedure as well as the alternatives and risks of each were explained to the (patient/caregiver).  Consent for procedure obtained. Time Out: Verified patient identification, verified procedure, site/side was marked, verified correct patient position, special equipment/implants available, medications/allergies/relevent history reviewed, required imaging and test results available.  Performed.  The area was cleaned with iodine and alcohol swabs.    The medial and lateral bands of the left plantar fascia insertion onto the calcaneus was targeted for Extracorporeal shockwave therapy.    Power Level: 90 Frequency: 12 Impulse/cycles: 3000 total, alternating between medial and lateral bands Head size: Normal Session: 2  Patient did tolerate procedure well.  We will plan to follow-up in 1 week for her third session.  We discussed that we will see how she does over the upcoming sessions and that chronic plantar fasciitis likely require several sessions before we reach plateau in improvement.  Guy Sandifer, MD Sports Medicine Fellow, [place]  I observed and examined the patient with the [facility] resident and agree with assessment and plan.  Note reviewed and modified by me. Sterling Big,  MD

## 2020-12-29 ENCOUNTER — Other Ambulatory Visit: Payer: Self-pay

## 2020-12-29 ENCOUNTER — Ambulatory Visit (INDEPENDENT_AMBULATORY_CARE_PROVIDER_SITE_OTHER): Payer: Self-pay | Admitting: Sports Medicine

## 2020-12-29 VITALS — Ht 66.0 in

## 2020-12-29 DIAGNOSIS — M722 Plantar fascial fibromatosis: Secondary | ICD-10-CM

## 2020-12-29 NOTE — Progress Notes (Signed)
ECSWT Note NESSIE NONG 23-Feb-1966   [name] is a 55yo F who presents for her third ESWT treatment of her left plantar fascia.  After last treatment, patient stated that she had significant soreness over the lateral aspect of her foot.  This has slowly started to improve over the past week, but she is worried about exacerbating it again.  She continues to do her stretches and exercises daily.  She states that thus far the most significant improvement she has noticed was after physical therapy taping, and is wondering if she can get instructions on how to do this on her own.  Procedure: ECSWT Indications: Left chronic plantar fasciitis  Procedure Details Consent: Risks of procedure as well as the alternatives and risks of each were explained to the (patient/caregiver).  Consent for procedure obtained. Time Out: Verified patient identification, verified procedure, site/side was marked, verified correct patient position, special equipment/implants available, medications/allergies/relevent history reviewed, required imaging and test results available.    The medial and lateral bands of the left plantar fascia insertion onto the calcaneus was targeted for Extracorporeal shockwave therapy.    Power Level: 90 Frequency: 12 Impulse/cycles: 1500 throughout medial plantar fascia Head size: Medium  Due to her previous lateral foot discomfort, power level was reduced to 60 for lateral aspect of the foot, and additional 1500 impulses were applied in this area.  Patient did tolerate procedure well.  We will plan to follow-up in 1 week for her fourth session.    Given her previous improvement with physical therapy taping, she was given a handout that we will walk her through how to apply this taping herself.  We also discussed night socks as well as arch sleeves.  Continue to perform plantar fascial exercises and stretches daily.  Patient expressed understanding with plan.  She had no further  questions or concerns.  I observed and examined the patient with the [facility] resident and agree with assessment and plan.  Note reviewed and modified by me. Sterling Big, MD

## 2021-01-05 ENCOUNTER — Ambulatory Visit (INDEPENDENT_AMBULATORY_CARE_PROVIDER_SITE_OTHER): Payer: Self-pay | Admitting: Family Medicine

## 2021-01-05 ENCOUNTER — Other Ambulatory Visit: Payer: Self-pay

## 2021-01-05 ENCOUNTER — Ambulatory Visit
Admission: RE | Admit: 2021-01-05 | Discharge: 2021-01-05 | Disposition: A | Discharge status: Home / Self Care | Payer: Managed Care, Other (non HMO) | Source: Ambulatory Visit | Attending: Family Medicine | Admitting: Family Medicine

## 2021-01-05 DIAGNOSIS — M722 Plantar fascial fibromatosis: Secondary | ICD-10-CM

## 2021-01-05 IMAGING — DX DG FOOT COMPLETE 3+V*L*
3 series · 3 of 3 positions shown · non-contrast
Comparison: None.

CLINICAL DATA: Left foot pain

EXAM:
LEFT FOOT - COMPLETE 3+ VIEW

[dg foot complete left (1 of 3)]
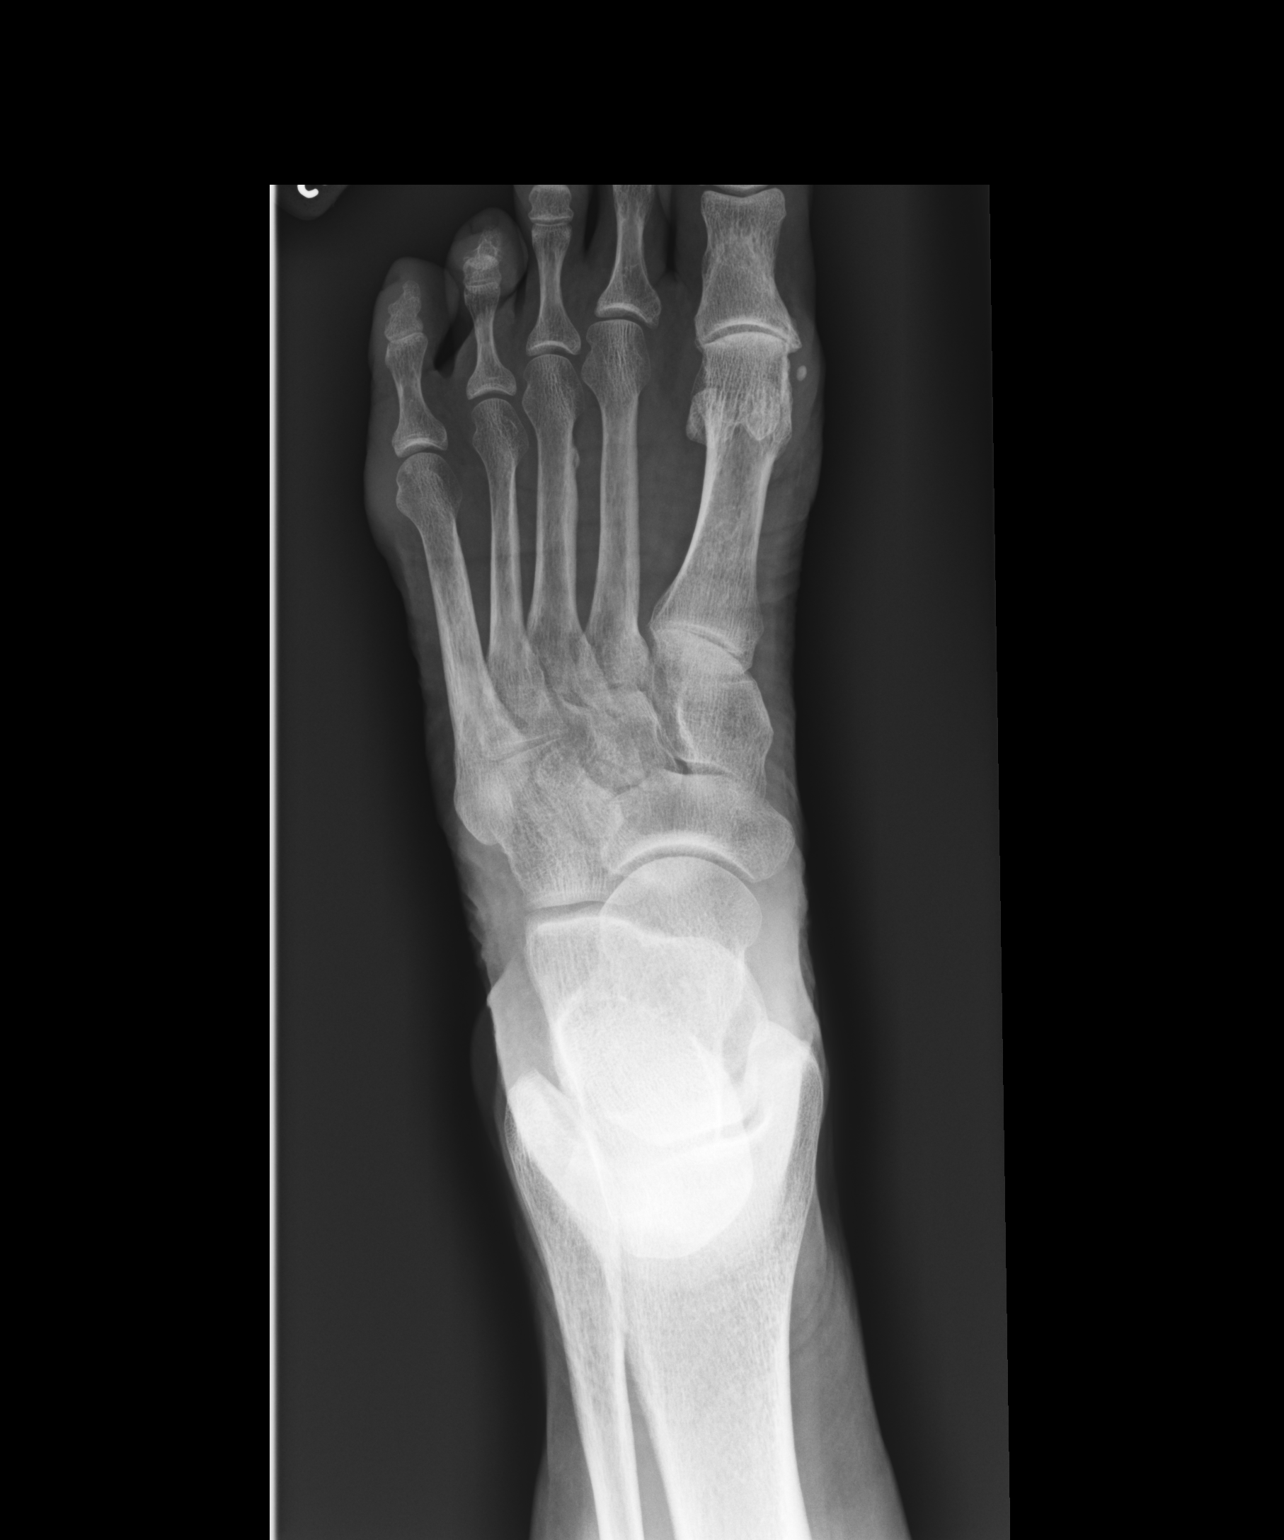

[dg foot complete left (2 of 3)]
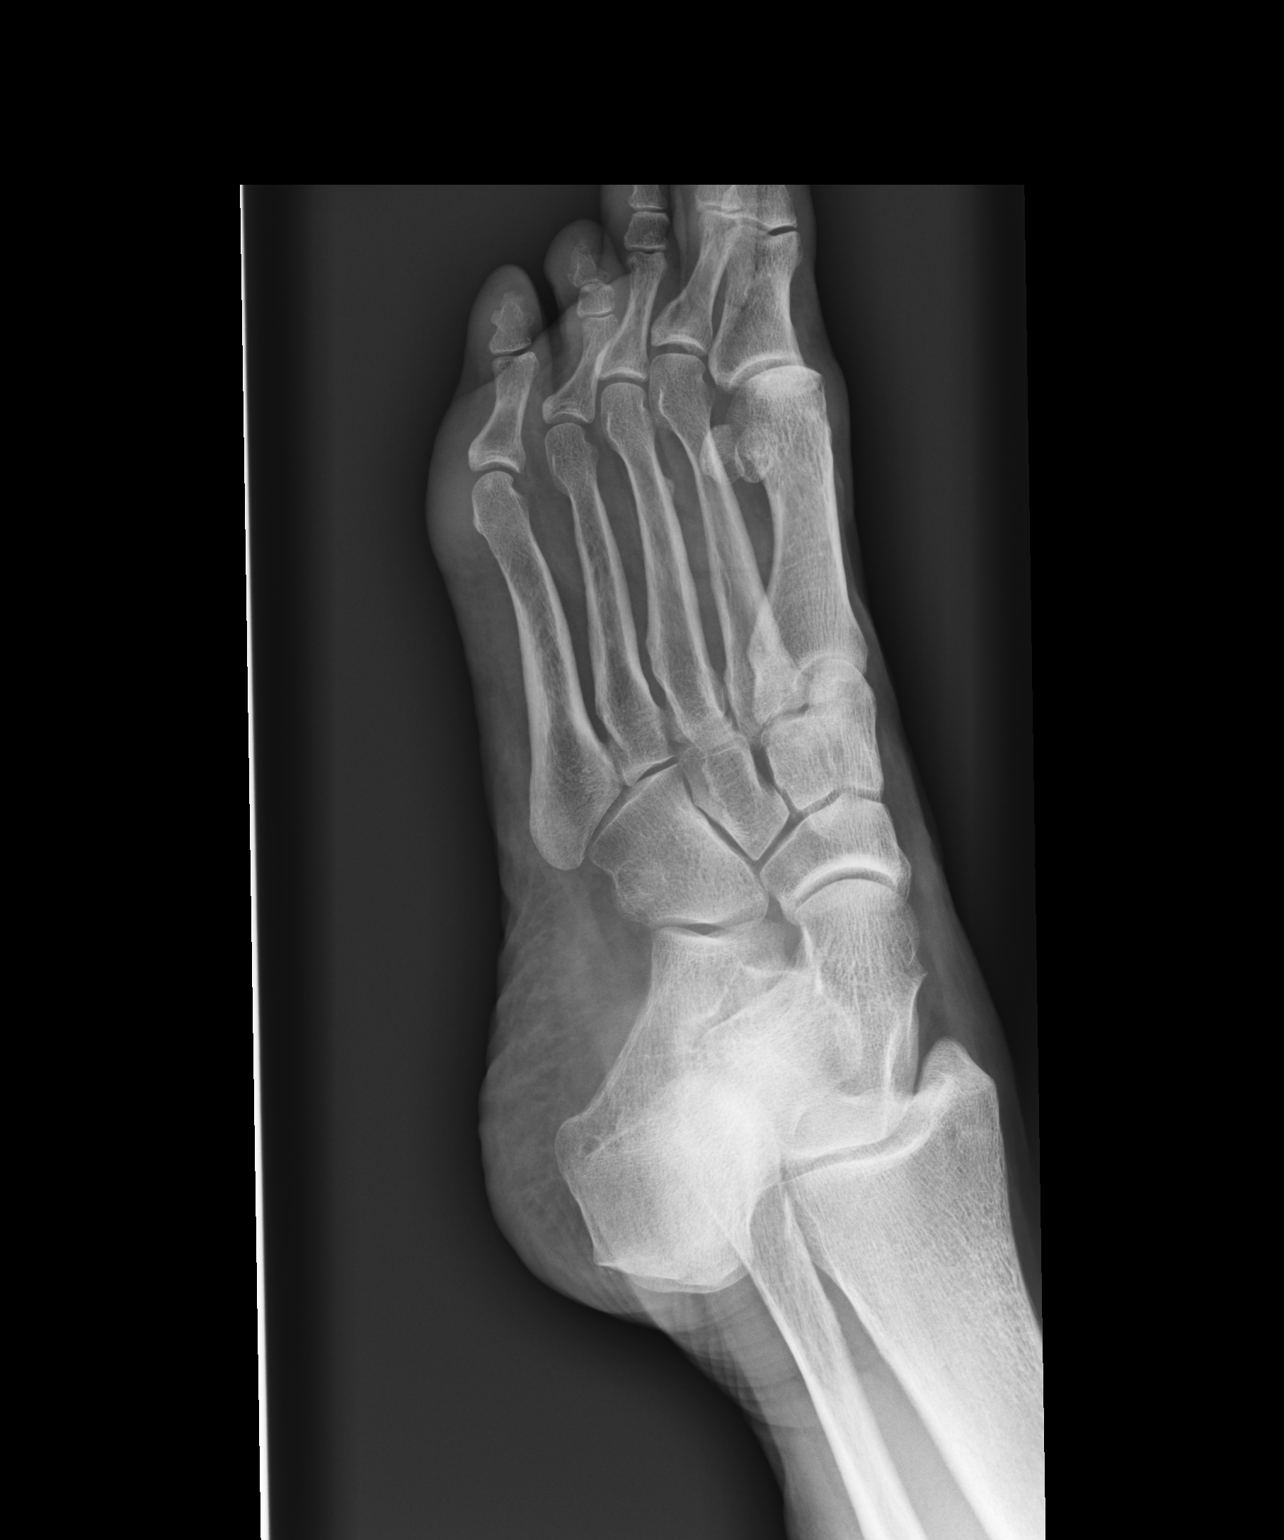

[dg foot complete left (3 of 3)]
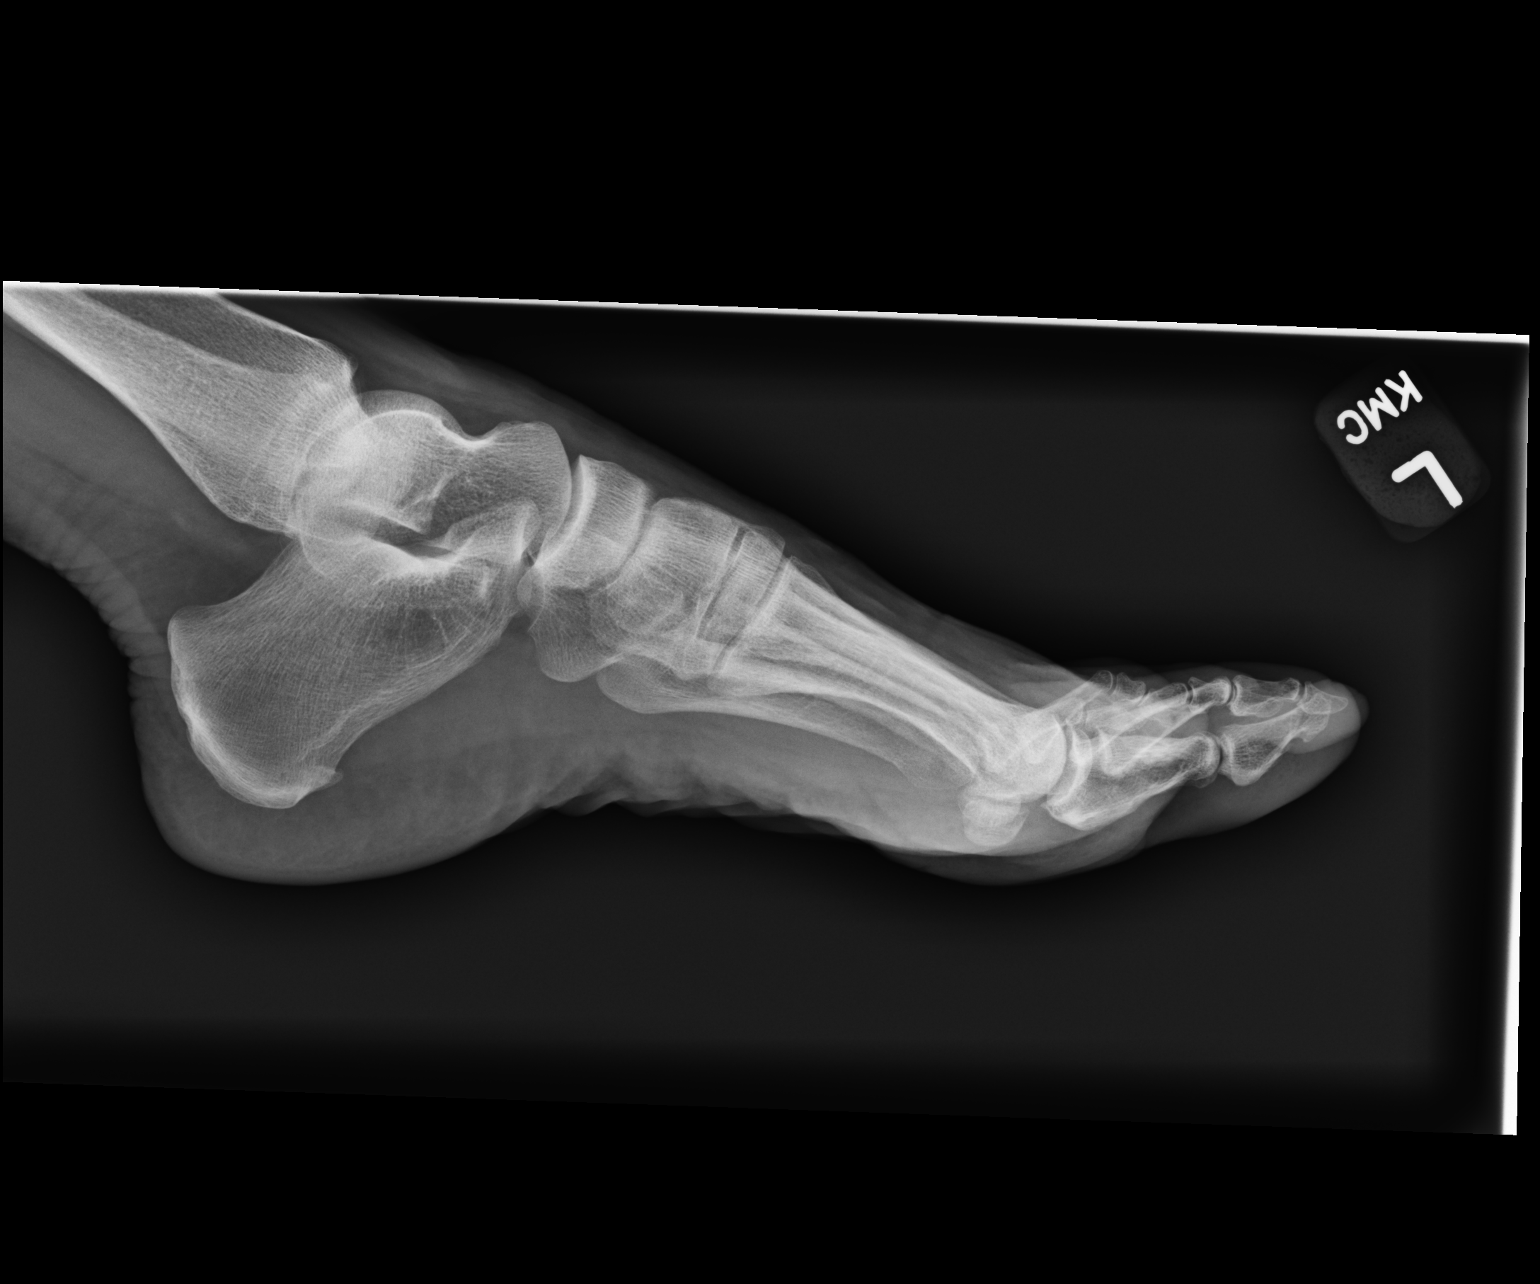

[3 of 3 positions shown; findings below may reference images not displayed]

FINDINGS: Three view radiograph left foot demonstrates normal alignment. No
fracture or dislocation. Mild degenerate arthritis of the first MTP
joint. Remaining joint spaces are preserved. Mild soft tissue
swelling is seen lateral to the left fifth MTP joint.
IMPRESSION: No acute fracture or dislocation.

Mild soft tissue swelling lateral to the left fifth MTP joint.

## 2021-01-05 NOTE — Progress Notes (Signed)
ECSWT Note [name] October 06, 1965   Tonita is a 55yo F who presents for her fourth ESWT treatment of her left plantar fascia.  She states that she tolerated the last treatment very well, and feels as though she is making improvement along the medial aspect. She says there are some days when she doesn't feel the pain on the medial part of the arch at all. The lateral heel pain, however, is much slower to improve.  Procedure: ECSWT Indications: Left chronic plantar fasciitis  Procedure Details Consent: Risks of procedure as well as the alternatives and risks of each were explained to the (patient/caregiver).  Consent for procedure obtained. Time Out: Verified patient identification, verified procedure, site/side was marked, verified correct patient position, special equipment/implants available, medications/allergies/relevent history reviewed, required imaging and test results available.    The medial and lateral bands of the left plantar fascia insertion onto the calcaneus was targeted for Extracorporeal shockwave therapy.   Treatment #: 4/4   Power Level: 90 Frequency: 12 Impulse/cycles: 1500 throughout medial plantar fascia Head size: Medium  Due to much more discomfort in the lateral foot, power level was reduced to 70 and additional 1500 impulses were applied to the lateral band of the plantar fascia.  Patient tolerated treatment well. As previously discussed at the initiation of her treatments, she is to follow up with Dr Evelina Dun in his clinic in approx 2 weeks to evaluate benefit of ESWT and continued symptoms. Given the persistence of her lateral foot pain, will order XR to supplement evaluation prior to her follow up appointment. Would consider repeated US of lateral foot/ankle if this area continues to demonstrate slower improvement.   Addendum:  I was the preceptor for this visit and available for immediate consultation.  Norton Blizzard MD Marrianne Mood

## 2021-02-15 ENCOUNTER — Other Ambulatory Visit: Payer: Self-pay | Admitting: Sports Medicine

## 2021-02-15 DIAGNOSIS — M545 Low back pain, unspecified: Secondary | ICD-10-CM

## 2021-02-15 DIAGNOSIS — R102 Pelvic and perineal pain: Secondary | ICD-10-CM

## 2021-02-25 ENCOUNTER — Ambulatory Visit
Admission: RE | Admit: 2021-02-25 | Discharge: 2021-02-25 | Disposition: A | Discharge status: Home / Self Care | Payer: Managed Care, Other (non HMO) | Source: Ambulatory Visit | Attending: Sports Medicine | Admitting: Sports Medicine

## 2021-02-25 ENCOUNTER — Other Ambulatory Visit: Payer: Self-pay

## 2021-02-25 DIAGNOSIS — R102 Pelvic and perineal pain: Secondary | ICD-10-CM

## 2021-02-25 DIAGNOSIS — M545 Low back pain, unspecified: Secondary | ICD-10-CM

## 2021-02-25 IMAGING — MR MR LUMBAR SPINE W/O CM
4 of 5 series · 26 of 48 positions shown · non-contrast
Comparison: None available.

CLINICAL DATA: Initial evaluation for low back pain with radiation
to the right side/hip to SI joint, weakness in bilateral legs,
numbness in bilateral hands and feet for approximately 2 months.

EXAM:
MRI LUMBAR SPINE WITHOUT CONTRAST
TECHNIQUE: Multiplanar, multisequence MR imaging of the lumbar spine was
performed. No intravenous contrast was administered.

[Series 2: T2 · sagittal · 4.0mm · 1.09mm/px · 5 of 16 slices shown (1 of 2)]
[im 1/16]
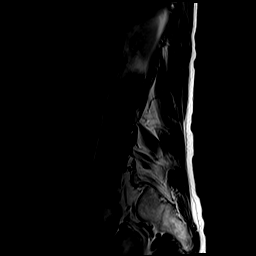
[im 4/16]
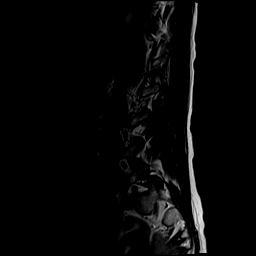
[im 8/16]
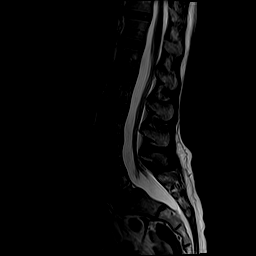
[im 12/16]
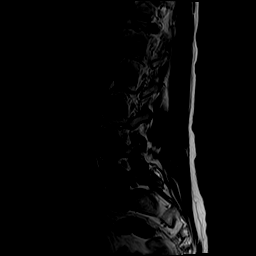
[im 16/16]
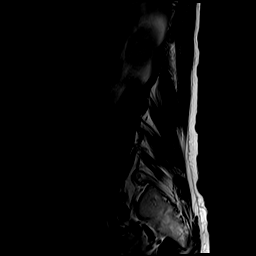

[Series 4: T1 · sagittal · 4.0mm · 1.09mm/px · 6 of 16 slices shown (1 of 2)]
[im 1/16]
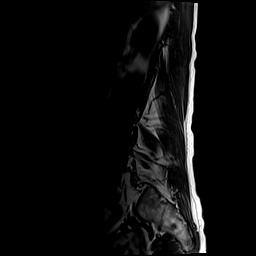
[im 4/16]
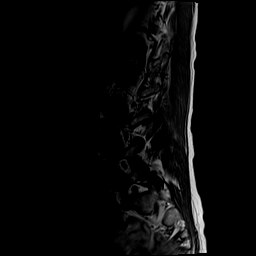
[im 7/16]
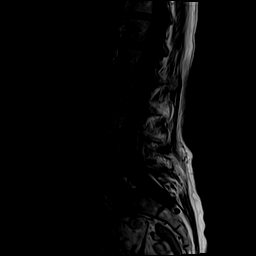
[im 10/16]
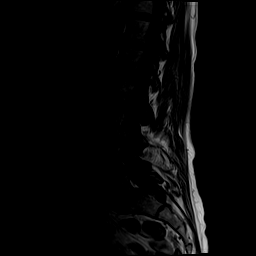
[im 13/16]
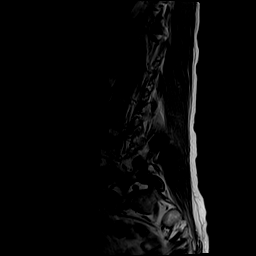
[im 16/16]
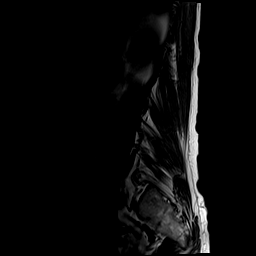

[Series 5: T2 · axial · 4.0mm · 0.39mm/px · z∈[-58,+157]mm · 10 of 45 slices shown (2 of 2)]
[im 3/45]
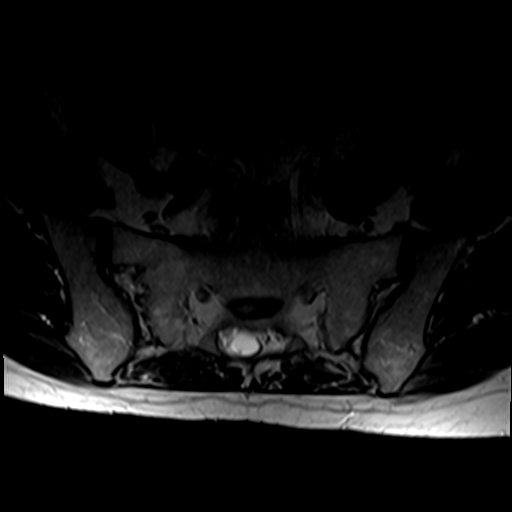
[im 6/45]
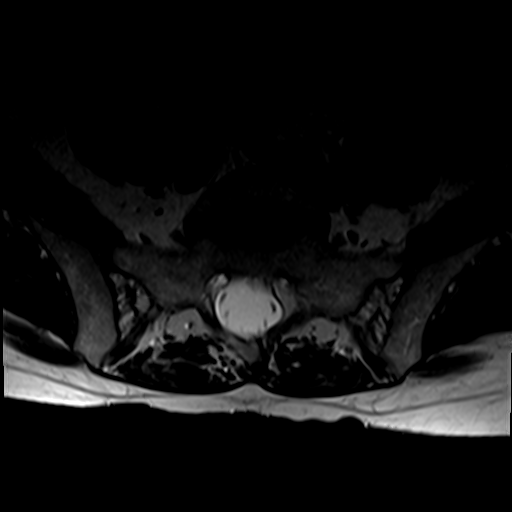
[im 9/45]
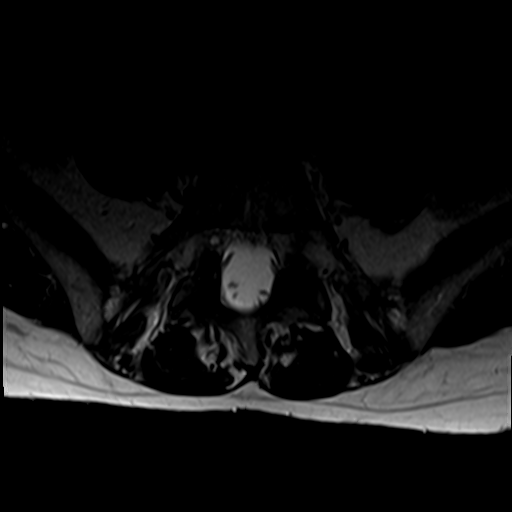
[im 15/45]
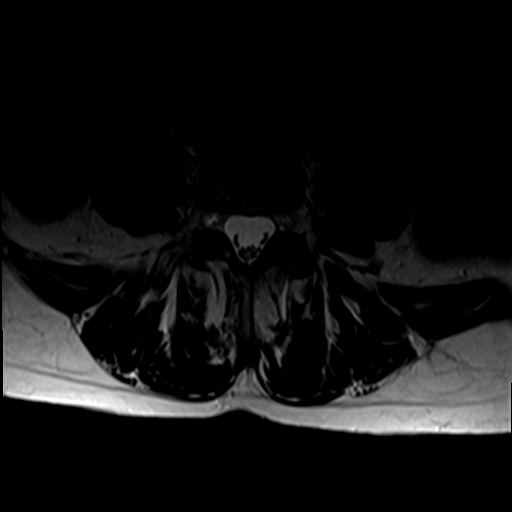
[im 21/45]
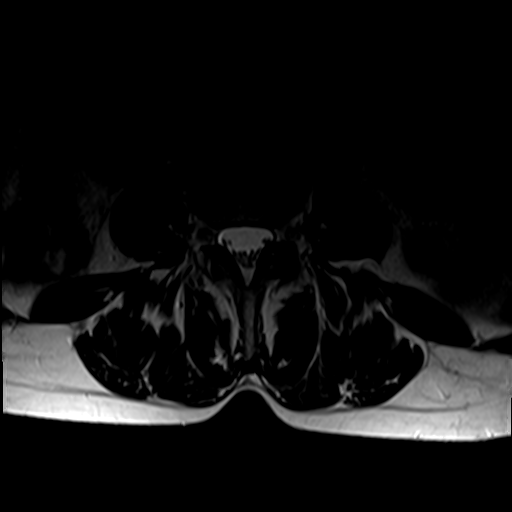
[im 24/45]
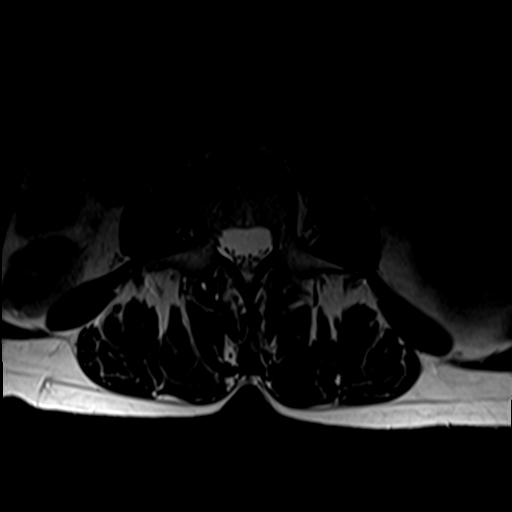
[im 27/45]
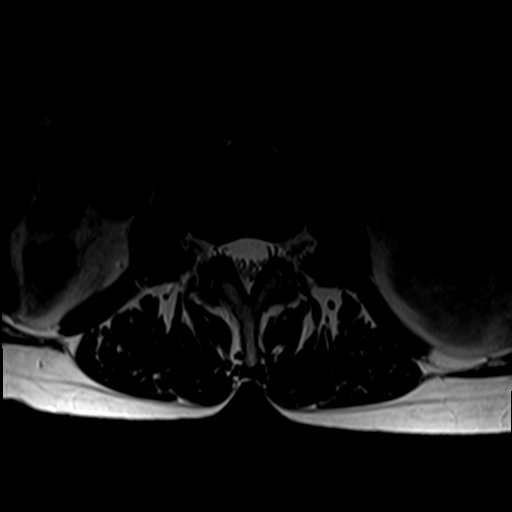
[im 33/45]
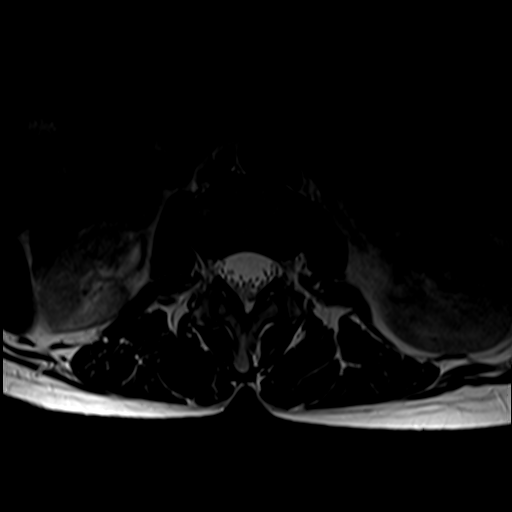
[im 39/45]
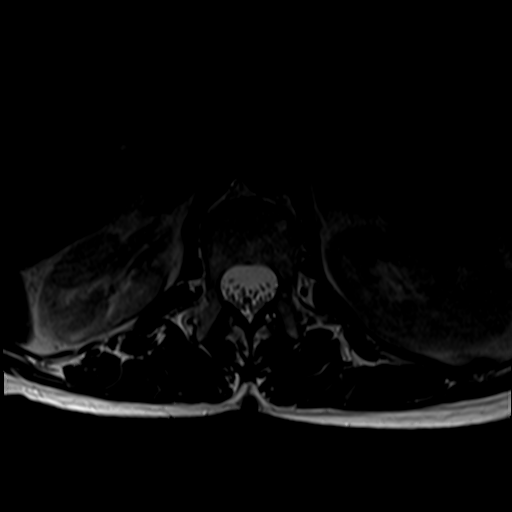
[im 45/45]
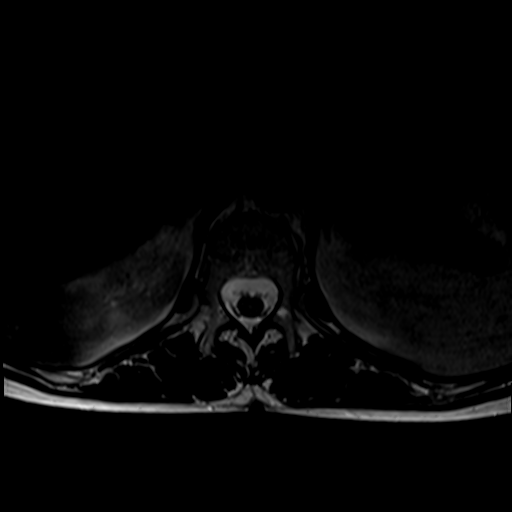

[Series 6: T1 · axial · 4.0mm · 0.39mm/px · z∈[-58,+129]mm · 5 of 45 slices shown (2 of 2)]
[im 3/45]
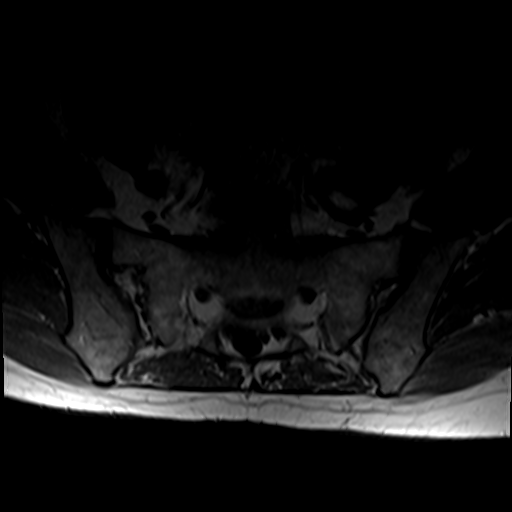
[im 6/45]
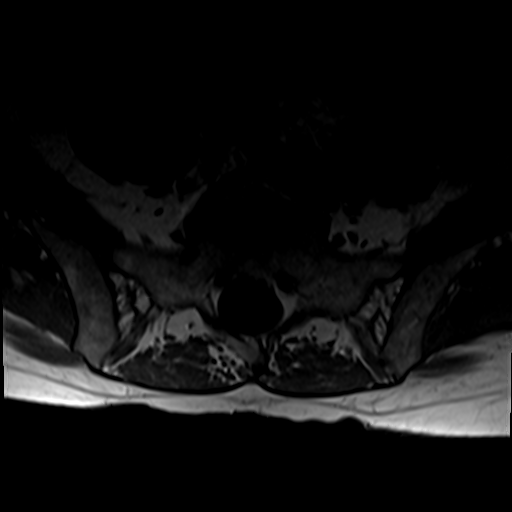
[im 9/45]
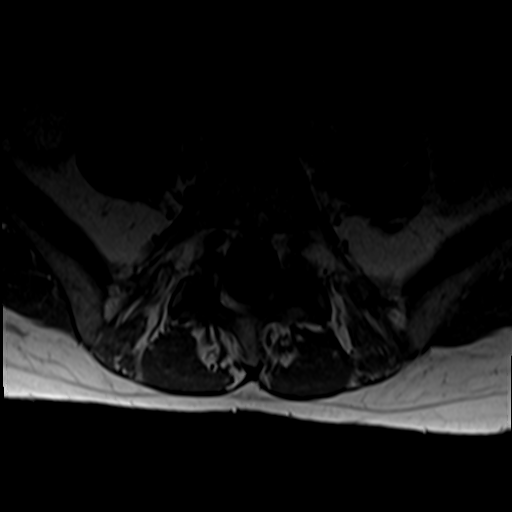
[im 24/45]
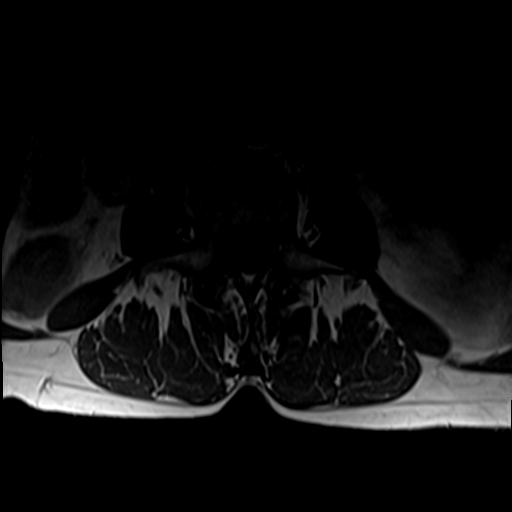
[im 39/45]
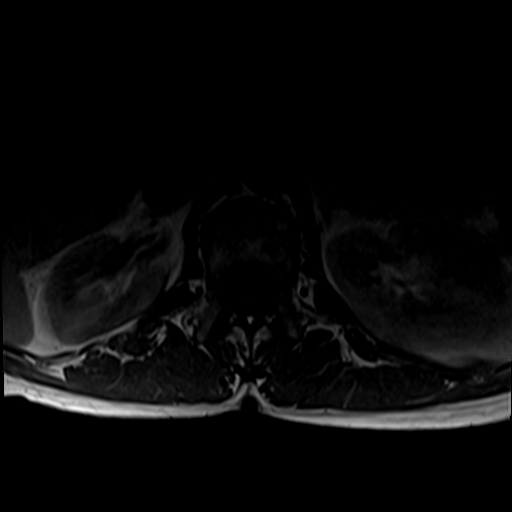

[26 of 48 positions shown; findings below may reference images not displayed]

FINDINGS: Segmentation: Standard. Lowest well-formed disc space labeled the
L5-S1 level.

Alignment: Chronic bilateral pars defects at L5 with associated 5 mm
spondylolisthesis. Alignment otherwise normal with preservation of
the normal lumbar lordosis.

Vertebrae: Vertebral body height maintained without acute or chronic
fracture. Bone marrow signal intensity within normal limits. Few
scattered benign hemangiomata noted. No worrisome osseous lesions.
No abnormal marrow edema.

Conus medullaris and cauda equina: Conus extends to the L1 level.
Conus and cauda equina appear normal.

Paraspinal and other soft tissues: Paraspinous soft tissues within
normal limits. Asymmetric right renal atrophy noted. 6 mm simple
cyst present at the upper pole right kidney. Visualized visceral
structures otherwise unremarkable.

Disc levels:

L1-2:  Unremarkable.

L2-3: Normal interspace. Mild facet hypertrophy. No canal or
foraminal stenosis.

L3-4: Disc desiccation with mild annular disc bulge. Mild bilateral
facet hypertrophy. No spinal stenosis. Foramina remain patent.

L4-5: Normal interspace. Moderate bilateral facet hypertrophy. No
spinal stenosis. Foramina remain patent.

L5-S1: Chronic bilateral pars defects with associated 5 mm
spondylolisthesis. Disc desiccation with associated broad posterior
pseudo disc bulge/uncovering. Mild facet hypertrophy. No canal
stenosis. Foramina remain patent.
IMPRESSION: 1. Chronic bilateral pars defects at L5 with associated 5 mm
spondylolisthesis.
2. Mild degenerative disc disease at L3-4 without significant
stenosis or impingement.
3. Mild to moderate multilevel facet hypertrophy, most notable at
L4-5.
4. Asymmetric right renal atrophy.

## 2021-02-25 IMAGING — MR MR PELVIS W/O CM
4 of 5 series · 31 of 48 positions shown · non-contrast
Comparison: Pelvis x-rays dated [DATE].

CLINICAL DATA: Low back pain radiating to the right buttock and hip
for the past 2 months. Bilateral leg weakness.

EXAM:
MRI PELVIS WITHOUT CONTRAST
TECHNIQUE: Multiplanar multisequence MR imaging of the pelvis was performed. No
intravenous contrast was administered.

[Series 3: T2 fat-sat · sagittal · 4.0mm · 0.53mm/px · 9 of 66 slices shown]
[im 1/66]
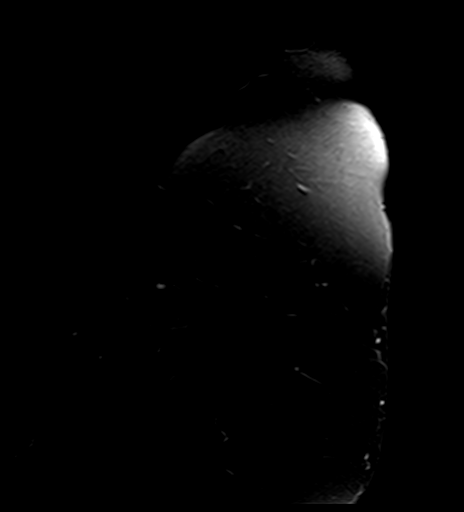
[im 12/66]
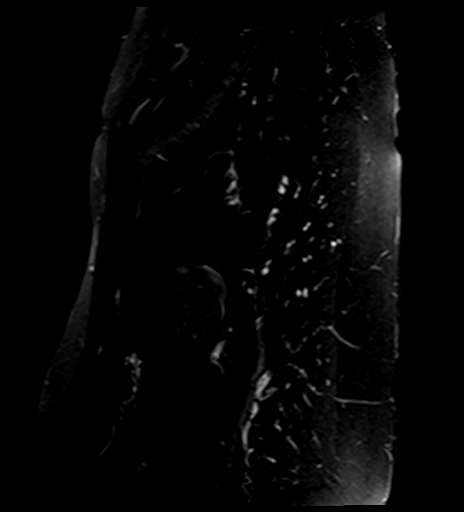
[im 18/66]
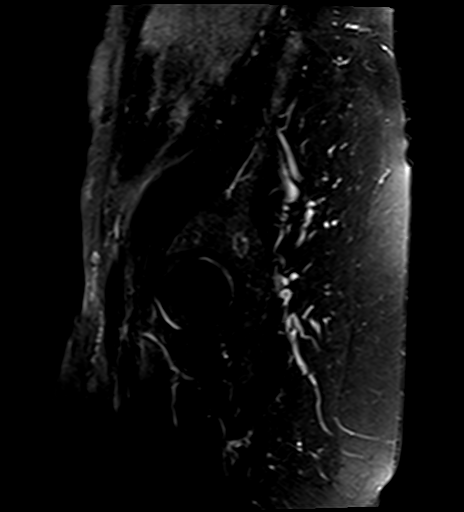
[im 30/66]
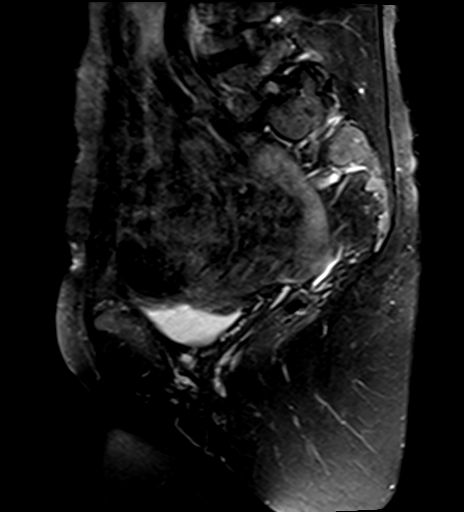
[im 36/66]
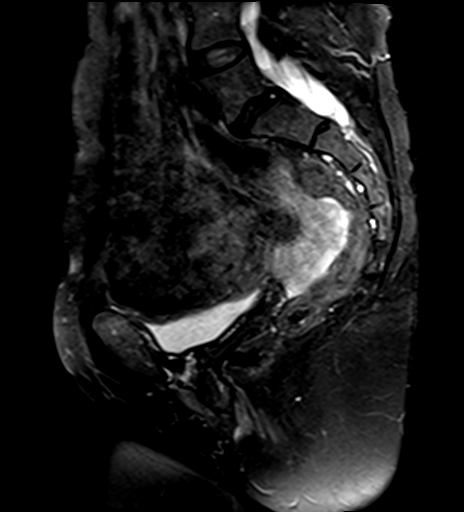
[im 48/66]
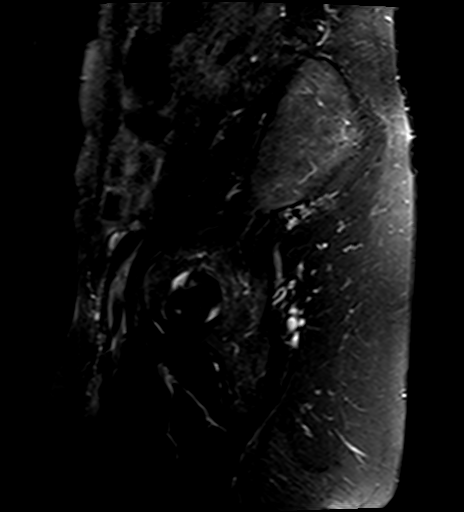
[im 54/66]
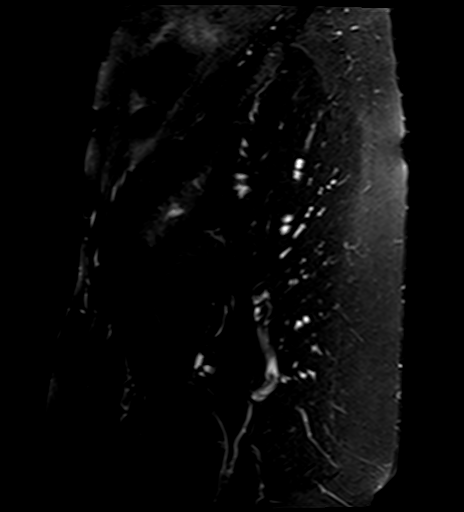
[im 60/66]
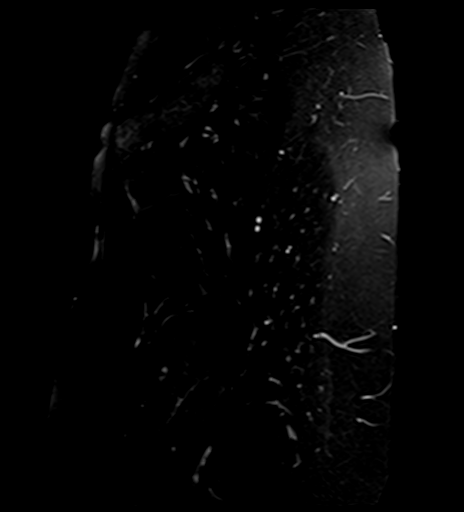
[im 66/66]
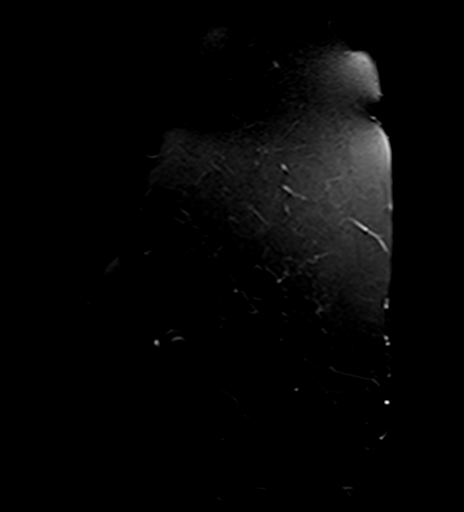

[Series 4: T1 · coronal · 4.0mm · 1.41mm/px · 8 of 39 slices shown (1 of 2)]
[im 1/39]
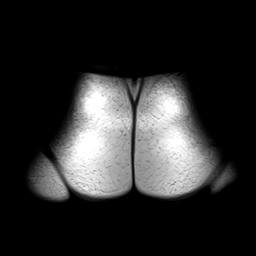
[im 6/39]
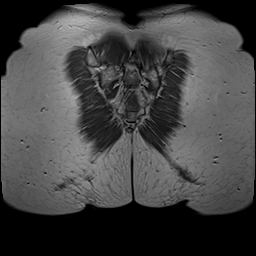
[im 11/39]
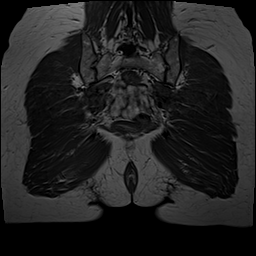
[im 17/39]
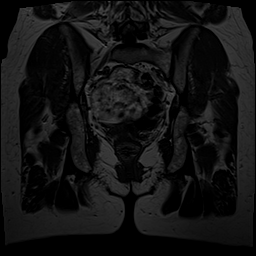
[im 22/39]
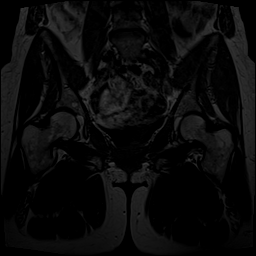
[im 28/39]
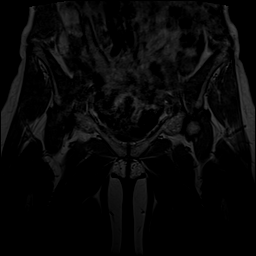
[im 33/39]
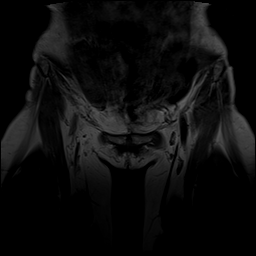
[im 39/39]
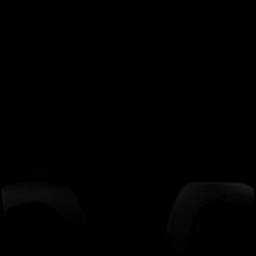

[Series 5: STIR · coronal · 4.0mm · 1.41mm/px · 8 of 39 slices shown]
[im 1/39]
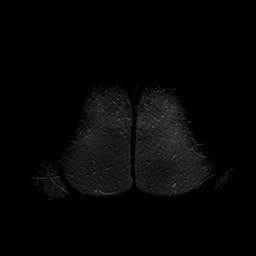
[im 6/39]
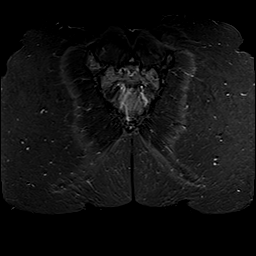
[im 11/39]
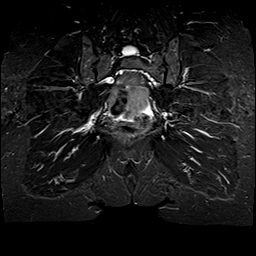
[im 17/39]
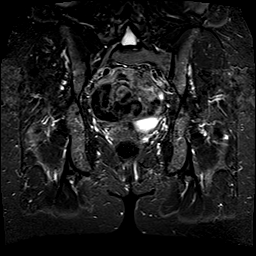
[im 22/39]
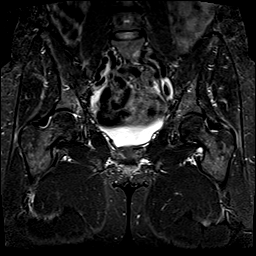
[im 28/39]
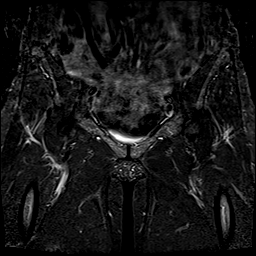
[im 33/39]
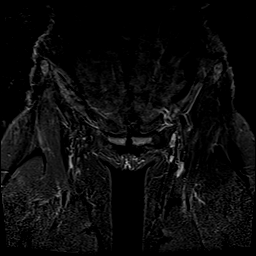
[im 39/39]
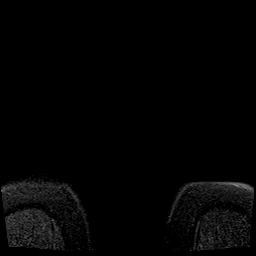

[Series 6: T1 · axial · 4.0mm · 0.70mm/px · z∈[-174,+36]mm · 6 of 49 slices shown (2 of 2)]
[im 1/49]
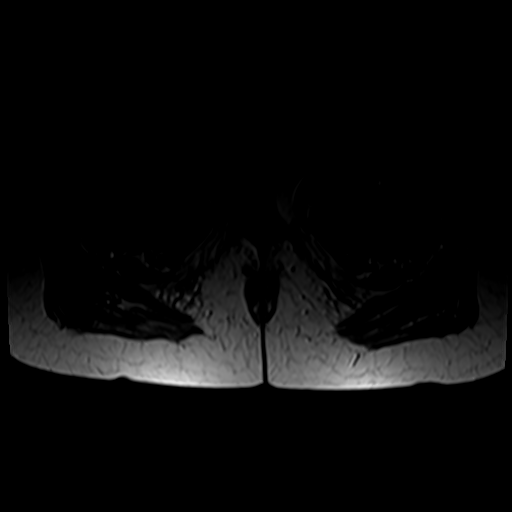
[im 6/49]
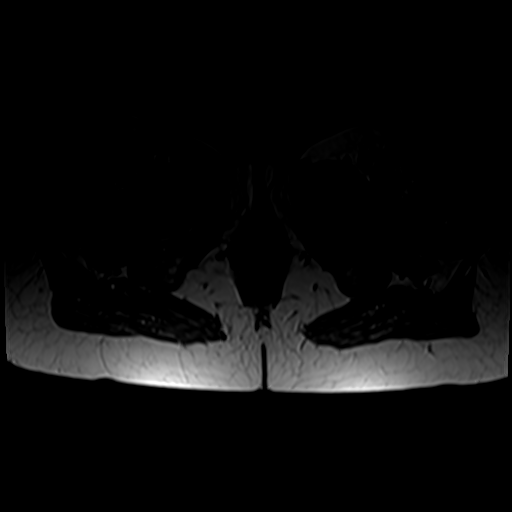
[im 17/49]
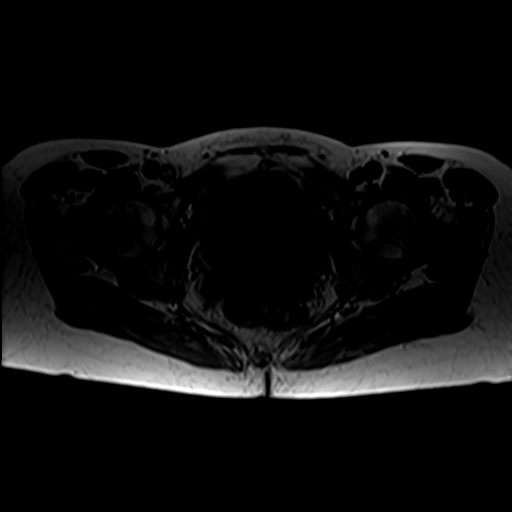
[im 22/49]
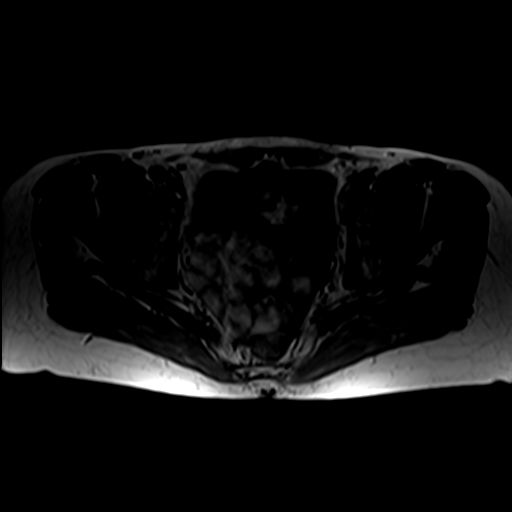
[im 27/49]
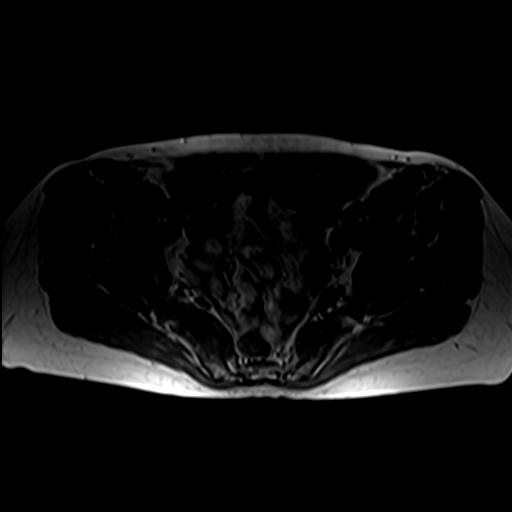
[im 43/49]
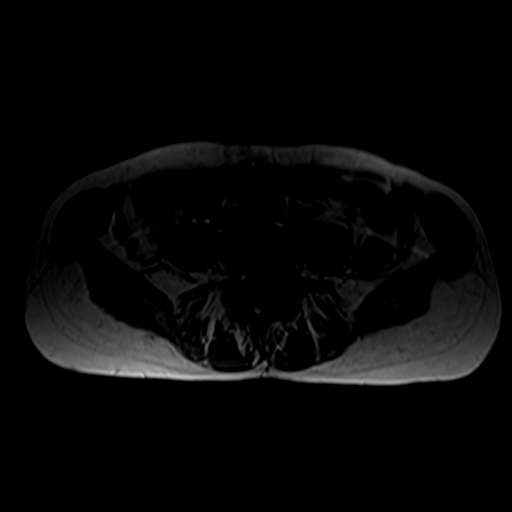

[31 of 48 positions shown; findings below may reference images not displayed]

FINDINGS: Bones: There is no evidence of acute fracture, dislocation or
avascular necrosis. Diffuse marrow heterogeneity. 7 mm T2
hyperintense lesion in the right sacral ala, nonspecific but
non-aggressive in appearance the the. The visualized sacroiliac
joints and symphysis pubis appear normal. Please see separate MRI
lumbar spine report from same day for lower lumbar findings

Articular cartilage and labrum

Articular cartilage: No focal chondral defect or subchondral signal
abnormality identified.

Labrum: Small left anterior superior labral tear (series 3, image
51). The right labrum is grossly intact. No paralabral abnormality.

Joint or bursal effusion

Joint effusion: No significant hip joint effusion.

Bursae: No focal periarticular fluid collection.

Muscles and tendons

Muscles and tendons: The visualized gluteus, hamstring and iliopsoas
tendons appear normal. No muscle edema or atrophy.

Other findings

Miscellaneous: The visualized internal pelvic contents appear
unremarkable. Prior hysterectomy. Trace free fluid in the pelvis,
nonspecific.
IMPRESSION: 1.  No acute abnormality or significant degenerative changes.
2. Small left anterior superior labral tear.

## 2021-03-16 ENCOUNTER — Other Ambulatory Visit: Payer: Self-pay

## 2021-03-16 ENCOUNTER — Ambulatory Visit: Payer: Managed Care, Other (non HMO) | Admitting: Sports Medicine

## 2021-03-16 DIAGNOSIS — M4317 Spondylolisthesis, lumbosacral region: Secondary | ICD-10-CM | POA: Insufficient documentation

## 2021-03-16 MED ORDER — GABAPENTIN 300 MG PO CAPS: 300.0000 mg | ORAL_CAPSULE | Freq: Every day | ORAL | 0 refills | Status: DC

## 2021-03-16 NOTE — Progress Notes (Signed)
   Subjective:    Patient ID: [name], female    DOB: 03-30-1966, 55 y.o.   MRN: [id number]  HPI 55 year old female here for follow-up for chronic left foot pain.  Foot pain started over 2 years ago and is worse at the end of the day.  Pain is located anterior to her left heel and lateral left heel.  She has tried multiple treatments for plantar fasciitis and states that she has not had any significant relief.  She has been fitted for orthotics, has gone through physical therapy, and has undergone 4 ESWT treatments.  She received a steroid injection through her podiatrist which only provided relief for a few days.  She did not want to try a second steroid injection.  She has pain when wearing tennis shoes with the orthotics but reports the least amount of pain when wearing wedges.  She continues to do stretches and exercises daily and typically rolls her foot in the morning and at the end of the day.  She has had plantar fasciitis a few times many years ago in her right foot.  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  No significant tenderness to palpation.  Full strength with dorsiflexion and plantar flexion.  Neurovascularly intact.   POC ultrasound left foot: Tibial nerve at the level of the medial malleolus surface area measures 0.06 cm^2 Plantar fascia on left is 0.53 and hypoechoicc On RT this measures 0.33  Ultrasound and interpretation by Dr. and Mckinley Jewel. Fields, MD      Assessment & Plan:   Chronic left foot pain She has undergone multiple conservative treatments for plantar fasciitis without significant relief.  Perhaps she has atypical plantar fasciitis or possible Baxters nerve entrapment.  She does not have  her orthotic today but she will bring back her orthotic to see if it can be adjusted.  We will trial gabapentin for 1 month to see if this will help with possible neuropathic component of pain.  She will be receiving steroid injections of her back next week with orthopedics, so advised patient weight a few weeks before starting the gabapentin.  She will follow-up after trying the gabapentin to see if any improvement.  Sibyl Parr, MD   I observed and examined the patient with the resident and agree with assessment and plan.  Note reviewed and modified by me. Littie Deeds, MD

## 2021-03-21 ENCOUNTER — Ambulatory Visit: Payer: Managed Care, Other (non HMO) | Admitting: Sports Medicine

## 2021-04-19 ENCOUNTER — Encounter: Payer: Self-pay | Admitting: Obstetrics and Gynecology

## 2021-04-19 ENCOUNTER — Ambulatory Visit: Payer: Managed Care, Other (non HMO) | Admitting: Obstetrics and Gynecology

## 2021-04-19 ENCOUNTER — Other Ambulatory Visit: Payer: Self-pay

## 2021-04-19 VITALS — BP 123/78 | HR 67 | Ht 66.0 in | Wt 140.0 lb

## 2021-04-19 DIAGNOSIS — N362 Urethral caruncle: Secondary | ICD-10-CM | POA: Diagnosis not present

## 2021-04-19 DIAGNOSIS — N952 Postmenopausal atrophic vaginitis: Secondary | ICD-10-CM | POA: Diagnosis not present

## 2021-04-19 DIAGNOSIS — R350 Frequency of micturition: Secondary | ICD-10-CM | POA: Diagnosis not present

## 2021-04-19 DIAGNOSIS — R319 Hematuria, unspecified: Secondary | ICD-10-CM | POA: Diagnosis not present

## 2021-04-19 DIAGNOSIS — N393 Stress incontinence (female) (male): Secondary | ICD-10-CM

## 2021-04-19 DIAGNOSIS — M62838 Other muscle spasm: Secondary | ICD-10-CM

## 2021-04-19 LAB — POCT URINALYSIS DIPSTICK
Appearance: ABNORMAL
Bilirubin, UA: NEGATIVE
Glucose, UA: NEGATIVE
Ketones, UA: NEGATIVE
Leukocytes, UA: NEGATIVE
Nitrite, UA: NEGATIVE
Protein, UA: NEGATIVE
Spec Grav, UA: <=1.005 — AB (ref 1.010–1.025)
Urobilinogen, UA: 0.2 E.U./dL
pH, UA: 6.5 (ref 5.0–8.0)

## 2021-04-19 MED ORDER — ESTRADIOL 0.1 MG/GM VA CREA: 0.5000 g | TOPICAL_CREAM | VAGINAL | 11 refills | Status: DC

## 2021-04-19 NOTE — Patient Instructions (Signed)
Start vaginal estrogen therapy nightly for two weeks then 2 times weekly at night for treatment of vaginal atrophy (dryness of the vaginal tissues).  Please let us know if the prescription is too expensive and we can look for alternative options.   

## 2021-04-19 NOTE — Progress Notes (Signed)
[facility] New Patient Evaluation and Consultation  Referring Provider: Maurice Small, MD PCP: Maurice Small, MD Date of Service: 04/19/2021  SUBJECTIVE Chief Complaint: incontinence and discomfort with intercourse  History of Present Illness: [name] is a 55 y.o. White or Caucasian female presenting for evaluation of incontinence.    Review of records significant for: 2018: S/P Abdominal sacrocolpopexy, bilateral salpingectomy, Burch, paravaginal repair, and cystoscopy 2006: S/P TVH, Left paravaginal repair, anterior and posterior repair, and cystoscopy  Urinary Symptoms: Leaks urine with cough/ sneeze, laughing, exercise, with a full bladder, and sometimes with walking Leaks 1-3 time(s) per day  Pad use: 1-2 liners/ mini-pads per day.   She is bothered by her UI symptoms. Urethral also gets "aggravated". Not using estrogen cream consistently.   Day time voids 5-6.  Nocturia: 2-3 times per night to void. Voiding dysfunction: she empties her bladder well.  does not use a catheter to empty bladder.  When urinating, she feels she has no difficulties   UTIs:  0  UTI's in the last year.   Denies history of blood in urine and kidney or bladder stones  Pelvic Organ Prolapse Symptoms:                  She Denies a feeling of a bulge the vaginal area.   Bowel Symptom: Bowel movements: 1 time(s) per day Stool consistency: soft  Straining: no.  Splinting: no.  Incomplete evacuation: no.  She Denies accidental bowel leakage / fecal incontinence Bowel regimen: none  Sexual Function Sexually active: yes.  Sexual orientation:  heterosexual Pain with sex: Yes, at the vaginal opening, deep in the pelvis, has discomfort due to dryness  Pelvic Pain Denies pelvic pain    Past Medical History:  Past Medical History:  Diagnosis Date   Allergy    Breast mass, right    Complication of anesthesia    hard to wake up   GERD (gastroesophageal reflux  disease)    Seizures (HCC)    as teen, no meds since age 69, no seizures     Past Surgical History:   Past Surgical History:  Procedure Laterality Date   ABDOMINAL HYSTERECTOMY  2006   AP repair also   ABDOMINAL SACROCOLPOPEXY     with paravaginal repair   BREAST BIOPSY Right 08/31/2015   BREAST EXCISIONAL BIOPSY Right 09/23/2015   BREAST LUMPECTOMY WITH RADIOACTIVE SEED LOCALIZATION Right 09/23/2015   Procedure: BREAST LUMPECTOMY WITH RADIOACTIVE SEED LOCALIZATION;  Surgeon: Chevis Pretty III, MD;  Location: [facility];  Service: General;  Laterality: Right;   BUNIONECTOMY Bilateral 1986   EYE SURGERY     lasik   ROTATOR CUFF REPAIR Right 2012   URETER SURGERY     as child     Past OB/GYN History: OB History  Gravida Para Term Preterm AB Living  1 1 1     1   SAB IAB Ectopic Multiple Live Births          1    # Outcome Date GA Lbr Len/2nd Weight Sex Delivery Anes PTL Lv  1 Term            S/p hysterectomy, abdominal sacrocolpopexy   Medications: She has a current medication list which includes the following prescription(s): acetaminophen, cyclosporine, [START ON 04/20/2021] estradiol, flaxseed (linseed), ibuprofen, methylprednisolone, multiple vitamins-minerals, multiple vitamins-minerals, multivitamin with minerals, NON FORMULARY, omeprazole, oxycodone-acetaminophen, tretinoin, triamcinolone, and gabapentin.   Allergies: Patient is allergic to bee venom, keflex [cephalexin], sulfa  antibiotics, and latex.   Social History:  Social History   Tobacco Use   Smoking status: Never  Vaping Use   Vaping Use: Never used  Substance Use Topics   Alcohol use: Yes    Alcohol/week: 0.0 standard drinks    Comment: social   Drug use: No    Relationship status: married She lives with spouse and daughter.   She is employed as a Medical laboratory scientific officer. Regular exercise: Yes: bike, walking weights, running History of abuse: No  Family History:   Family History  Problem  Relation Age of Onset   Hyperlipidemia Mother    Hypertension Mother    Hypertension Maternal Grandfather    Hyperlipidemia Maternal Grandfather    Hyperlipidemia Paternal Grandmother    Hypertension Paternal Grandmother    Stroke Paternal Grandmother    Hyperlipidemia Paternal Grandfather    Hypertension Paternal Grandfather    Breast cancer Neg Hx      Review of Systems: Review of Systems  Constitutional:  Negative for fever, malaise/fatigue and weight loss.  Respiratory:  Negative for cough, shortness of breath and wheezing.   Cardiovascular:  Negative for chest pain, palpitations and leg swelling.  Gastrointestinal:  Negative for abdominal pain and blood in stool.  Genitourinary:  Negative for dysuria.  Musculoskeletal:  Positive for myalgias.  Skin:  Negative for rash.  Neurological:  Positive for headaches. Negative for dizziness.  Endo/Heme/Allergies:  Does not bruise/bleed easily.       + hot flashes  Psychiatric/Behavioral:  Negative for depression. The patient is not nervous/anxious.     OBJECTIVE Physical Exam: Vitals:   04/19/21 1630  BP: 123/78  Pulse: 67  Weight: 140 lb (63.5 kg)  Height: 5\' 6"  (1.676 m)    Physical Exam   GU / Detailed Urogynecologic Evaluation:  Pelvic Exam: Normal external female genitalia; Bartholin's and Skene's glands normal in appearance; urethral meatus with caruncle, no urethral masses or discharge.   CST: negative  Speculum exam reveals normal vaginal mucosa with atrophy. Normal vaginal cuff.    Adnexa no mass, fullness, tenderness.   On bimanual, scar tissue noted on right posterior vagina  Pelvic floor strength II/V  Pelvic floor musculature: Right levator tender, Right obturator tender, Left levator non-tender, Left obturator non-tender  POP-Q:   POP-Q  -2.5                                            Aa   -2.5                                           Ba  -9                                              C   3                                             Gh  4  Pb  9                                            tvl   -2                                            Ap  -2                                            Bp                                                 D     Rectal Exam:  Normal external rectum  Post-Void Residual (PVR) by Bladder Scan: In order to evaluate bladder emptying, we discussed obtaining a postvoid residual and she agreed to this procedure.  Procedure: The ultrasound unit was placed on the patient's abdomen in the suprapubic region after the patient had voided. A PVR of 12 ml was obtained by bladder scan.  Laboratory Results: POC urine: small blood   ASSESSMENT AND PLAN [name] is a 55 y.o. with:  1. SUI (stress urinary incontinence, female)   2. Hematuria, unspecified type   3. Urethral caruncle   4. Vaginal atrophy   5. Levator spasm   6. Urinary frequency     SUI -For treatment of stress urinary incontinence,  non-surgical options include expectant management, weight loss, physical therapy, as well as a pessary.  Surgical options include a midurethral sling, Burch urethropexy, and transurethral injection of a bulking agent. - She is interested in physical therapy, referral placed. Also given information on surgical options.   2. Hematuria - UA micro and culture sent to r/o hematuria  3. Caruncle/ atrophy - prescribes estrace crea. Use 0.5g nightly for two weeks then twice a week after  4. Levator spasm/ dyspareunia - The origin of pelvic floor muscle spasm can be multifactorial, including primary, reactive to a different pain source, trauma, or even part of a centralized pain syndrome. - Will start with physical therapy     Marguerita Beards, MD   Time spent: I spent 50 minutes dedicated to the care of this patient on the date of this encounter to include pre-visit review of records, face-to-face time  with the patient and post visit documentation and ordering medication/ testing.

## 2021-04-20 LAB — URINALYSIS, MICROSCOPIC ONLY
Bacteria, UA: NONE SEEN
Casts: NONE SEEN /lpf
Epithelial Cells (non renal): NONE SEEN /hpf (ref 0–10)
WBC, UA: NONE SEEN /hpf (ref 0–5)

## 2021-04-21 LAB — URINE CULTURE

## 2021-06-21 ENCOUNTER — Ambulatory Visit: Payer: Managed Care, Other (non HMO) | Admitting: Obstetrics and Gynecology

## 2021-07-05 ENCOUNTER — Ambulatory Visit: Payer: Managed Care, Other (non HMO) | Admitting: Obstetrics and Gynecology

## 2021-07-14 ENCOUNTER — Other Ambulatory Visit: Payer: Self-pay | Admitting: Family Medicine

## 2021-07-14 DIAGNOSIS — Z1231 Encounter for screening mammogram for malignant neoplasm of breast: Secondary | ICD-10-CM

## 2021-08-23 ENCOUNTER — Ambulatory Visit: Payer: Managed Care, Other (non HMO) | Admitting: Obstetrics and Gynecology

## 2021-08-29 ENCOUNTER — Ambulatory Visit
Admission: RE | Admit: 2021-08-29 | Discharge: 2021-08-29 | Disposition: A | Discharge status: Home / Self Care | Payer: Managed Care, Other (non HMO) | Source: Ambulatory Visit

## 2021-08-29 DIAGNOSIS — Z1231 Encounter for screening mammogram for malignant neoplasm of breast: Secondary | ICD-10-CM

## 2021-08-29 IMAGING — MG MM DIGITAL SCREENING BILAT W/ TOMO AND CAD
8 series · 9 of 24 positions shown · non-contrast
Comparison: Previous exam(s).

CLINICAL DATA: Screening.

EXAM:
DIGITAL SCREENING BILATERAL MAMMOGRAM WITH TOMOSYNTHESIS AND CAD
TECHNIQUE: Bilateral screening digital craniocaudal and mediolateral oblique
mammograms were obtained. Bilateral screening digital breast
tomosynthesis was performed. The images were evaluated with
computer-aided detection.

[L CC synth-2D]
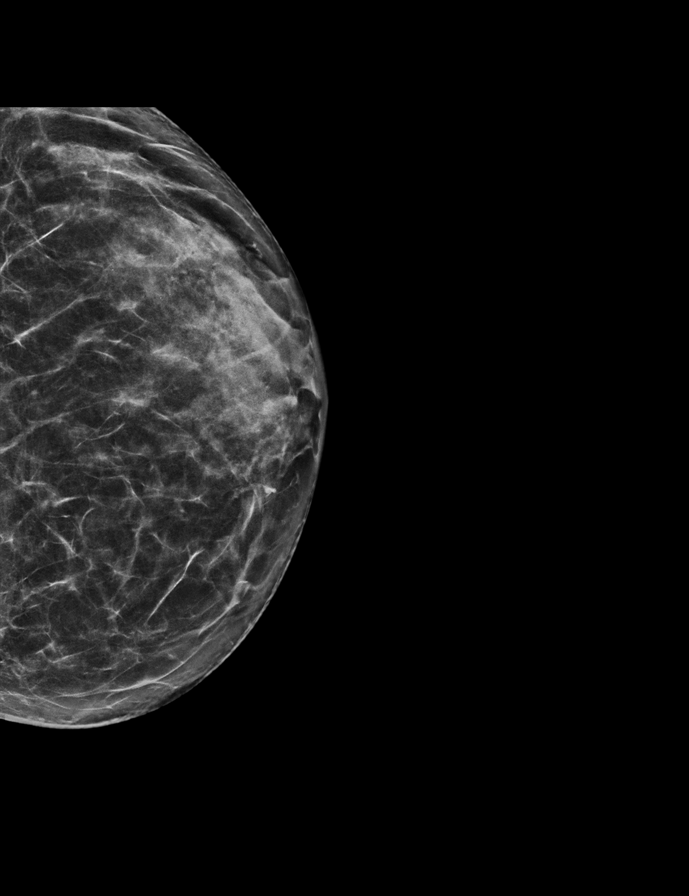

[L MLO synth-2D]
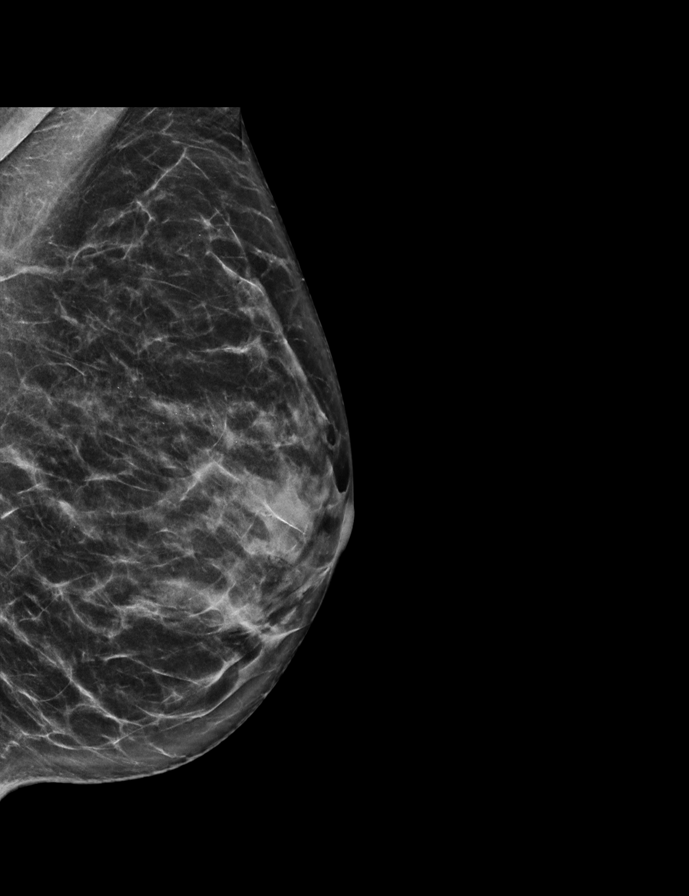

[R MLO synth-2D]
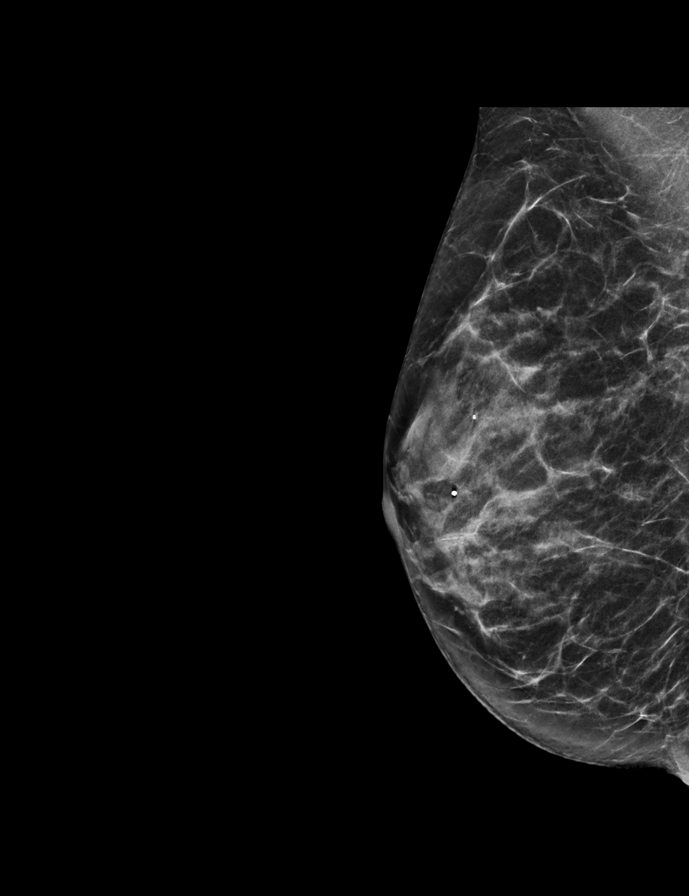

[R CC synth-2D]
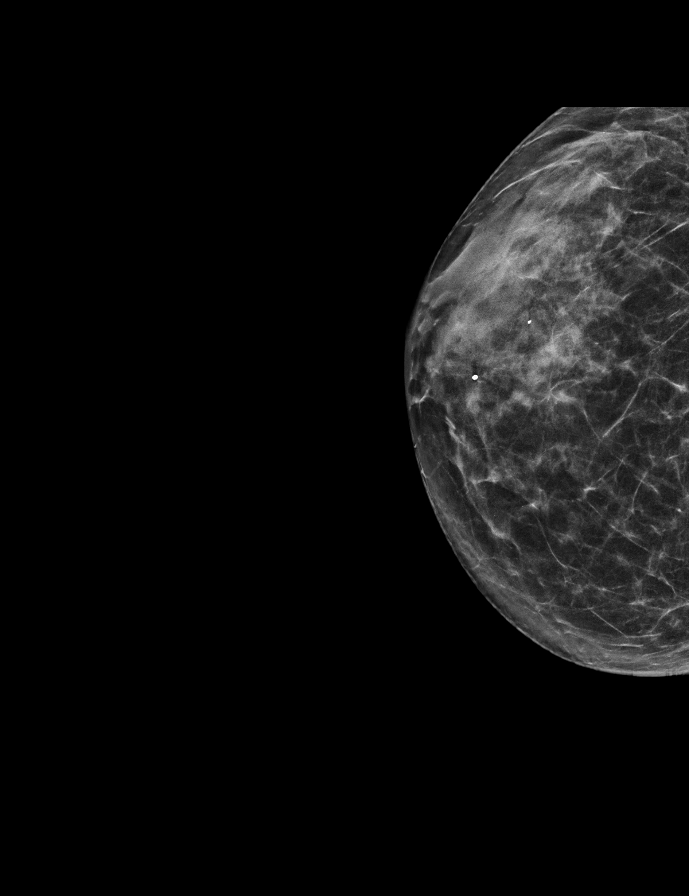

[L MLO tomo · 2 of 62 frames shown]
[frame 21/62]
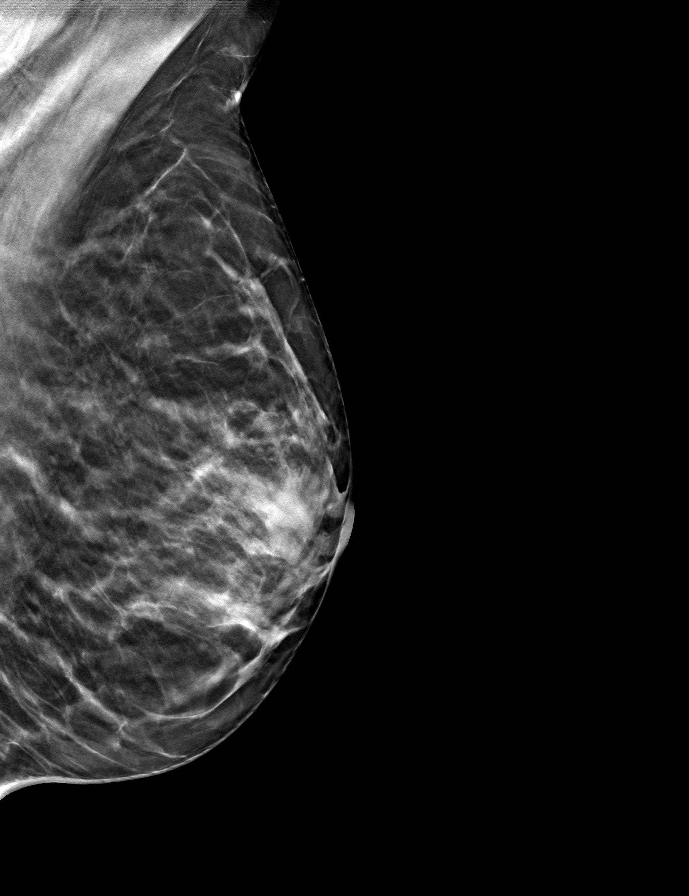
[frame 31/62]
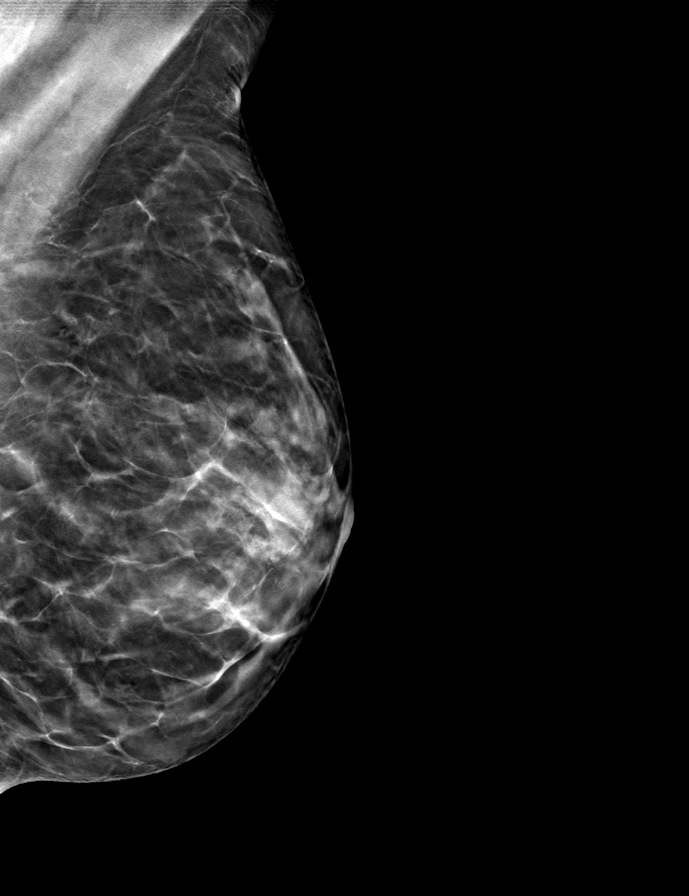

[L CC tomo · tomo slice 31/62.0]
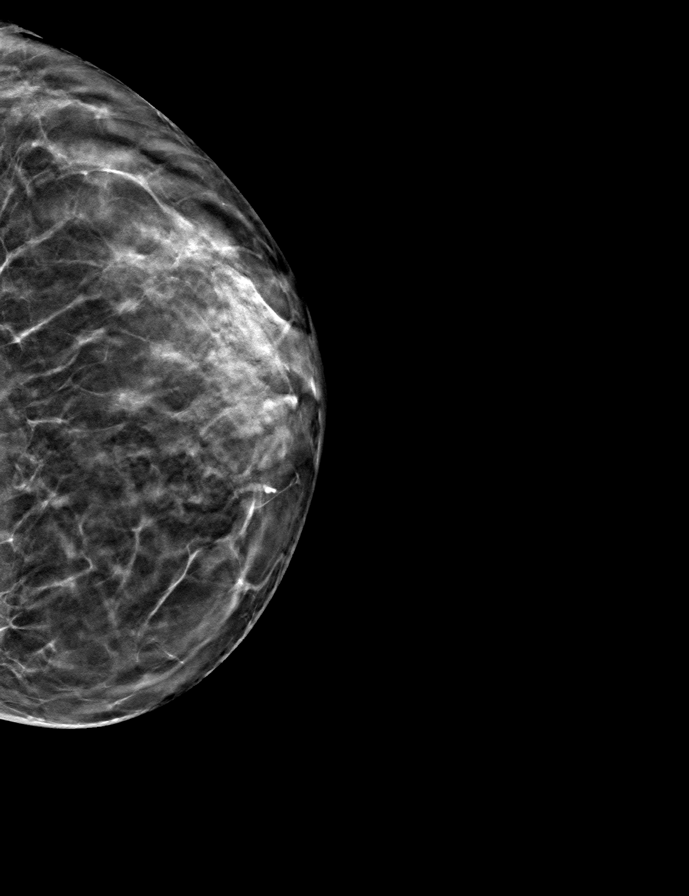

[R CC tomo · tomo slice 29/57.0]
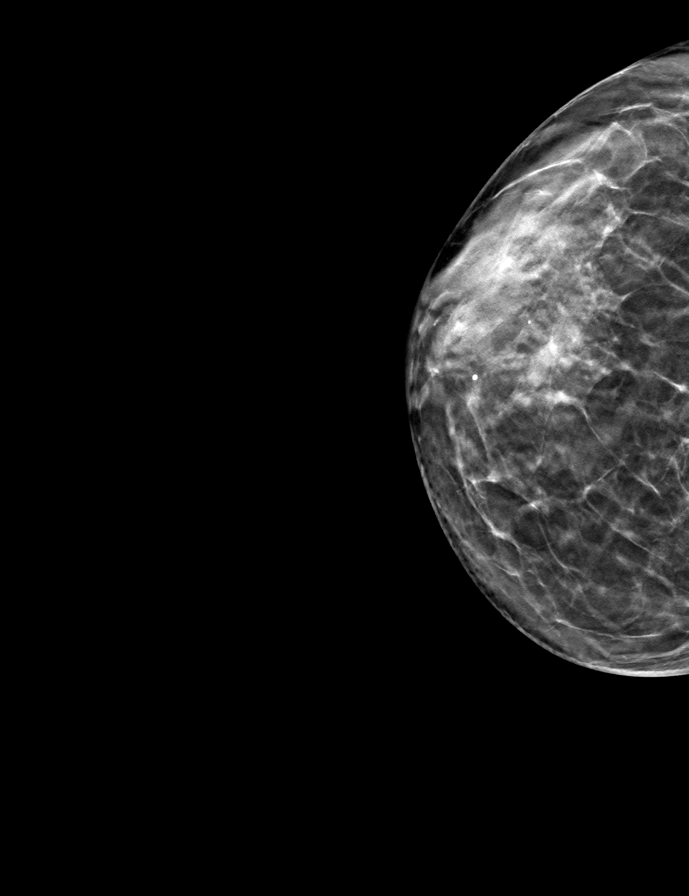

[R MLO tomo · tomo slice 29/56.0]
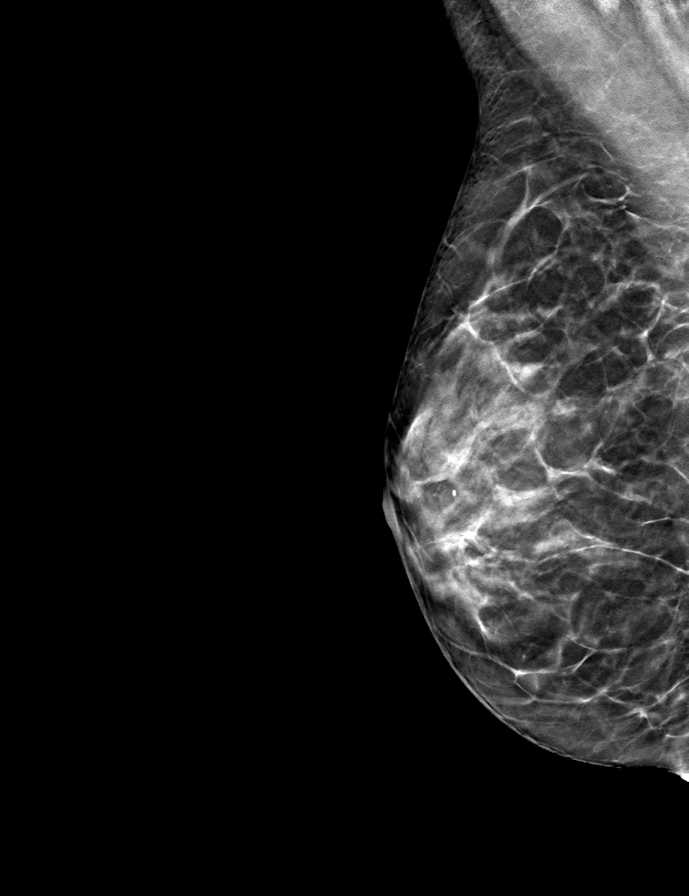

[9 of 24 positions shown; findings below may reference images not displayed]

ACR Breast Density Category c: The breast tissue is heterogeneously
dense, which may obscure small masses.
FINDINGS: There are no findings suspicious for malignancy.
IMPRESSION: No mammographic evidence of malignancy. A result letter of this
screening mammogram will be mailed directly to the patient.

RECOMMENDATION:
Screening mammogram in one year. (Code:[V2])

BI-RADS CATEGORY  1: Negative.

## 2021-09-05 ENCOUNTER — Other Ambulatory Visit: Payer: Self-pay

## 2021-09-05 ENCOUNTER — Ambulatory Visit (INDEPENDENT_AMBULATORY_CARE_PROVIDER_SITE_OTHER): Payer: Managed Care, Other (non HMO) | Admitting: Sports Medicine

## 2021-09-05 VITALS — BP 112/78 | Ht 66.0 in | Wt 140.0 lb

## 2021-09-05 DIAGNOSIS — M722 Plantar fascial fibromatosis: Secondary | ICD-10-CM | POA: Diagnosis not present

## 2021-09-05 DIAGNOSIS — G8929 Other chronic pain: Secondary | ICD-10-CM | POA: Diagnosis not present

## 2021-09-05 DIAGNOSIS — M79672 Pain in left foot: Secondary | ICD-10-CM

## 2021-09-05 NOTE — Assessment & Plan Note (Signed)
We need to confirm if this is really PF as this has been so persistent  MRI pending

## 2021-09-05 NOTE — Progress Notes (Signed)
PCP: Kelton Pillar, MD  Subjective:   HPI: [name] is a pleasant 56 y.o. female here for relcalcitrant left heel pain.  Patient has had pain in the heel over the last 2 years.  She has tried multiple treatment modalities without significant help.  She has been treated for plantar fasciitis without improvement.  At last visit we thought this could be a component of Baxters neuropathy and she was tried on gabapentin 300 mg 3 times daily which she has been taking faithfully and not seeing any improvement.  She has been paying attention to where her pain is, and states it is both on the medial and the lateral side of the calcaneus.  She denies any redness, swelling or ecchymosis.  She does get pain throughout the day, it is worse with prolonged activity such as walking.  Describes a throbbing pain.  Treatments include: Multiple corticosteroid injections, oral prednisone, NSAIDs, shockwave therapy, gabapentin.  All of the stated helped slightly or not much at all.  Did receive almost complete resolution of pain on about day 3 of 1 Medrol Dosepak, but then this returned shortly after finishing course.   BP 112/78    Ht 5\' 6"  (1.676 m)    Wt 140 lb (63.5 kg)    BMI 22.60 kg/m   No flowsheet data found.  No flowsheet data found.      Objective:  Physical Exam:  Gen: Well-appearing, in no acute distress; non-toxic CV: Regular Rate. Well-perfused. Warm.  Resp: Breathing unlabored on room air; no wheezing. Psych: Fluid speech in conversation; appropriate affect; normal thought process Neuro: Sensation intact throughout. No gross coordination deficits.  MSK:  - Left foot: -Inspection: No significant swelling, edema, erythema or warmth at the heel.  With standing, patient has some loss of transverse arch with mild splaying of the second and third toes. With walking, mild calcaneal valgus and longitudinal arch collapse seen without pes planus.  With shoes/orthotics on, she has near complete  correction of this. -Palpation: Tender to palpation at the medial plantar band insertion of the plantar fascia.  However, there is more significant TTP on both the lateral and medial aspects of the posterior calcaneus.  Positive squeeze test of calcaneus. -ROM: Full range of motion of the foot and ankle -Strength: 5/5 strength in all planes of the foot and ankle -Neurovascular intact distally, negative Tinel's at Baxter's nerve  Limited US examination of left heel: -Long axis evaluation of the plantar fascia was identified approximately 0.45 cm in thickness, without notable thickening.  There is a very small area of hypoechoic region which was identified on last ultrasound.  There is a small lateral calcaneal spur which was noted on last ultrasound as well. -Evaluation of tibial nerve was found with good normal nerve echotexture.  Following this distally into the split of the lateral medial plantar naris was identified without calcification or entrapment seen on ultrasound with limited view.  Impression: Evaluation of plantar fascia insertion without thickened plantar fascia or evidence of tearing.  Normal tibial-plantar nerve scan.  Small left calcaneal spur.   Assessment & Plan:  1.  Recalcitrant  left heel pain  At this point, the patient has had ongoing left heel pain for about 2 years.  She has failed conservative measures for plantar fasciitis.  She did not get any improvement with gabapentin which lessens the likelihood of possible Baxters neuritis.  She has been adherent to her custom orthotic use.  Given her lack of improvement and her pain  with calcaneal squeeze test, I am concerned about possible calcaneal stress fracture.  I do feel like it is pertinent to rule this out at that standpoint.  Given this, we will move forward with MRI of the left heel without contrast if this does not show anything significant, would consider possible sural nerve versus occult Baxters neuropathy versus  recalcitrant plantar fasciitis.  We will follow-up after MRI.  Elba Barman, DO PGY-4, Sports Medicine Fellow [name]  I observed and examined the patient with the [facility] resident and agree with assessment and plan.  Note reviewed and modified by me. Ila Mcgill, MD

## 2021-09-05 NOTE — Progress Notes (Signed)
Pt called back stating she is interested in tapering off of the gapapentin. Per Dr Oneida Alar, ok to start taking one tab every other day for a week, then one tab every third day for another week, then stop

## 2021-09-15 ENCOUNTER — Other Ambulatory Visit: Payer: Self-pay

## 2021-09-15 ENCOUNTER — Telehealth: Payer: Self-pay | Admitting: Sports Medicine

## 2021-09-15 MED ORDER — DIAZEPAM 5 MG PO TABS: ORAL_TABLET | ORAL | 0 refills | Status: DC

## 2021-09-15 NOTE — Telephone Encounter (Signed)
Patient called requested Rx for Valium (scheduled to have MRI on this weekend) ? ?--Pls send order for Rx to:  ? ?[id number] [place], [place] - [address] AT [facility] OF [address] & [facility] [address] Phone:  [phone]  ?Fax:  [phone]  ?  ?--glh ?

## 2021-09-17 ENCOUNTER — Ambulatory Visit
Admission: RE | Admit: 2021-09-17 | Discharge: 2021-09-17 | Disposition: A | Discharge status: Home / Self Care | Payer: Managed Care, Other (non HMO) | Source: Ambulatory Visit | Attending: Sports Medicine | Admitting: Sports Medicine

## 2021-09-17 ENCOUNTER — Other Ambulatory Visit: Payer: Self-pay

## 2021-09-17 DIAGNOSIS — G8929 Other chronic pain: Secondary | ICD-10-CM

## 2021-09-17 DIAGNOSIS — M79672 Pain in left foot: Secondary | ICD-10-CM

## 2021-09-17 IMAGING — MR MR HEEL *L* W/O CM
5 series · 40 of 40 positions shown · non-contrast
Comparison: None.

CLINICAL DATA: Left heel pain

EXAM:
MR OF THE LEFT HEEL WITHOUT CONTRAST
TECHNIQUE: Multiplanar, multisequence MR imaging of the left heel/hindfoot was
performed. No intravenous contrast was administered.

[Series 4: T2 fat-sat · axial · 3.0mm · 0.62mm/px · z∈[-20,+108]mm · 9 of 34 slices shown (1 of 2)]
[im 1/34]
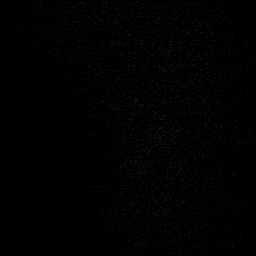
[im 5/34]
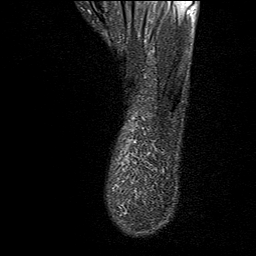
[im 9/34]
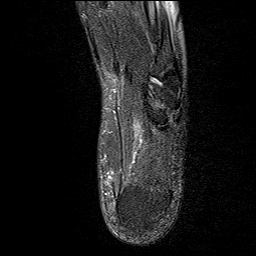
[im 13/34]
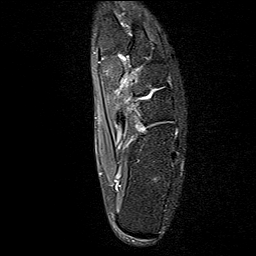
[im 17/34]
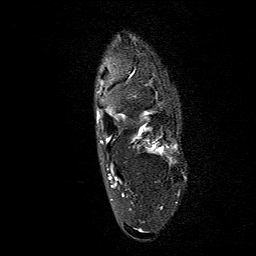
[im 21/34]
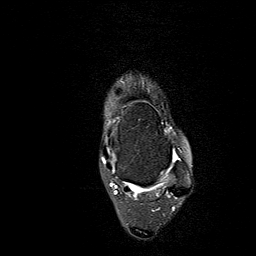
[im 25/34]
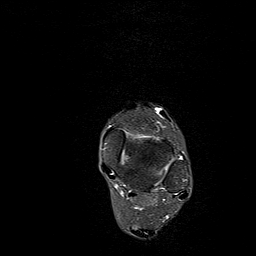
[im 29/34]
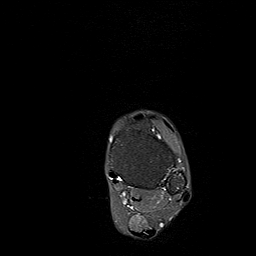
[im 34/34]
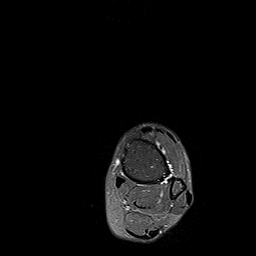

[Series 5: PD fat-sat · axial · 3.0mm · 0.50mm/px · z∈[-20,+108]mm · 9 of 34 slices shown]
[im 1/34]
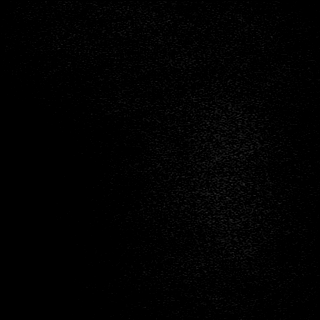
[im 5/34]
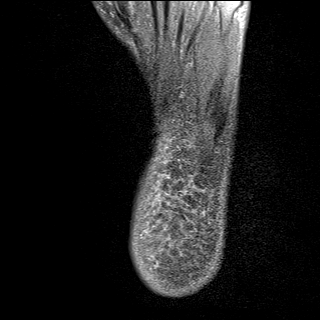
[im 9/34]
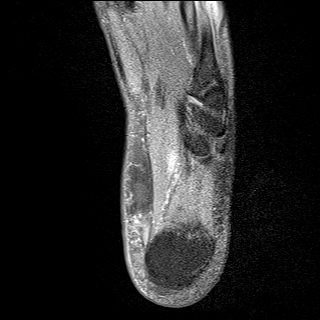
[im 13/34]
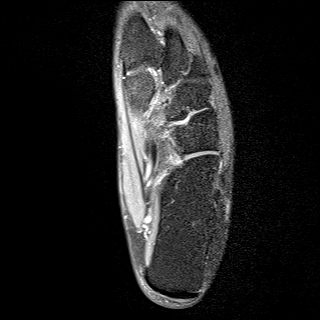
[im 17/34]
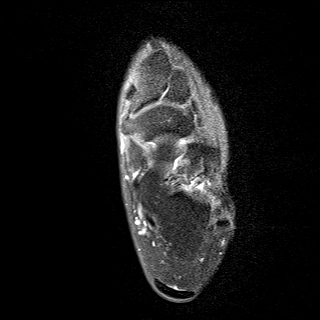
[im 21/34]
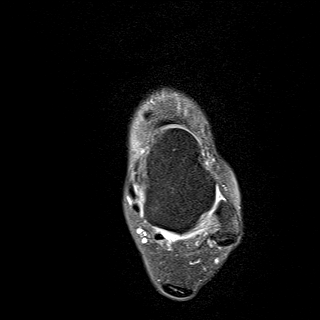
[im 25/34]
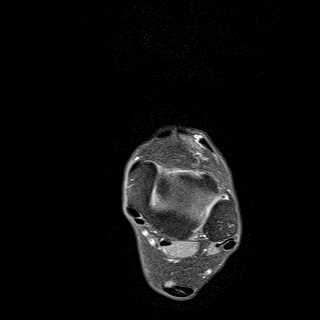
[im 29/34]
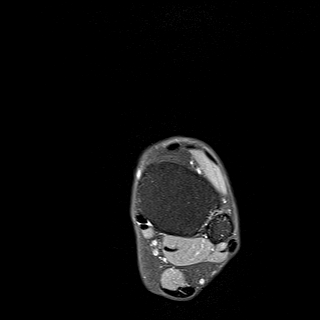
[im 34/34]
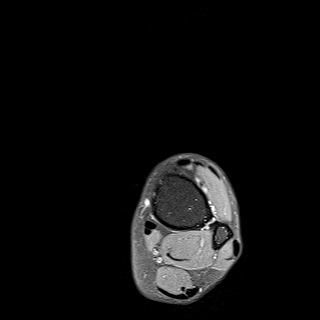

[Series 6: T1 · sagittal · 4.0mm · 0.70mm/px · 6 of 21 slices shown]
[im 1/21]
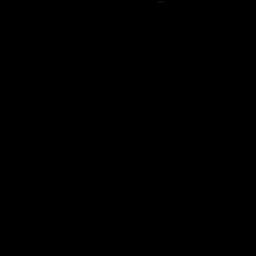
[im 5/21]
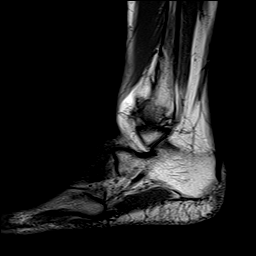
[im 9/21]
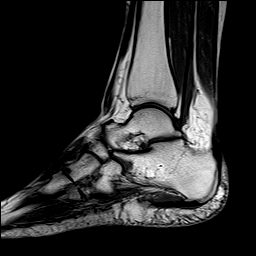
[im 13/21]
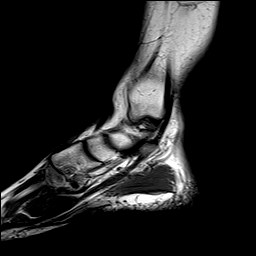
[im 17/21]
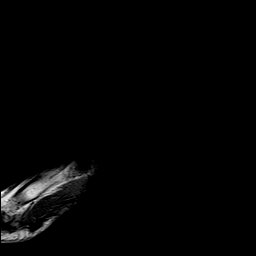
[im 21/21]
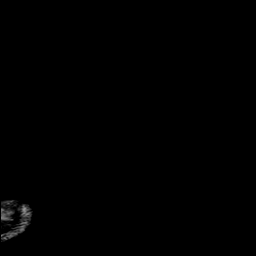

[Series 7: STIR · sagittal · 4.0mm · 0.35mm/px · 6 of 21 slices shown]
[im 1/21]
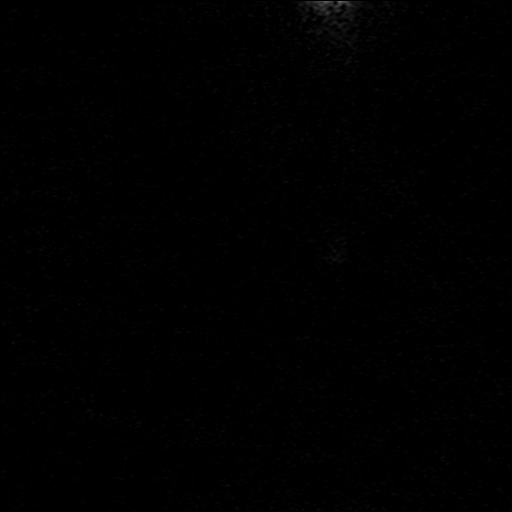
[im 5/21]
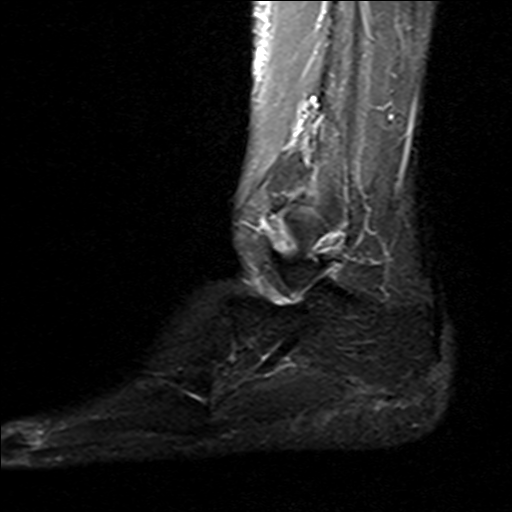
[im 9/21]
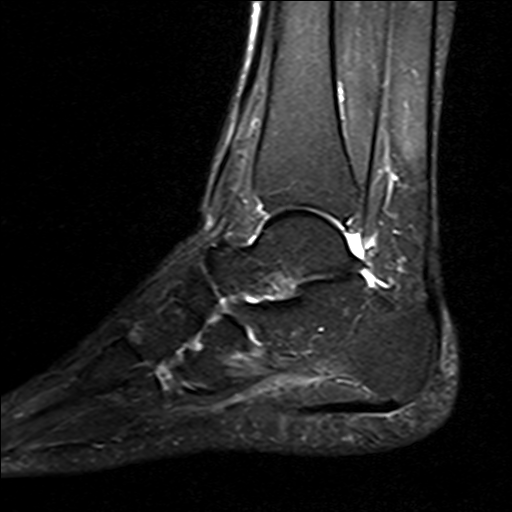
[im 13/21]
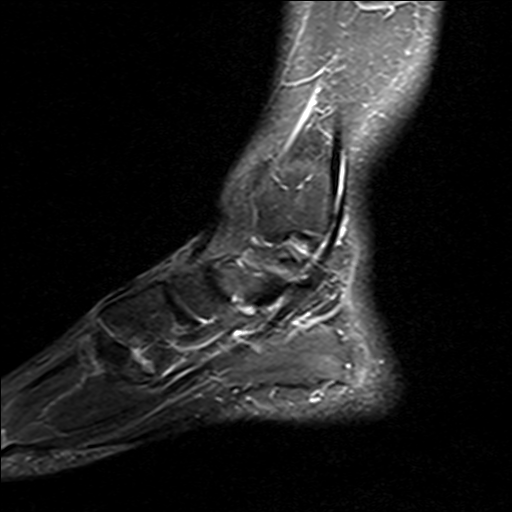
[im 17/21]
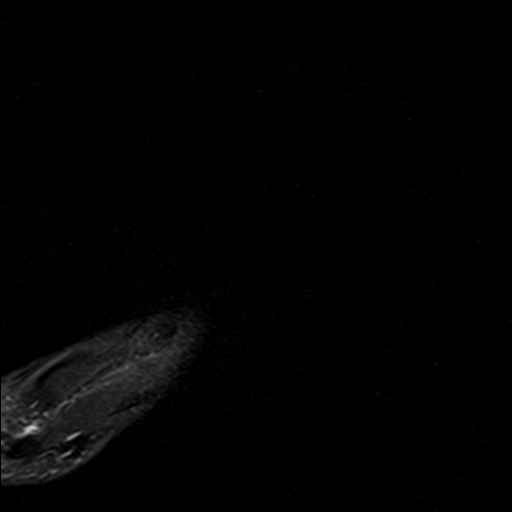
[im 21/21]
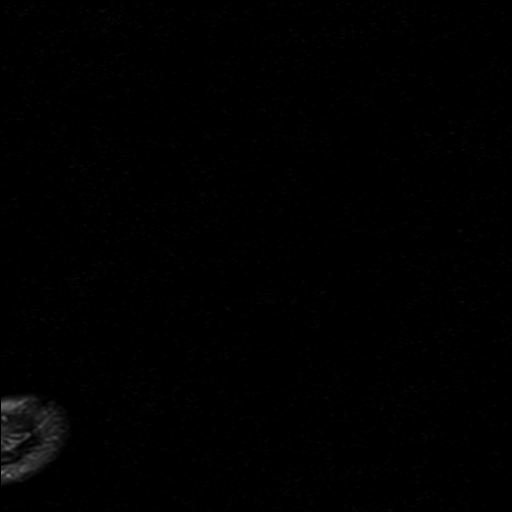

[Series 8: T2 fat-sat · coronal · 3.0mm · 0.62mm/px · 10 of 38 slices shown (2 of 2)]
[im 1/38]
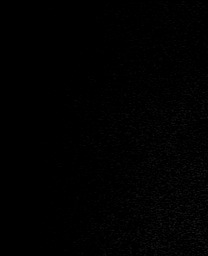
[im 5/38]
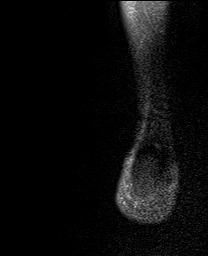
[im 9/38]
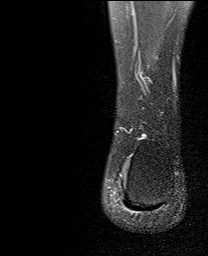
[im 13/38]
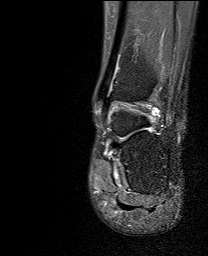
[im 17/38]
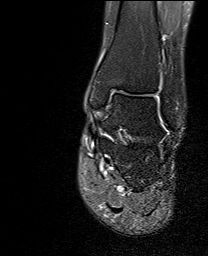
[im 21/38]
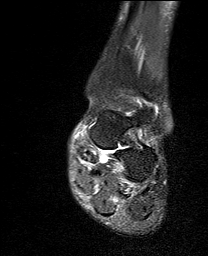
[im 25/38]
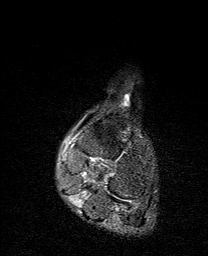
[im 29/38]
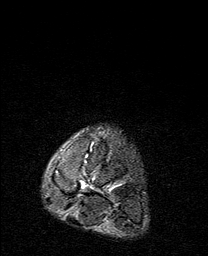
[im 33/38]
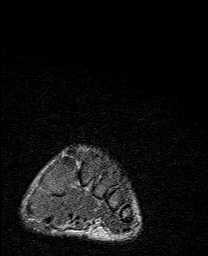
[im 38/38]
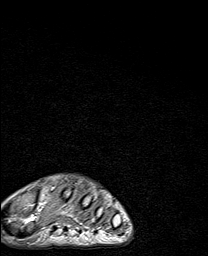

[40 of 40 positions shown; findings below may reference images not displayed]

FINDINGS: TENDONS

Peroneal: Peroneal longus tendon intact. There is flattening of the
peroneus brevis without definite evidence of split tear. Distal
peroneus brevis tendon is intact.

Posteromedial: Posterior tibial tendon intact. Flexor hallucis
longus tendon intact. Flexor digitorum longus tendon intact.

Anterior: Tibialis anterior tendon intact. Extensor hallucis longus
tendon intact Extensor digitorum longus tendon intact.

Achilles:  Intact.

Plantar Fascia: Plantar fascia is normal in size. There is minimal
edema about the inferior aspect of the plantar fascia, which may
represent mild plantar fasciitis.

LIGAMENTS

Lateral: Anterior talofibular ligament intact. Calcaneofibular
ligament intact. Posterior talofibular ligament intact. Anterior and
posterior tibiofibular ligaments intact.

Medial: Deltoid ligament intact. Spring ligament intact.

CARTILAGE

Ankle Joint: No joint effusion. Normal ankle mortise. No chondral
defect.

Subtalar Joints/Sinus Tarsi: Normal subtalar joints. No subtalar
joint effusion. Normal sinus tarsi.

Bones: No marrow signal abnormality.  No fracture or dislocation.

Soft Tissue: No fluid collection or hematoma. Muscles are normal
without edema or atrophy. Tarsal tunnel is normal.
IMPRESSION: 1.  Mild plantar fasciitis.

2.  No evidence of fracture or dislocation.

3.  Ligaments and tendons appear intact.

## 2021-09-20 ENCOUNTER — Ambulatory Visit: Payer: Managed Care, Other (non HMO) | Admitting: Obstetrics and Gynecology

## 2021-11-15 ENCOUNTER — Ambulatory Visit: Payer: Managed Care, Other (non HMO) | Admitting: Obstetrics and Gynecology

## 2021-11-15 ENCOUNTER — Encounter: Payer: Self-pay | Admitting: Obstetrics and Gynecology

## 2021-11-15 VITALS — BP 107/72 | HR 81

## 2021-11-15 DIAGNOSIS — N393 Stress incontinence (female) (male): Secondary | ICD-10-CM

## 2021-11-15 NOTE — Progress Notes (Signed)
Urogynecology ?Return Visit ? ?SUBJECTIVE  ?History of Present Illness: ?[name] is a 56 y.o. female seen in follow-up for stress urinary incontinence. Plan at last visit was to start pelvic physical therapy.  ? ?She was diagnosed with spondylolisthesis and required PT for her back so she was unable to complete PT for her leakage. She feels that her back is in a good place now and interested in pursuing treatment for her SUI.  ? ?Surg history: ?2018: S/P Abdominal sacrocolpopexy, bilateral salpingectomy, Burch, paravaginal repair, and cystoscopy ?2006: S/P TVH, Left paravaginal repair, anterior and posterior repair, and cystoscopy ?  ?Past Medical History: ?Patient  has a past medical history of Allergy, Breast mass, right, Complication of anesthesia, GERD (gastroesophageal reflux disease), and Seizures (HCC).  ? ?Past Surgical History: ?She  has a past surgical history that includes Eye surgery; Bunionectomy (Bilateral, 1986); Rotator cuff repair (Right, 2012); Ureter surgery; Abdominal hysterectomy (2006); Breast lumpectomy with radioactive seed localization (Right, 09/23/2015); Breast biopsy (Right, 08/31/2015); Breast excisional biopsy (Right, 09/23/2015); and Abdominal sacrocolpopexy.  ? ?Medications: ?She has a current medication list which includes the following prescription(s): acetaminophen, cyclosporine, diazepam, estradiol, flaxseed (linseed), gabapentin, ibuprofen, methylprednisolone, multiple vitamins-minerals, multiple vitamins-minerals, multivitamin with minerals, NON FORMULARY, omeprazole, oxycodone-acetaminophen, tretinoin, and triamcinolone.  ? ?Allergies: ?Patient is allergic to bee venom, keflex [cephalexin], sulfa antibiotics, and latex.  ? ?Social History: ?Patient  reports that she has never smoked. She does not have any smokeless tobacco history on file. She reports current alcohol use. She reports that she does not use drugs.  ?  ?  ?OBJECTIVE  ?  ? ?Physical  Exam: ?Vitals:  ? 11/15/21 1557  ?BP: 107/72  ?Pulse: 81  ? ?Gen: No apparent distress, A&O x 3. ? ?Detailed Urogynecologic Evaluation:  ?Deferred. Prior exam showed: ?POP-Q:  ?  ?POP-Q ?  ?-2.5  ?                                          Aa   ?-2.5 ?                                          Ba   ?-9  ?                                            C  ?  ?3  ?                                          Gh   ?4  ?                                          Pb   ?9  ?                                          tvl  ?  ?-  2  ?                                          Ap   ?-2  ?                                          Bp   ?   ?                                            D  ?   ? ?ASSESSMENT AND PLAN  ?  ?Ms. Inabinet is a 56 y.o. with:  ?1. SUI (stress urinary incontinence, female)   ? ?- We reviewed non-surgical options including physical therapy, as well as a pessary.  Surgical options include a midurethral sling, and transurethral injection of a bulking agent. ?- She would like to try physical therapy, referral placed ? ?Follow up as needed ? ?Marguerita Beards, MD ? ?Time spent: I spent 20 minutes dedicated to the care of this patient on the date of this encounter to include pre-visit review of records, face-to-face time with the patient and post visit documentation. ? ? ? ?

## 2022-01-15 ENCOUNTER — Ambulatory Visit: Payer: Managed Care, Other (non HMO) | Attending: Obstetrics and Gynecology | Admitting: Physical Therapy

## 2022-01-15 ENCOUNTER — Other Ambulatory Visit: Payer: Self-pay

## 2022-01-15 DIAGNOSIS — R279 Unspecified lack of coordination: Secondary | ICD-10-CM | POA: Insufficient documentation

## 2022-01-15 DIAGNOSIS — M6281 Muscle weakness (generalized): Secondary | ICD-10-CM | POA: Insufficient documentation

## 2022-01-15 NOTE — Therapy (Signed)
OUTPATIENT PHYSICAL THERAPY FEMALE PELVIC EVALUATION   Patient Name: [name] MRN: [id number] DOB:02/04/1966, 56 y.o., female Today's Date: 01/15/2022   PT End of Session - 01/15/22 1747     Visit Number 1    Date for PT Re-Evaluation 04/09/22    Authorization Type cigna    PT Start Time 1449    PT Stop Time 1525    PT Time Calculation (min) 36 min    Activity Tolerance Patient tolerated treatment well    Behavior During Therapy WFL for tasks assessed/performed             Past Medical History:  Diagnosis Date   Allergy    Breast mass, right    Complication of anesthesia    hard to wake up   GERD (gastroesophageal reflux disease)    Seizures (HCC)    as teen, no meds since age 42, no seizures   Past Surgical History:  Procedure Laterality Date   ABDOMINAL HYSTERECTOMY  2006   AP repair also   ABDOMINAL SACROCOLPOPEXY     with paravaginal repair   BREAST BIOPSY Right 08/31/2015   BREAST EXCISIONAL BIOPSY Right 09/23/2015   BREAST LUMPECTOMY WITH RADIOACTIVE SEED LOCALIZATION Right 09/23/2015   Procedure: BREAST LUMPECTOMY WITH RADIOACTIVE SEED LOCALIZATION;  Surgeon: Chevis Pretty III, MD;  Location: [facility];  Service: General;  Laterality: Right;   BUNIONECTOMY Bilateral 1986   EYE SURGERY     lasik   ROTATOR CUFF REPAIR Right 2012   URETER SURGERY     as child   Patient Active Problem List   Diagnosis Date Noted   Spondylolisthesis at L5-S1 level 03/16/2021   Plantar fasciitis of left foot 12/13/2020   METATARSALGIA 07/30/2007    PCP: Maurice Small, MD   REFERRING PROVIDER: Marguerita Beards, MD  REFERRING DIAG: N39.3 (ICD-10-CM) - SUI (stress urinary incontinence, female)  THERAPY DIAG:  Muscle weakness (generalized) - Plan: PT plan of care cert/re-cert  Unspecified lack of coordination - Plan: PT plan of care cert/re-cert  Rationale for Evaluation and Treatment Rehabilitation  ONSET DATE: October 2022  SUBJECTIVE:                                                                                                                                                                                            SUBJECTIVE STATEMENT: I had PT and worked on my spondylolisthesis and that helped the leakage a little.  When I sneeze with a full bladder then I have to do a lot to control my bladder.  I have sharp pain every once in a while with intercourse  when hitting the wrong spot. I have the elvie but it gets pushed over to one side and doesn't pick up the signal  Fluid intake: Yes: moderate amount     PAIN:  Are you having pain? No  PRECAUTIONS: None  WEIGHT BEARING RESTRICTIONS No  FALLS:  Has patient fallen in last 6 months? No  LIVING ENVIRONMENT: Lives with: lives with their spouse Lives in: House/apartment  OCCUPATION: full time; school principal  PLOF: Independent  PATIENT GOALS  not have leakage  PERTINENT HISTORY:  abdominal hysterectomy 2006, breast cancer lumpectomy and radiation 2017, L5/S1 spondylolisthesis Sexual abuse: No  BOWEL MOVEMENT Pain with bowel movement: No   URINATION Pain with urination: No Fully empty bladder: Yes:   Stream: Strong Urgency: No Frequency: nocturia 2; can wait a long time during the day Leakage: Coughing, Sneezing, Exercise, and stepping up on the curb Pads: Yes: smallest poise pad to liner during the worst, but not currently  INTERCOURSE Pain with intercourse:  no   PREGNANCY Vaginal deliveries 1   PROLAPSE Urethrocele      OBJECTIVE:   DIAGNOSTIC FINDINGS:    PATIENT SURVEYS:    PFIQ-7 (do next)  COGNITION:  Overall cognitive status: Within functional limits for tasks assessed       MUSCLE LENGTH:   LUMBAR SPECIAL TESTS:    FUNCTIONAL TESTS:  Single leg stand - Rt initial LOB that was self corrected; Lt stable  GAIT:  Comments: WFL               POSTURE: rounded shoulders   PELVIC  ALIGNMENT:  LUMBARAROM/PROM  A/PROM A/PROM  eval  Flexion   Extension   Right lateral flexion   Left lateral flexion   Right rotation   Left rotation    (Blank rows = not tested)  LOWER EXTREMITY ROM:  Passive ROM Right eval Left eval  Hip flexion    Hip extension    Hip abduction    Hip adduction    Hip internal rotation    Hip external rotation    Knee flexion    Knee extension    Ankle dorsiflexion    Ankle plantarflexion    Ankle inversion    Ankle eversion     (Blank rows = not tested)  LOWER EXTREMITY MMT:  MMT Right eval Left eval  Hip flexion 5/5 5/5  Hip extension 5/5 5/5  Hip abduction 4/5 5/5  Hip adduction 5/5 5/5  Hip internal rotation 4/5 5/5  Hip external rotation 5/5 5/5  Knee flexion    Knee extension    Ankle dorsiflexion    Ankle plantarflexion    Ankle inversion    Ankle eversion      PALPATION:   General                  External Perineal Exam descended perineal body                             Internal Pelvic Floor Lt side fascial restrictions and tenderness all the way up the urethra to bladder; Rt side lower tone, decreased coordination unable to knack, using posterior pelvic floor more than anterior  Patient confirms identification and approves PT to assess internal pelvic floor and treatment Yes  PELVIC MMT:   MMT eval  Vaginal 3/5 x 12 sec - can do 10 quick flicks but elevates from the posterior pelvic floor a little more  Internal  Anal Sphincter   External Anal Sphincter   Puborectalis   Diastasis Recti   (Blank rows = not tested)        TONE: Lower tone on the right side  PROLAPSE: Anterior wall  TODAY'S TREATMENT  EVAL    PATIENT EDUCATION:  Education details: none today Person educated: Patient Education method: Explanation, Demonstration, Tactile cues, Verbal cues, and Handouts Education comprehension: verbalized understanding and returned demonstration   HOME EXERCISE PROGRAM: None  today  ASSESSMENT:  CLINICAL IMPRESSION: Patient is a 56 y.o. female who was seen today for physical therapy evaluation and treatment for stress incontinence. Pt demosntrates 3/5 MMT of the pelvic floor, but lacks coordination and power to hold against a cough.  There is bulging of the anterior wall.  Pt with other weakness and posture as mentioned above.  She will benefit from skilled PT to address all mentioned impairments in order to restore maximum function without leakage   OBJECTIVE IMPAIRMENTS decreased coordination, decreased endurance, decreased strength, and pain.   ACTIVITY LIMITATIONS  coughing/sneezing and stepping off pattern  PARTICIPATION LIMITATIONS: interpersonal relationship and community activity  PERSONAL FACTORS 3+ comorbidities: abdominal hysterectomy 2006, breast cancer lumpectomy and radiation 2017, L5/S1 spondylolisthesis  are also affecting patient's functional outcome.   REHAB POTENTIAL: Excellent  CLINICAL DECISION MAKING: Evolving/moderate complexity  EVALUATION COMPLEXITY: Moderate   GOALS: Goals reviewed with patient? Yes  SHORT TERM GOALS: Target date: 02/12/2022  Ind with initial HEP Baseline: Goal status: INITIAL     LONG TERM GOALS: Target date: 04/09/2022   Pt able to knack with 4/5 MMT for coughing without leakage Baseline:  Goal status: INITIAL  2.  Pt will be independent with advanced HEP to maintain improvements made throughout therapy  Baseline:  Goal status: INITIAL  3.  Pt will be able to functional actions such as change directions when walking without leakage  Baseline:  Goal status: INITIAL  4.  Pt will be able to cough for 20 seconds if having a coughing fit without leakage or discomfort due to improved endurance of 20 sec pelvic floor hold Baseline:  Goal status: INITIAL  5.  Pt will report 50% reduction of pain with palpation around the bladder and urethra for reduced pain during intercourse  Baseline:  Goal  status: INITIAL   PLAN: PT FREQUENCY: 1x/week  PT DURATION: 12 weeks  PLANNED INTERVENTIONS: Therapeutic exercises, Therapeutic activity, Neuromuscular re-education, Balance training, Gait training, Patient/Family education, Joint mobilization, Dry Needling, Electrical stimulation, Spinal mobilization, Cryotherapy, Moist heat, Taping, Biofeedback, and Manual therapy  PLAN FOR NEXT SESSION: internal STM to release scar and fascial restrictions Lt side; kegels isolated with exhale (can do at home with elvie for feedback if helpful)   Brayton Caves Lam Bjorklund, PT 01/15/2022, 5:48 PM

## 2022-02-06 ENCOUNTER — Encounter: Payer: Managed Care, Other (non HMO) | Admitting: Physical Therapy

## 2022-02-15 ENCOUNTER — Ambulatory Visit: Payer: Managed Care, Other (non HMO) | Admitting: Physical Therapy

## 2022-02-15 NOTE — Therapy (Deleted)
OUTPATIENT PHYSICAL THERAPY FEMALE PELVIC TREATMENT   Patient Name: [name] MRN: [id number] DOB:[birth year], 56 y.o., female Today's Date: 02/15/2022     Past Medical History:  Diagnosis Date   Allergy    Breast mass, right    Complication of anesthesia    hard to wake up   GERD (gastroesophageal reflux disease)    Seizures (HCC)    as teen, no meds since age 12, no seizures   Past Surgical History:  Procedure Laterality Date   ABDOMINAL HYSTERECTOMY  2006   AP repair also   ABDOMINAL SACROCOLPOPEXY     with paravaginal repair   BREAST BIOPSY Right 08/31/2015   BREAST EXCISIONAL BIOPSY Right 09/23/2015   BREAST LUMPECTOMY WITH RADIOACTIVE SEED LOCALIZATION Right 09/23/2015   Procedure: BREAST LUMPECTOMY WITH RADIOACTIVE SEED LOCALIZATION;  Surgeon: Chevis Pretty III, MD;  Location: [facility];  Service: General;  Laterality: Right;   BUNIONECTOMY Bilateral 1986   EYE SURGERY     lasik   ROTATOR CUFF REPAIR Right 2012   URETER SURGERY     as child   Patient Active Problem List   Diagnosis Date Noted   Spondylolisthesis at L5-S1 level 03/16/2021   Plantar fasciitis of left foot 12/13/2020   METATARSALGIA 07/30/2007    PCP: Maurice Small, MD   REFERRING PROVIDER: Marguerita Beards, MD  REFERRING DIAG: N39.3 (ICD-10-CM) - SUI (stress urinary incontinence, female)  THERAPY DIAG:  No diagnosis found.  Rationale for Evaluation and Treatment Rehabilitation  ONSET DATE: October 2022  SUBJECTIVE:                                                                                                                                                                                           SUBJECTIVE STATEMENT: I had PT and worked on my spondylolisthesis and that helped the leakage a little.  When I sneeze with a full bladder then I have to do a lot to control my bladder.  I have sharp pain every once in a while with intercourse when hitting the wrong  spot. I have the elvie but it gets pushed over to one side and doesn't pick up the signal  Fluid intake: Yes: moderate amount     PAIN:  Are you having pain? No  PRECAUTIONS: None  WEIGHT BEARING RESTRICTIONS No  FALLS:  Has patient fallen in last 6 months? No  LIVING ENVIRONMENT: Lives with: lives with their spouse Lives in: House/apartment  OCCUPATION: full time; school principal  PLOF: Independent  PATIENT GOALS  not have leakage  PERTINENT HISTORY:  abdominal hysterectomy 2006, breast cancer lumpectomy and radiation  2017, L5/S1 spondylolisthesis Sexual abuse: No  BOWEL MOVEMENT Pain with bowel movement: No   URINATION Pain with urination: No Fully empty bladder: Yes:   Stream: Strong Urgency: No Frequency: nocturia 2; can wait a long time during the day Leakage: Coughing, Sneezing, Exercise, and stepping up on the curb Pads: Yes: smallest poise pad to liner during the worst, but not currently  INTERCOURSE Pain with intercourse:  no   PREGNANCY Vaginal deliveries 1   PROLAPSE Urethrocele      OBJECTIVE:   DIAGNOSTIC FINDINGS:    PATIENT SURVEYS:    PFIQ-7 (do next)  COGNITION:  Overall cognitive status: Within functional limits for tasks assessed       MUSCLE LENGTH:   LUMBAR SPECIAL TESTS:    FUNCTIONAL TESTS:  Single leg stand - Rt initial LOB that was self corrected; Lt stable  GAIT:  Comments: WFL               POSTURE: rounded shoulders   PELVIC ALIGNMENT:  LUMBARAROM/PROM  A/PROM A/PROM  eval  Flexion   Extension   Right lateral flexion   Left lateral flexion   Right rotation   Left rotation    (Blank rows = not tested)  LOWER EXTREMITY ROM:  Passive ROM Right eval Left eval  Hip flexion    Hip extension    Hip abduction    Hip adduction    Hip internal rotation    Hip external rotation    Knee flexion    Knee extension    Ankle dorsiflexion    Ankle plantarflexion    Ankle inversion    Ankle  eversion     (Blank rows = not tested)  LOWER EXTREMITY MMT:  MMT Right eval Left eval  Hip flexion 5/5 5/5  Hip extension 5/5 5/5  Hip abduction 4/5 5/5  Hip adduction 5/5 5/5  Hip internal rotation 4/5 5/5  Hip external rotation 5/5 5/5  Knee flexion    Knee extension    Ankle dorsiflexion    Ankle plantarflexion    Ankle inversion    Ankle eversion      PALPATION:   General                  External Perineal Exam descended perineal body                             Internal Pelvic Floor Lt side fascial restrictions and tenderness all the way up the urethra to bladder; Rt side lower tone, decreased coordination unable to knack, using posterior pelvic floor more than anterior  Patient confirms identification and approves PT to assess internal pelvic floor and treatment Yes  PELVIC MMT:   MMT eval  Vaginal 3/5 x 12 sec - can do 10 quick flicks but elevates from the posterior pelvic floor a little more  Internal Anal Sphincter   External Anal Sphincter   Puborectalis   Diastasis Recti   (Blank rows = not tested)        TONE: Lower tone on the right side  PROLAPSE: Anterior wall  TODAY'S TREATMENT  EVAL    PATIENT EDUCATION:  Education details: none today Person educated: Patient Education method: Explanation, Demonstration, Tactile cues, Verbal cues, and Handouts Education comprehension: verbalized understanding and returned demonstration   HOME EXERCISE PROGRAM: None today  ASSESSMENT:  CLINICAL IMPRESSION: Patient is a 56 y.o. female who was seen today for physical therapy  evaluation and treatment for stress incontinence. Pt demosntrates 3/5 MMT of the pelvic floor, but lacks coordination and power to hold against a cough.  There is bulging of the anterior wall.  Pt with other weakness and posture as mentioned above.  She will benefit from skilled PT to address all mentioned impairments in order to restore maximum function without leakage   OBJECTIVE  IMPAIRMENTS decreased coordination, decreased endurance, decreased strength, and pain.   ACTIVITY LIMITATIONS  coughing/sneezing and stepping off pattern  PARTICIPATION LIMITATIONS: interpersonal relationship and community activity  PERSONAL FACTORS 3+ comorbidities: abdominal hysterectomy 2006, breast cancer lumpectomy and radiation 2017, L5/S1 spondylolisthesis  are also affecting patient's functional outcome.   REHAB POTENTIAL: Excellent  CLINICAL DECISION MAKING: Evolving/moderate complexity  EVALUATION COMPLEXITY: Moderate   GOALS: Goals reviewed with patient? Yes  SHORT TERM GOALS: Target date: 02/12/2022  Ind with initial HEP Baseline: Goal status: INITIAL     LONG TERM GOALS: Target date: 04/09/2022   Pt able to knack with 4/5 MMT for coughing without leakage Baseline:  Goal status: INITIAL  2.  Pt will be independent with advanced HEP to maintain improvements made throughout therapy  Baseline:  Goal status: INITIAL  3.  Pt will be able to functional actions such as change directions when walking without leakage  Baseline:  Goal status: INITIAL  4.  Pt will be able to cough for 20 seconds if having a coughing fit without leakage or discomfort due to improved endurance of 20 sec pelvic floor hold Baseline:  Goal status: INITIAL  5.  Pt will report 50% reduction of pain with palpation around the bladder and urethra for reduced pain during intercourse  Baseline:  Goal status: INITIAL   PLAN: PT FREQUENCY: 1x/week  PT DURATION: 12 weeks  PLANNED INTERVENTIONS: Therapeutic exercises, Therapeutic activity, Neuromuscular re-education, Balance training, Gait training, Patient/Family education, Joint mobilization, Dry Needling, Electrical stimulation, Spinal mobilization, Cryotherapy, Moist heat, Taping, Biofeedback, and Manual therapy  PLAN FOR NEXT SESSION: internal STM to release scar and fascial restrictions Lt side; kegels isolated with exhale (can do at  home with elvie for feedback if helpful)   Brayton Caves Kanon Novosel, PT 02/15/2022, 8:16 AM

## 2022-02-19 ENCOUNTER — Encounter: Payer: Self-pay | Admitting: Physical Therapy

## 2022-02-19 ENCOUNTER — Ambulatory Visit: Payer: Managed Care, Other (non HMO) | Attending: Obstetrics and Gynecology | Admitting: Physical Therapy

## 2022-02-19 DIAGNOSIS — R279 Unspecified lack of coordination: Secondary | ICD-10-CM | POA: Insufficient documentation

## 2022-02-19 DIAGNOSIS — M6281 Muscle weakness (generalized): Secondary | ICD-10-CM | POA: Insufficient documentation

## 2022-02-19 NOTE — Therapy (Signed)
OUTPATIENT PHYSICAL THERAPY FEMALE PELVIC TREATMENT   Patient Name: [name] MRN: [id number] DOB:November 09, 1965, 56 y.o., female Today's Date: 02/19/2022   PT End of Session - 02/19/22 1505     Visit Number 2    Date for PT Re-Evaluation 04/09/22    Authorization Type cigna    PT Start Time 1443    PT Stop Time 1526    PT Time Calculation (min) 43 min    Activity Tolerance Patient tolerated treatment well    Behavior During Therapy WFL for tasks assessed/performed              Past Medical History:  Diagnosis Date   Allergy    Breast mass, right    Complication of anesthesia    hard to wake up   GERD (gastroesophageal reflux disease)    Seizures (HCC)    as teen, no meds since age 27, no seizures   Past Surgical History:  Procedure Laterality Date   ABDOMINAL HYSTERECTOMY  2006   AP repair also   ABDOMINAL SACROCOLPOPEXY     with paravaginal repair   BREAST BIOPSY Right 08/31/2015   BREAST EXCISIONAL BIOPSY Right 09/23/2015   BREAST LUMPECTOMY WITH RADIOACTIVE SEED LOCALIZATION Right 09/23/2015   Procedure: BREAST LUMPECTOMY WITH RADIOACTIVE SEED LOCALIZATION;  Surgeon: Chevis Pretty III, MD;  Location: [facility];  Service: General;  Laterality: Right;   BUNIONECTOMY Bilateral 1986   EYE SURGERY     lasik   ROTATOR CUFF REPAIR Right 2012   URETER SURGERY     as child   Patient Active Problem List   Diagnosis Date Noted   Spondylolisthesis at L5-S1 level 03/16/2021   Plantar fasciitis of left foot 12/13/2020   METATARSALGIA 07/30/2007    PCP: Maurice Small, MD   REFERRING PROVIDER: Marguerita Beards, MD  REFERRING DIAG: N39.3 (ICD-10-CM) - SUI (stress urinary incontinence, female)  THERAPY DIAG:  Muscle weakness (generalized)  Unspecified lack of coordination  Rationale for Evaluation and Treatment Rehabilitation  ONSET DATE: October 2022 (after a long illness when I was coughing a lot)  SUBJECTIVE:                                                                                                                                                                                            SUBJECTIVE STATEMENT: I still have leakage when stepping funny and coughing/sneezing  Fluid intake: Yes: moderate amount     PAIN:  Are you having pain? No  PRECAUTIONS: None  WEIGHT BEARING RESTRICTIONS No  FALLS:  Has patient fallen in last 6 months? No  LIVING ENVIRONMENT: Lives  with: lives with their spouse Lives in: House/apartment  OCCUPATION: full time; school principal  PLOF: Independent  PATIENT GOALS  not have leakage  PERTINENT HISTORY:  abdominal hysterectomy 2006, breast cancer lumpectomy and radiation 2017, L5/S1 spondylolisthesis Sexual abuse: No  BOWEL MOVEMENT Pain with bowel movement: No   URINATION Pain with urination: No Fully empty bladder: Yes:   Stream: Strong Urgency: No Frequency: nocturia 2; can wait a long time during the day Leakage: Coughing, Sneezing, Exercise, and stepping up on the curb Pads: Yes: smallest poise pad to liner during the worst, but not currently  INTERCOURSE Pain with intercourse:  no   PREGNANCY Vaginal deliveries 1   PROLAPSE Urethrocele      OBJECTIVE:   DIAGNOSTIC FINDINGS:    PATIENT SURVEYS:    PFIQ-7 (do next)  COGNITION:  Overall cognitive status: Within functional limits for tasks assessed       MUSCLE LENGTH:   LUMBAR SPECIAL TESTS:    FUNCTIONAL TESTS:  Single leg stand - Rt initial LOB that was self corrected; Lt stable  GAIT:  Comments: WFL               POSTURE: rounded shoulders   PELVIC ALIGNMENT:  LUMBARAROM/PROM  A/PROM A/PROM  eval  Flexion   Extension   Right lateral flexion   Left lateral flexion   Right rotation   Left rotation    (Blank rows = not tested)  LOWER EXTREMITY ROM:  Passive ROM Right eval Left eval  Hip flexion    Hip extension    Hip abduction    Hip adduction    Hip  internal rotation    Hip external rotation    Knee flexion    Knee extension    Ankle dorsiflexion    Ankle plantarflexion    Ankle inversion    Ankle eversion     (Blank rows = not tested)  LOWER EXTREMITY MMT:  MMT Right eval Left eval  Hip flexion 5/5 5/5  Hip extension 5/5 5/5  Hip abduction 4/5 5/5  Hip adduction 5/5 5/5  Hip internal rotation 4/5 5/5  Hip external rotation 5/5 5/5  Knee flexion    Knee extension    Ankle dorsiflexion    Ankle plantarflexion    Ankle inversion    Ankle eversion      PALPATION:   General                  External Perineal Exam descended perineal body                             Internal Pelvic Floor Lt side fascial restrictions and tenderness all the way up the urethra to bladder; Rt side lower tone, decreased coordination unable to knack, using posterior pelvic floor more than anterior  Patient confirms identification and approves PT to assess internal pelvic floor and treatment Yes  PELVIC MMT:   MMT eval  Vaginal 3/5 x 12 sec - can do 10 quick flicks but elevates from the posterior pelvic floor a little more  Internal Anal Sphincter   External Anal Sphincter   Puborectalis   Diastasis Recti   (Blank rows = not tested)        TONE: Lower tone on the right side  PROLAPSE: Anterior wall  TODAY'S TREATMENT  Date:  HEP established-see below  Patient confirms identification and approves physical therapist to perform internal soft tissue work  Internal STM compressor urethra, left coccygeus and ileococcygeus Isolating pelvic floor, leaning forward on table staggered stance Squat with kettle bell and body weight squat with kegels    PATIENT EDUCATION:  Education details: Access Code: [email or web address] Person educated: Patient Education method: Explanation, Demonstration, Tactile cues, Verbal cues, and Handouts Education comprehension: verbalized understanding and returned demonstration   HOME EXERCISE PROGRAM: Access  Code: [email or web address] URL: [email or web address] Date: 02/19/2022 Prepared by: Dwana Curd  Exercises - Quadruped Exhale with Pelvic Floor Contraction  - 3 x daily - 7 x weekly - 1 sets - 10 reps - 3 hold - Mini Squat with Pelvic Floor Contraction  - 1 x daily - 7 x weekly - 2 sets - 10 reps  ASSESSMENT:  CLINICAL IMPRESSION: Patient responded well to fascial release.  It is more clear that she has had some sort of levator tear on the right side.  There is a lot of tension on the left vaginal wall that released with fascial techniques.  Pt was able to feel the pelvic floor engage anteriorly in positions using more hip hinging and quadruped position. She will benefit from skilled PT to address all mentioned impairments in order to restore maximum function without leakage   OBJECTIVE IMPAIRMENTS decreased coordination, decreased endurance, decreased strength, and pain.   ACTIVITY LIMITATIONS  coughing/sneezing and stepping off pattern  PARTICIPATION LIMITATIONS: interpersonal relationship and community activity  PERSONAL FACTORS 3+ comorbidities: abdominal hysterectomy 2006, breast cancer lumpectomy and radiation 2017, L5/S1 spondylolisthesis  are also affecting patient's functional outcome.   REHAB POTENTIAL: Excellent  CLINICAL DECISION MAKING: Evolving/moderate complexity  EVALUATION COMPLEXITY: Moderate   GOALS: Goals reviewed with patient? Yes  SHORT TERM GOALS: Target date: 02/12/2022  Ind with initial HEP Baseline: received today 02/19/22  Goal status: IN PROGRESS     LONG TERM GOALS: Target date: 04/09/2022   Pt able to knack with 4/5 MMT for coughing without leakage Baseline:  Goal status: IN PROGRESS  2.  Pt will be independent with advanced HEP to maintain improvements made throughout therapy  Baseline:  Goal status: INITIAL  3.  Pt will be able to functional actions such as change directions when walking without leakage  Baseline:  Goal  status: INITIAL  4.  Pt will be able to cough for 20 seconds if having a coughing fit without leakage or discomfort due to improved endurance of 20 sec pelvic floor hold Baseline:  Goal status: INITIAL  5.  Pt will report 50% reduction of pain with palpation around the bladder and urethra for reduced pain during intercourse  Baseline:  Goal status: INITIAL   PLAN: PT FREQUENCY: 1x/week  PT DURATION: 12 weeks  PLANNED INTERVENTIONS: Therapeutic exercises, Therapeutic activity, Neuromuscular re-education, Balance training, Gait training, Patient/Family education, Joint mobilization, Dry Needling, Electrical stimulation, Spinal mobilization, Cryotherapy, Moist heat, Taping, Biofeedback, and Manual therapy  PLAN FOR NEXT SESSION: f/u on exercises and kegel isolating; progress with single leg ex's; rotation, bands, half kneeling   [employer], PT 02/19/2022, 4:30 PM

## 2022-02-27 ENCOUNTER — Ambulatory Visit: Payer: Managed Care, Other (non HMO) | Admitting: Physical Therapy

## 2022-02-27 DIAGNOSIS — R279 Unspecified lack of coordination: Secondary | ICD-10-CM

## 2022-02-27 DIAGNOSIS — M6281 Muscle weakness (generalized): Secondary | ICD-10-CM | POA: Diagnosis not present

## 2022-02-27 NOTE — Therapy (Signed)
OUTPATIENT PHYSICAL THERAPY FEMALE PELVIC TREATMENT   Patient Name: [name] MRN: [id number] DOB:05/02/1966, 56 y.o., female Today's Date: 02/27/2022   PT End of Session - 02/27/22 1709     Visit Number 3    Date for PT Re-Evaluation 04/09/22    Authorization Type cigna    PT Start Time 1615    PT Stop Time 1654    PT Time Calculation (min) 39 min    Activity Tolerance Patient tolerated treatment well    Behavior During Therapy WFL for tasks assessed/performed               Past Medical History:  Diagnosis Date   Allergy    Breast mass, right    Complication of anesthesia    hard to wake up   GERD (gastroesophageal reflux disease)    Seizures (HCC)    as teen, no meds since age 14, no seizures   Past Surgical History:  Procedure Laterality Date   ABDOMINAL HYSTERECTOMY  2006   AP repair also   ABDOMINAL SACROCOLPOPEXY     with paravaginal repair   BREAST BIOPSY Right 08/31/2015   BREAST EXCISIONAL BIOPSY Right 09/23/2015   BREAST LUMPECTOMY WITH RADIOACTIVE SEED LOCALIZATION Right 09/23/2015   Procedure: BREAST LUMPECTOMY WITH RADIOACTIVE SEED LOCALIZATION;  Surgeon: Chevis Pretty III, MD;  Location: [facility];  Service: General;  Laterality: Right;   BUNIONECTOMY Bilateral 1986   EYE SURGERY     lasik   ROTATOR CUFF REPAIR Right 2012   URETER SURGERY     as child   Patient Active Problem List   Diagnosis Date Noted   Spondylolisthesis at L5-S1 level 03/16/2021   Plantar fasciitis of left foot 12/13/2020   METATARSALGIA 07/30/2007    PCP: Maurice Small, MD   REFERRING PROVIDER: Marguerita Beards, MD  REFERRING DIAG: N39.3 (ICD-10-CM) - SUI (stress urinary incontinence, female)  THERAPY DIAG:  Muscle weakness (generalized)  Unspecified lack of coordination  Rationale for Evaluation and Treatment Rehabilitation  ONSET DATE: October 2022 (after a long illness when I was coughing a lot)  SUBJECTIVE:                                                                                                                                                                                            SUBJECTIVE STATEMENT: I had some success with the sneezing and coughing and being able to brace without crossing my legs  Fluid intake: Yes: moderate amount     PAIN:  Are you having pain? No  PRECAUTIONS: None  WEIGHT BEARING RESTRICTIONS No  FALLS:  Has patient  fallen in last 6 months? No  LIVING ENVIRONMENT: Lives with: lives with their spouse Lives in: House/apartment  OCCUPATION: full time; school principal  PLOF: Independent  PATIENT GOALS  not have leakage  PERTINENT HISTORY:  abdominal hysterectomy 2006, breast cancer lumpectomy and radiation 2017, L5/S1 spondylolisthesis Sexual abuse: No  BOWEL MOVEMENT Pain with bowel movement: No   URINATION Pain with urination: No Fully empty bladder: Yes:   Stream: Strong Urgency: No Frequency: nocturia 2; can wait a long time during the day Leakage: Coughing, Sneezing, Exercise, and stepping up on the curb Pads: Yes: smallest poise pad to liner during the worst, but not currently  INTERCOURSE Pain with intercourse:  no   PREGNANCY Vaginal deliveries 1   PROLAPSE Urethrocele      OBJECTIVE:   DIAGNOSTIC FINDINGS:    PATIENT SURVEYS:    PFIQ-7 (do next)  COGNITION:  Overall cognitive status: Within functional limits for tasks assessed       MUSCLE LENGTH:   LUMBAR SPECIAL TESTS:    FUNCTIONAL TESTS:  Single leg stand - Rt initial LOB that was self corrected; Lt stable  GAIT:  Comments: WFL               POSTURE: rounded shoulders   PELVIC ALIGNMENT:  LUMBARAROM/PROM  A/PROM A/PROM  eval  Flexion   Extension   Right lateral flexion   Left lateral flexion   Right rotation   Left rotation    (Blank rows = not tested)  LOWER EXTREMITY ROM:  Passive ROM Right eval Left eval  Hip flexion    Hip extension     Hip abduction    Hip adduction    Hip internal rotation    Hip external rotation    Knee flexion    Knee extension    Ankle dorsiflexion    Ankle plantarflexion    Ankle inversion    Ankle eversion     (Blank rows = not tested)  LOWER EXTREMITY MMT:  MMT Right eval Left eval  Hip flexion 5/5 5/5  Hip extension 5/5 5/5  Hip abduction 4/5 5/5  Hip adduction 5/5 5/5  Hip internal rotation 4/5 5/5  Hip external rotation 5/5 5/5  Knee flexion    Knee extension    Ankle dorsiflexion    Ankle plantarflexion    Ankle inversion    Ankle eversion      PALPATION:   General                  External Perineal Exam descended perineal body                             Internal Pelvic Floor Lt side fascial restrictions and tenderness all the way up the urethra to bladder; Rt side lower tone, decreased coordination unable to knack, using posterior pelvic floor more than anterior  Patient confirms identification and approves PT to assess internal pelvic floor and treatment Yes  PELVIC MMT:   MMT eval  Vaginal 3/5 x 12 sec - can do 10 quick flicks but elevates from the posterior pelvic floor a little more  Internal Anal Sphincter   External Anal Sphincter   Puborectalis   Diastasis Recti   (Blank rows = not tested)        TONE: Lower tone on the right side  PROLAPSE: Anterior wall  TODAY'S TREATMENT  Date: 02/27/22 HEP updated Half kneel rotation red band Pallof  press red - pelvic floor engaged Quadruped hip ext Side lying hip adduction Standing wood pecker  Date: 02/19/22 HEP established-see below  Patient confirms identification and approves physical therapist to perform internal soft tissue work  Internal STM compressor urethra, left coccygeus and ileococcygeus Isolating pelvic floor, leaning forward on table staggered stance Squat with kettle bell and body weight squat with kegels    PATIENT EDUCATION:  Education details: Access Code: [email or web address] Person  educated: Patient Education method: Explanation, Demonstration, Tactile cues, Verbal cues, and Handouts Education comprehension: verbalized understanding and returned demonstration   HOME EXERCISE PROGRAM: Access Code: [email or web address] URL: [email or web address] Date: 02/27/2022 Prepared by: Dwana Curd  Exercises - Quadruped Exhale with Pelvic Floor Contraction  - 3 x daily - 7 x weekly - 1 sets - 10 reps - 3 hold - Mini Squat with Pelvic Floor Contraction  - 1 x daily - 7 x weekly - 2 sets - 10 reps - Quadruped on Forearms Bent Knee Hip Extension Alternating  - 1 x daily - 7 x weekly - 3 sets - 10 reps - Quadruped Thoracic Spine Extension  - 1 x daily - 7 x weekly - 3 sets - 10 reps - Modified Single-Leg Deadlift  - 1 x daily - 7 x weekly - 3 sets - 10 reps  ASSESSMENT:  CLINICAL IMPRESSION: Patient improved with coughing and sneezing.  She did well with exercise progressions today as seen above and updates made in her HEP.  She will benefit from skilled PT to address all mentioned impairments in order to restore maximum function without leakage   OBJECTIVE IMPAIRMENTS decreased coordination, decreased endurance, decreased strength, and pain.   ACTIVITY LIMITATIONS  coughing/sneezing and stepping off pattern  PARTICIPATION LIMITATIONS: interpersonal relationship and community activity  PERSONAL FACTORS 3+ comorbidities: abdominal hysterectomy 2006, breast cancer lumpectomy and radiation 2017, L5/S1 spondylolisthesis  are also affecting patient's functional outcome.   REHAB POTENTIAL: Excellent  CLINICAL DECISION MAKING: Evolving/moderate complexity  EVALUATION COMPLEXITY: Moderate   GOALS: Goals reviewed with patient? Yes  SHORT TERM GOALS: Target date: 02/12/2022  Ind with initial HEP Baseline: received today 02/19/22  Goal status: IN PROGRESS     LONG TERM GOALS: Target date: 04/09/2022   Pt able to knack with 4/5 MMT for coughing without  leakage Baseline:  Goal status: IN PROGRESS  2.  Pt will be independent with advanced HEP to maintain improvements made throughout therapy  Baseline:  Goal status: INITIAL  3.  Pt will be able to functional actions such as change directions when walking without leakage  Baseline:  Goal status: INITIAL  4.  Pt will be able to cough for 20 seconds if having a coughing fit without leakage or discomfort due to improved endurance of 20 sec pelvic floor hold Baseline:  Goal status: INITIAL  5.  Pt will report 50% reduction of pain with palpation around the bladder and urethra for reduced pain during intercourse  Baseline:  Goal status: INITIAL   PLAN: PT FREQUENCY: 1x/week  PT DURATION: 12 weeks  PLANNED INTERVENTIONS: Therapeutic exercises, Therapeutic activity, Neuromuscular re-education, Balance training, Gait training, Patient/Family education, Joint mobilization, Dry Needling, Electrical stimulation, Spinal mobilization, Cryotherapy, Moist heat, Taping, Biofeedback, and Manual therapy  PLAN FOR NEXT SESSION: f/u on exercises and kegel isolating; continue to progress with single leg ex's; rotation, bands, half kneeling; add a rotation exericse to HEP andf/u with using elvie doing squats   [employer], PT 02/27/2022, 5:10 PM

## 2022-03-06 ENCOUNTER — Encounter: Payer: Managed Care, Other (non HMO) | Admitting: Physical Therapy

## 2022-03-06 NOTE — Therapy (Unsigned)
OUTPATIENT PHYSICAL THERAPY FEMALE PELVIC TREATMENT   Patient Name: [name] MRN: [id number] DOB:1965-10-02, 56 y.o., female Today's Date: 03/06/2022       Past Medical History:  Diagnosis Date   Allergy    Breast mass, right    Complication of anesthesia    hard to wake up   GERD (gastroesophageal reflux disease)    Seizures ([place])    as teen, no meds since age 67, no seizures   Past Surgical History:  Procedure Laterality Date   ABDOMINAL HYSTERECTOMY  2006   AP repair also   ABDOMINAL SACROCOLPOPEXY     with paravaginal repair   BREAST BIOPSY Right 08/31/2015   BREAST EXCISIONAL BIOPSY Right 09/23/2015   BREAST LUMPECTOMY WITH RADIOACTIVE SEED LOCALIZATION Right 09/23/2015   Procedure: BREAST LUMPECTOMY WITH RADIOACTIVE SEED LOCALIZATION;  Surgeon: Autumn Messing III, MD;  Location: [place];  Service: General;  Laterality: Right;   BUNIONECTOMY Bilateral [place] Right 2012   URETER SURGERY     as child   Patient Active Problem List   Diagnosis Date Noted   Spondylolisthesis at L5-S1 level 03/16/2021   Plantar fasciitis of left foot 12/13/2020   METATARSALGIA 07/30/2007    PCP: Kelton Pillar, MD   REFERRING PROVIDER: Jaquita Folds, MD  REFERRING DIAG: N39.3 (ICD-10-CM) - SUI (stress urinary incontinence, female)  THERAPY DIAG:  No diagnosis found.  Rationale for Evaluation and Treatment Rehabilitation  ONSET DATE: October 2022 (after a long illness when I was coughing a lot)  SUBJECTIVE:                                                                                                                                                                                           SUBJECTIVE STATEMENT: I had very little to no leakage and was able to brace when coughing and sneezing and stepping quickly.  I was able to squat and keep the elvie in.  Fluid intake: Yes: moderate amount      PAIN:  Are you having pain? No  PRECAUTIONS: None  WEIGHT BEARING RESTRICTIONS No  FALLS:  Has patient fallen in last 6 months? No  LIVING ENVIRONMENT: Lives with: lives with their spouse Lives in: House/apartment  OCCUPATION: full time; school principal  PLOF: Independent  PATIENT GOALS  not have leakage  PERTINENT HISTORY:  abdominal hysterectomy 2006, breast cancer lumpectomy and radiation 2017, L5/S1 spondylolisthesis Sexual abuse: No  BOWEL MOVEMENT Pain with bowel movement: No   URINATION Pain with urination: No Fully empty bladder: Yes:  Stream: Strong Urgency: No Frequency: nocturia 2; can wait a long time during the day Leakage: Coughing, Sneezing, Exercise, and stepping up on the curb Pads: Yes: smallest poise pad to liner during the worst, but not currently  INTERCOURSE Pain with intercourse:  no   PREGNANCY Vaginal deliveries 1   PROLAPSE Urethrocele      OBJECTIVE:   DIAGNOSTIC FINDINGS:    PATIENT SURVEYS:    PFIQ-7 (do next)  COGNITION:  Overall cognitive status: Within functional limits for tasks assessed       MUSCLE LENGTH:   LUMBAR SPECIAL TESTS:    FUNCTIONAL TESTS:  Single leg stand - Rt initial LOB that was self corrected; Lt stable  GAIT:  Comments: WFL               POSTURE: rounded shoulders   PELVIC ALIGNMENT:  LUMBARAROM/PROM  A/PROM A/PROM  eval  Flexion   Extension   Right lateral flexion   Left lateral flexion   Right rotation   Left rotation    (Blank rows = not tested)  LOWER EXTREMITY ROM:  Passive ROM Right eval Left eval  Hip flexion    Hip extension    Hip abduction    Hip adduction    Hip internal rotation    Hip external rotation    Knee flexion    Knee extension    Ankle dorsiflexion    Ankle plantarflexion    Ankle inversion    Ankle eversion     (Blank rows = not tested)  LOWER EXTREMITY MMT:  MMT Right eval Left eval  Hip flexion 5/5 5/5  Hip  extension 5/5 5/5  Hip abduction 4/5 5/5  Hip adduction 5/5 5/5  Hip internal rotation 4/5 5/5  Hip external rotation 5/5 5/5  Knee flexion    Knee extension    Ankle dorsiflexion    Ankle plantarflexion    Ankle inversion    Ankle eversion      PALPATION:   General                  External Perineal Exam descended perineal body                             Internal Pelvic Floor Lt side fascial restrictions and tenderness all the way up the urethra to bladder; Rt side lower tone, decreased coordination unable to knack, using posterior pelvic floor more than anterior  Patient confirms identification and approves PT to assess internal pelvic floor and treatment Yes  PELVIC MMT:   MMT eval  Vaginal 3/5 x 12 sec - can do 10 quick flicks but elevates from the posterior pelvic floor a little more  Internal Anal Sphincter   External Anal Sphincter   Puborectalis   Diastasis Recti   (Blank rows = not tested)        TONE: Lower tone on the right side  PROLAPSE: Anterior wall  TODAY'S TREATMENT  Date: 03/07/22 Side step green band for anti-rotation - 10 x each side Side step down - 10x each side Knee/hip flexion press out from step 10x each side Side step on BOSU - 10x  Lunge with back foot on bosu - support of 1 UE - 10x each side Half kneel green band rotation - 10x each side Adductoin and diagonal back - blue band - 2x10 Hopping on rebounder Single leg hopping side to side  Date: 02/27/22 HEP updated  Half kneel rotation red band Pallof press red - pelvic floor engaged Quadruped hip ext Side lying hip adduction Standing wood pecker  Date: 02/19/22 HEP established-see below  Patient confirms identification and approves physical therapist to perform internal soft tissue work  Internal STM compressor urethra, left coccygeus and ileococcygeus Isolating pelvic floor, leaning forward on table staggered stance Squat with kettle bell and body weight squat with  kegels    PATIENT EDUCATION:  Education details: Access Code: [email or web address] Person educated: Patient Education method: Explanation, Demonstration, Tactile cues, Verbal cues, and Handouts Education comprehension: verbalized understanding and returned demonstration   HOME EXERCISE PROGRAM: Access Code: [email or web address] URL: [email or web address] Date: 02/27/2022 Prepared by: Jari Favre  Exercises - Quadruped Exhale with Pelvic Floor Contraction  - 3 x daily - 7 x weekly - 1 sets - 10 reps - 3 hold - Mini Squat with Pelvic Floor Contraction  - 1 x daily - 7 x weekly - 2 sets - 10 reps - Quadruped on Forearms Bent Knee Hip Extension Alternating  - 1 x daily - 7 x weekly - 3 sets - 10 reps - Quadruped Thoracic Spine Extension  - 1 x daily - 7 x weekly - 3 sets - 10 reps - Modified Single-Leg Deadlift  - 1 x daily - 7 x weekly - 3 sets - 10 reps  ASSESSMENT:  CLINICAL IMPRESSION: Patient improved with coughing and sneezing and has in addition been able to step in different directions without leakage.  Today's session focused on more challenging exercises where she began to work on hopping and was educated how to progress this at home. She will return in 2 weeks to progress strength program more.  She did well with exercise progressions today as seen above and updates made in her HEP.  She will benefit from skilled PT to address all mentioned impairments in order to restore maximum function without leakage   OBJECTIVE IMPAIRMENTS decreased coordination, decreased endurance, decreased strength, and pain.   ACTIVITY LIMITATIONS  coughing/sneezing and stepping off pattern  PARTICIPATION LIMITATIONS: interpersonal relationship and community activity  PERSONAL FACTORS 3+ comorbidities: abdominal hysterectomy 2006, breast cancer lumpectomy and radiation 2017, L5/S1 spondylolisthesis  are also affecting patient's functional outcome.   REHAB POTENTIAL: Excellent  CLINICAL DECISION  MAKING: Evolving/moderate complexity  EVALUATION COMPLEXITY: Moderate   GOALS: Goals reviewed with patient? Yes  SHORT TERM GOALS: Target date: 02/12/2022  Ind with initial HEP Baseline: received today 02/19/22  Goal status: IN PROGRESS     LONG TERM GOALS: Target date: 04/09/2022   Pt able to knack with 4/5 MMT for coughing without leakage Baseline:  Goal status: IN PROGRESS  2.  Pt will be independent with advanced HEP to maintain improvements made throughout therapy  Baseline:  Goal status: INITIAL  3.  Pt will be able to functional actions such as change directions when walking without leakage  Baseline:  Goal status: MET  4.  Pt will be able to cough for 20 seconds if having a coughing fit without leakage or discomfort due to improved endurance of 20 sec pelvic floor hold Baseline:  Goal status: INITIAL  5.  Pt will report 50% reduction of pain with palpation around the bladder and urethra for reduced pain during intercourse  Baseline:  Goal status: MET   PLAN: PT FREQUENCY: 1x/week  PT DURATION: 12 weeks  PLANNED INTERVENTIONS: Therapeutic exercises, Therapeutic activity, Neuromuscular re-education, Balance training, Gait training, Patient/Family education, Joint mobilization, Dry Needling, Electrical stimulation, Spinal mobilization, Cryotherapy, Moist heat,  Taping, Biofeedback, and Manual therapy  PLAN FOR NEXT SESSION: f/u on hopping, modified jumping jack, rotation and half kneel progress   Jule Ser, PT 03/06/2022, 5:42 PM

## 2022-03-07 ENCOUNTER — Ambulatory Visit: Payer: Managed Care, Other (non HMO) | Admitting: Physical Therapy

## 2022-03-07 ENCOUNTER — Encounter: Payer: Self-pay | Admitting: Physical Therapy

## 2022-03-07 DIAGNOSIS — M6281 Muscle weakness (generalized): Secondary | ICD-10-CM

## 2022-03-07 DIAGNOSIS — R279 Unspecified lack of coordination: Secondary | ICD-10-CM

## 2022-03-15 ENCOUNTER — Encounter: Payer: Managed Care, Other (non HMO) | Admitting: Physical Therapy

## 2022-03-20 ENCOUNTER — Ambulatory Visit: Payer: Managed Care, Other (non HMO) | Attending: Obstetrics and Gynecology | Admitting: Physical Therapy

## 2022-03-20 DIAGNOSIS — R279 Unspecified lack of coordination: Secondary | ICD-10-CM | POA: Insufficient documentation

## 2022-03-20 DIAGNOSIS — M6281 Muscle weakness (generalized): Secondary | ICD-10-CM | POA: Diagnosis present

## 2022-03-20 NOTE — Therapy (Signed)
OUTPATIENT PHYSICAL THERAPY FEMALE PELVIC TREATMENT   Patient Name: [name] MRN: [id number] DOB:04-16-1966, 56 y.o., female Today's Date: 03/20/2022   PT End of Session - 03/20/22 1451     Visit Number 5    Date for PT Re-Evaluation 04/09/22    Authorization Type cigna    PT Start Time 1451    PT Stop Time 4627    PT Time Calculation (min) 39 min    Activity Tolerance Patient tolerated treatment well    Behavior During Therapy WFL for tasks assessed/performed                Past Medical History:  Diagnosis Date   Allergy    Breast mass, right    Complication of anesthesia    hard to wake up   GERD (gastroesophageal reflux disease)    Seizures ([place])    as teen, no meds since age 29, no seizures   Past Surgical History:  Procedure Laterality Date   ABDOMINAL HYSTERECTOMY  2006   AP repair also   ABDOMINAL SACROCOLPOPEXY     with paravaginal repair   BREAST BIOPSY Right 08/31/2015   BREAST EXCISIONAL BIOPSY Right 09/23/2015   BREAST LUMPECTOMY WITH RADIOACTIVE SEED LOCALIZATION Right 09/23/2015   Procedure: BREAST LUMPECTOMY WITH RADIOACTIVE SEED LOCALIZATION;  Surgeon: Autumn Messing III, MD;  Location: [place];  Service: General;  Laterality: Right;   BUNIONECTOMY Bilateral [place] Right 2012   URETER SURGERY     as child   Patient Active Problem List   Diagnosis Date Noted   Spondylolisthesis at L5-S1 level 03/16/2021   Plantar fasciitis of left foot 12/13/2020   METATARSALGIA 07/30/2007    PCP: Kelton Pillar, MD   REFERRING PROVIDER: Jaquita Folds, MD  REFERRING DIAG: N39.3 (ICD-10-CM) - SUI (stress urinary incontinence, female)  THERAPY DIAG:  Muscle weakness (generalized)  Unspecified lack of coordination  Rationale for Evaluation and Treatment Rehabilitation  ONSET DATE: October 2022 (after a long illness when I was coughing a lot)  SUBJECTIVE:                                                                                                                                                                                            SUBJECTIVE STATEMENT: I have been having some sneezing and no leakage.  I was able to do jump squats no leakage - about 6 reps  Fluid intake: Yes: moderate amount     PAIN:  Are you having pain? No  PRECAUTIONS: None  WEIGHT BEARING RESTRICTIONS  No  FALLS:  Has patient fallen in last 6 months? No  LIVING ENVIRONMENT: Lives with: lives with their spouse Lives in: House/apartment  OCCUPATION: full time; school principal  PLOF: Independent  PATIENT GOALS  not have leakage  PERTINENT HISTORY:  abdominal hysterectomy 2006, breast cancer lumpectomy and radiation 2017, L5/S1 spondylolisthesis Sexual abuse: No  BOWEL MOVEMENT Pain with bowel movement: No   URINATION Pain with urination: No Fully empty bladder: Yes:   Stream: Strong Urgency: No Frequency: nocturia 2; can wait a long time during the day Leakage: Coughing, Sneezing, Exercise, and stepping up on the curb Pads: Yes: smallest poise pad to liner during the worst, but not currently  INTERCOURSE Pain with intercourse:  no   PREGNANCY Vaginal deliveries 1   PROLAPSE Urethrocele      OBJECTIVE:   DIAGNOSTIC FINDINGS:    PATIENT SURVEYS:    PFIQ-7 (do next)  COGNITION:  Overall cognitive status: Within functional limits for tasks assessed       MUSCLE LENGTH:   LUMBAR SPECIAL TESTS:    FUNCTIONAL TESTS:  Single leg stand - Rt initial LOB that was self corrected; Lt stable  GAIT:  Comments: WFL               POSTURE: rounded shoulders   PELVIC ALIGNMENT:  LUMBARAROM/PROM  A/PROM A/PROM  eval  Flexion   Extension   Right lateral flexion   Left lateral flexion   Right rotation   Left rotation    (Blank rows = not tested)  LOWER EXTREMITY ROM:  Passive ROM Right eval Left eval  Hip flexion    Hip  extension    Hip abduction    Hip adduction    Hip internal rotation    Hip external rotation    Knee flexion    Knee extension    Ankle dorsiflexion    Ankle plantarflexion    Ankle inversion    Ankle eversion     (Blank rows = not tested)  LOWER EXTREMITY MMT:  MMT Right eval Left eval  Hip flexion 5/5 5/5  Hip extension 5/5 5/5  Hip abduction 4/5 5/5  Hip adduction 5/5 5/5  Hip internal rotation 4/5 5/5  Hip external rotation 5/5 5/5  Knee flexion    Knee extension    Ankle dorsiflexion    Ankle plantarflexion    Ankle inversion    Ankle eversion      PALPATION:   General                  External Perineal Exam descended perineal body                             Internal Pelvic Floor Lt side fascial restrictions and tenderness all the way up the urethra to bladder; Rt side lower tone, decreased coordination unable to knack, using posterior pelvic floor more than anterior  Patient confirms identification and approves PT to assess internal pelvic floor and treatment Yes  PELVIC MMT:   MMT eval  Vaginal 3/5 x 12 sec - can do 10 quick flicks but elevates from the posterior pelvic floor a little more  Internal Anal Sphincter   External Anal Sphincter   Puborectalis   Diastasis Recti   (Blank rows = not tested)        TONE: Lower tone on the right side  PROLAPSE: Anterior wall  TODAY'S TREATMENT  Date: 03/20/22 Side step  green loops - 15x each side Fwd/back green loop - 15x each side Tall kneel - ball raises - 20x Tall kneel from thunderbolt - shoulder press 4lb - 15 x Side step green band for anti-rotation - 10 x each side Knee/hip flexion press out from BOSU 10x each side Side step on BOSU - 10x  Half kneel blue band rotation - 20x each side Adductoin and diagonal back - blue band - 2x10 Hopping on rebounder - 3 x 1 min Squat jump lateral 2 to each side Toe tap quick - 3 x 20 sec Wedge pillow - butterfly, piriformis, knee to chest  Date:  03/07/22 Side step green band for anti-rotation - 10 x each side Side step down - 10x each side Knee/hip flexion press out from step 10x each side Side step on BOSU - 10x  Lunge with back foot on bosu - support of 1 UE - 10x each side Half kneel green band rotation - 10x each side Adductoin and diagonal back - blue band - 2x10 Hopping on rebounder Single leg hopping side to side  Date: 02/27/22 HEP updated Half kneel rotation red band Pallof press red - pelvic floor engaged Quadruped hip ext Side lying hip adduction Standing wood pecker  Date: 02/19/22 HEP established-see below  Patient confirms identification and approves physical therapist to perform internal soft tissue work  Internal STM compressor urethra, left coccygeus and ileococcygeus Isolating pelvic floor, leaning forward on table staggered stance Squat with kettle bell and body weight squat with kegels    PATIENT EDUCATION:  Education details: Access Code: [email or web address] Person educated: Patient Education method: Explanation, Demonstration, Corporate treasurer cues, Verbal cues, and Handouts Education comprehension: verbalized understanding and returned demonstration   HOME EXERCISE PROGRAM: Access Code: [email or web address] URL: [email or web address] Date: 02/27/2022 Prepared by: Jari Favre  Exercises - Quadruped Exhale with Pelvic Floor Contraction  - 3 x daily - 7 x weekly - 1 sets - 10 reps - 3 hold - Mini Squat with Pelvic Floor Contraction  - 1 x daily - 7 x weekly - 2 sets - 10 reps - Quadruped on Forearms Bent Knee Hip Extension Alternating  - 1 x daily - 7 x weekly - 3 sets - 10 reps - Quadruped Thoracic Spine Extension  - 1 x daily - 7 x weekly - 3 sets - 10 reps - Modified Single-Leg Deadlift  - 1 x daily - 7 x weekly - 3 sets - 10 reps  ASSESSMENT:  CLINICAL IMPRESSION: Patient demonstrates improvements. She is able to increase jumping and hopping time and intensity.  Pt was given appointment for two  weeks as she is doing better with exercises on her own. She is expected to continue to progress with strength and endurance and be able to work more on her own this week.  Pt is recommended to continue skilled therapy for full return to prior level of exercise without leakage.   OBJECTIVE IMPAIRMENTS decreased coordination, decreased endurance, decreased strength, and pain.   ACTIVITY LIMITATIONS  coughing/sneezing and stepping off pattern  PARTICIPATION LIMITATIONS: interpersonal relationship and community activity  PERSONAL FACTORS 3+ comorbidities: abdominal hysterectomy 2006, breast cancer lumpectomy and radiation 2017, L5/S1 spondylolisthesis  are also affecting patient's functional outcome.   REHAB POTENTIAL: Excellent  CLINICAL DECISION MAKING: Evolving/moderate complexity  EVALUATION COMPLEXITY: Moderate   GOALS: Goals reviewed with patient? Yes  SHORT TERM GOALS: Target date: 02/12/2022  Ind with initial HEP Baseline: received today 02/19/22  Goal status: MET     LONG  TERM GOALS: Target date: 04/09/2022 ; updated 03/20/22   Pt able to knack with 4/5 MMT for coughing without leakage Baseline:  Goal status: IN PROGRESS  2.  Pt will be independent with advanced HEP to maintain improvements made throughout therapy  Baseline:  Goal status: IN PROGRESS  3.  Pt will be able to functional actions such as change directions when walking without leakage  Baseline:  Goal status: MET  4.  Pt will be able to cough for 20 seconds if having a coughing fit without leakage or discomfort due to improved endurance of 20 sec pelvic floor hold Baseline: better Goal status: IN PROGRESS  5.  Pt will report 50% reduction of pain with palpation around the bladder and urethra for reduced pain during intercourse  Baseline:  Goal status: MET   PLAN: PT FREQUENCY: 1x/week  PT DURATION: 12 weeks  PLANNED INTERVENTIONS: Therapeutic exercises, Therapeutic activity, Neuromuscular  re-education, Balance training, Gait training, Patient/Family education, Joint mobilization, Dry Needling, Electrical stimulation, Spinal mobilization, Cryotherapy, Moist heat, Taping, Biofeedback, and Manual therapy  PLAN FOR NEXT SESSION: f/u on hopping, modified jumping jack, rotation and half kneel progress; possibly d/c - update goals   Jule Ser, PT 03/20/2022, 2:51 PM

## 2022-03-27 ENCOUNTER — Ambulatory Visit: Payer: Managed Care, Other (non HMO) | Admitting: Physical Therapy

## 2022-04-02 NOTE — Therapy (Unsigned)
OUTPATIENT PHYSICAL THERAPY FEMALE PELVIC TREATMENT   Patient Name: [name] MRN: [id number] DOB:09/16/1965, 56 y.o.,, female Today's Date: 04/03/2022   PT End of Session - 04/03/22 1622     Visit Number 6    Date for PT Re-Evaluation 04/09/22    Authorization Type cigna    PT Start Time 1620    PT Stop Time 1700    PT Time Calculation (min) 40 min    Activity Tolerance Patient tolerated treatment well    Behavior During Therapy WFL for tasks assessed/performed                 Past Medical History:  Diagnosis Date   Allergy    Breast mass, right    Complication of anesthesia    hard to wake up   GERD (gastroesophageal reflux disease)    Seizures ([place])    as teen, no meds since age 4, no seizures   Past Surgical History:  Procedure Laterality Date   ABDOMINAL HYSTERECTOMY  2006   AP repair also   ABDOMINAL SACROCOLPOPEXY     with paravaginal repair   BREAST BIOPSY Right 08/31/2015   BREAST EXCISIONAL BIOPSY Right 09/23/2015   BREAST LUMPECTOMY WITH RADIOACTIVE SEED LOCALIZATION Right 09/23/2015   Procedure: BREAST LUMPECTOMY WITH RADIOACTIVE SEED LOCALIZATION;  Surgeon: Autumn Messing III, MD;  Location: [facility];  Service: General;  Laterality: Right;   BUNIONECTOMY Bilateral [place] Right 2012   URETER SURGERY     as child   Patient Active Problem List   Diagnosis Date Noted   Spondylolisthesis at L5-S1 level 03/16/2021   Plantar fasciitis of left foot 12/13/2020   METATARSALGIA 07/30/2007    PCP: Kelton Pillar, MD   REFERRING PROVIDER: Jaquita Folds, MD  REFERRING DIAG: N39.3 (ICD-10-CM) - SUI (stress urinary incontinence, female)  THERAPY DIAG:  Muscle weakness (generalized)  Unspecified lack of coordination  Rationale for Evaluation and Treatment Rehabilitation  ONSET DATE: October 2022 (after a long illness when I was coughing a lot)  SUBJECTIVE:                                                                                                                                                                                            SUBJECTIVE STATEMENT: I had one very big sneeze and leaked a little but overall no leakage.  Doing the exercises from last time and doing well with that.  Fluid intake: Yes: moderate amount     PAIN:  Are you having pain? No  PRECAUTIONS: None  WEIGHT BEARING RESTRICTIONS No  FALLS:  Has patient fallen in last 6 months? No  LIVING ENVIRONMENT: Lives with: lives with their spouse Lives in: House/apartment  OCCUPATION: full time; school principal  PLOF: Independent  PATIENT GOALS  not have leakage  PERTINENT HISTORY:  abdominal hysterectomy 2006, breast cancer lumpectomy and radiation 2017, L5/S1 spondylolisthesis Sexual abuse: No  BOWEL MOVEMENT Pain with bowel movement: No   URINATION Pain with urination: No Fully empty bladder: Yes:   Stream: Strong Urgency: No Frequency: nocturia 2; can wait a long time during the day Leakage: Coughing, Sneezing, Exercise, and stepping up on the curb Pads: Yes: smallest poise pad to liner during the worst, but not currently  INTERCOURSE Pain with intercourse:  no   PREGNANCY Vaginal deliveries 1   PROLAPSE Urethrocele      OBJECTIVE:   DIAGNOSTIC FINDINGS:    PATIENT SURVEYS:    PFIQ-7 (do next)  COGNITION:  Overall cognitive status: Within functional limits for tasks assessed       MUSCLE LENGTH:   LUMBAR SPECIAL TESTS:    FUNCTIONAL TESTS:  Single leg stand - Rt initial LOB that was self corrected; Lt stable  GAIT:  Comments: WFL               POSTURE: rounded shoulders   PELVIC ALIGNMENT:  LUMBARAROM/PROM  A/PROM A/PROM  eval  Flexion   Extension   Right lateral flexion   Left lateral flexion   Right rotation   Left rotation    (Blank rows = not tested)  LOWER EXTREMITY ROM:  Passive ROM Right eval Left eval   Hip flexion    Hip extension    Hip abduction    Hip adduction    Hip internal rotation    Hip external rotation    Knee flexion    Knee extension    Ankle dorsiflexion    Ankle plantarflexion    Ankle inversion    Ankle eversion     (Blank rows = not tested)  LOWER EXTREMITY MMT:  MMT Right eval Left eval  Hip flexion 5/5 5/5  Hip extension 5/5 5/5  Hip abduction 4/5 5/5  Hip adduction 5/5 5/5  Hip internal rotation 4/5 5/5  Hip external rotation 5/5 5/5  Knee flexion    Knee extension    Ankle dorsiflexion    Ankle plantarflexion    Ankle inversion    Ankle eversion      PALPATION:   General                  External Perineal Exam descended perineal body                             Internal Pelvic Floor Lt side fascial restrictions and tenderness all the way up the urethra to bladder; Rt side lower tone, decreased coordination unable to knack, using posterior pelvic floor more than anterior  Patient confirms identification and approves PT to assess internal pelvic floor and treatment Yes  PELVIC MMT:   MMT eval  Vaginal 3/5 x 12 sec - can do 10 quick flicks but elevates from the posterior pelvic floor a little more  Internal Anal Sphincter   External Anal Sphincter   Puborectalis   Diastasis Recti   (Blank rows = not tested)        TONE: Lower tone on the right side  PROLAPSE: Anterior wall  TODAY'S TREATMENT  Date:  04/03/22 Eliptical 2/2 fwd/back BOSU foot fwd and rotation - 5lb x5 then switch to body weight Single leg stand with rotation Side step with pallof press 5lb - power tower - 10x bil 5lb hold at chest - marching - 20x Side step BOSU with rotation Half kneel 5lb chop Standing glute med activated - yellow ball lift - 10xeach side Calf raise "jumps" Mini Bouncing on trampoline - 2x 1 min  Date: 03/20/22 Side step green loops - 15x each side Fwd/back green loop - 15x each side Tall kneel - ball raises - 20x Tall kneel from thunderbolt  - shoulder press 4lb - 15 x Side step green band for anti-rotation - 10 x each side Knee/hip flexion press out from BOSU 10x each side Side step on BOSU - 10x  Half kneel blue band rotation - 20x each side Adductoin and diagonal back - blue band - 2x10 Hopping on rebounder - 3 x 1 min Squat jump lateral 2 to each side Toe tap quick - 3 x 20 sec Wedge pillow - butterfly, piriformis, knee to chest  Date: 03/07/22 Side step green band for anti-rotation - 10 x each side Side step down - 10x each side Knee/hip flexion press out from step 10x each side Side step on BOSU - 10x  Lunge with back foot on bosu - support of 1 UE - 10x each side Half kneel green band rotation - 10x each side Adductoin and diagonal back - blue band - 2x10 Hopping on rebounder Single leg hopping side to side  Date: 02/27/22 HEP updated Half kneel rotation red band Pallof press red - pelvic floor engaged Quadruped hip ext Side lying hip adduction Standing wood pecker      PATIENT EDUCATION:  Education details: Access Code: [email or web address] Person educated: Patient Education method: Consulting civil engineer, Demonstration, Corporate treasurer cues, Verbal cues, and Handouts Education comprehension: verbalized understanding and returned demonstration   HOME EXERCISE PROGRAM: Access Code: [email or web address] URL: [email or web address] Date: 02/27/2022 Prepared by: Jari Favre  Exercises - Quadruped Exhale with Pelvic Floor Contraction  - 3 x daily - 7 x weekly - 1 sets - 10 reps - 3 hold - Mini Squat with Pelvic Floor Contraction  - 1 x daily - 7 x weekly - 2 sets - 10 reps - Quadruped on Forearms Bent Knee Hip Extension Alternating  - 1 x daily - 7 x weekly - 3 sets - 10 reps - Quadruped Thoracic Spine Extension  - 1 x daily - 7 x weekly - 3 sets - 10 reps - Modified Single-Leg Deadlift  - 1 x daily - 7 x weekly - 3 sets - 10 reps  ASSESSMENT:  CLINICAL IMPRESSION: Patient has met all goals.  She is independent with  HEP and able to safely progress.  Pt will d/c with HEP today.   OBJECTIVE IMPAIRMENTS decreased coordination, decreased endurance, decreased strength, and pain.   ACTIVITY LIMITATIONS  coughing/sneezing and stepping off pattern  PARTICIPATION LIMITATIONS: interpersonal relationship and community activity  PERSONAL FACTORS 3+ comorbidities: abdominal hysterectomy 2006, breast cancer lumpectomy and radiation 2017, L5/S1 spondylolisthesis  are also affecting patient's functional outcome.   REHAB POTENTIAL: Excellent  CLINICAL DECISION MAKING: Evolving/moderate complexity  EVALUATION COMPLEXITY: Moderate   GOALS: Goals reviewed with patient? Yes  SHORT TERM GOALS: Target date: 02/12/2022  Ind with initial HEP Baseline: received today 02/19/22  Goal status: MET     LONG TERM GOALS: Target date: 04/09/2022 ; updated 04/03/22   Pt able to knack with 4/5 MMT  for coughing without leakage Baseline: no leakage with coughing Goal status: MET  2.  Pt will be independent with advanced HEP to maintain improvements made throughout therapy  Baseline:  Goal status: MET  3.  Pt will be able to functional actions such as change directions when walking without leakage  Baseline:  Goal status: MET  4.  Pt will be able to cough for 20 seconds if having a coughing fit without leakage or discomfort due to improved endurance of 20 sec pelvic floor hold Baseline: better Goal status: MET  5.  Pt will report 50% reduction of pain with palpation around the bladder and urethra for reduced pain during intercourse  Baseline:  Goal status: MET   PLAN: PT FREQUENCY: 1x/week  PT DURATION: 12 weeks  PLANNED INTERVENTIONS: Therapeutic exercises, Therapeutic activity, Neuromuscular re-education, Balance training, Gait training, Patient/Family education, Joint mobilization, Dry Needling, Electrical stimulation, Spinal mobilization, Cryotherapy, Moist heat, Taping, Biofeedback, and Manual  therapy  PLAN FOR NEXT SESSION: d/c today   Jule Ser, PT 04/03/2022, 5:13 PM  PHYSICAL THERAPY DISCHARGE SUMMARY  Visits from Start of Care: 6  Current functional level related to goals / functional outcomes: See above goals met   Remaining deficits: See above details   Education / Equipment: HEP   Patient agrees to discharge. Patient goals were met. Patient is being discharged due to being pleased with the current functional level.  Gustavus Bryant, PT 04/03/22 5:14 PM

## 2022-04-03 ENCOUNTER — Encounter: Payer: Self-pay | Admitting: Physical Therapy

## 2022-04-03 ENCOUNTER — Ambulatory Visit: Payer: Managed Care, Other (non HMO) | Admitting: Physical Therapy

## 2022-04-03 DIAGNOSIS — M6281 Muscle weakness (generalized): Secondary | ICD-10-CM | POA: Diagnosis not present

## 2022-04-03 DIAGNOSIS — R279 Unspecified lack of coordination: Secondary | ICD-10-CM

## 2022-04-09 ENCOUNTER — Encounter: Payer: Managed Care, Other (non HMO) | Admitting: Physical Therapy

## 2022-07-19 ENCOUNTER — Other Ambulatory Visit: Payer: Self-pay | Admitting: Family Medicine

## 2022-07-19 DIAGNOSIS — Z1231 Encounter for screening mammogram for malignant neoplasm of breast: Secondary | ICD-10-CM

## 2022-08-15 ENCOUNTER — Encounter: Payer: Self-pay | Admitting: Neurology

## 2022-08-15 ENCOUNTER — Ambulatory Visit: Payer: Managed Care, Other (non HMO) | Admitting: Neurology

## 2022-08-15 ENCOUNTER — Telehealth: Payer: Self-pay | Admitting: Neurology

## 2022-08-15 VITALS — BP 134/81 | HR 87 | Ht 66.0 in | Wt 145.0 lb

## 2022-08-15 DIAGNOSIS — G43109 Migraine with aura, not intractable, without status migrainosus: Secondary | ICD-10-CM | POA: Diagnosis not present

## 2022-08-15 DIAGNOSIS — Z87898 Personal history of other specified conditions: Secondary | ICD-10-CM

## 2022-08-15 DIAGNOSIS — G43009 Migraine without aura, not intractable, without status migrainosus: Secondary | ICD-10-CM

## 2022-08-15 NOTE — Patient Instructions (Signed)
Continue with sumatriptan 100 mg as needed for headache, can combine it with Aleve, up to 500 mg MRI brain with and without contrast Follow-up in 1 year or sooner if worse.

## 2022-08-15 NOTE — Progress Notes (Signed)
[facility]  PATIENT: [name] DOB: [birth year]  REQUESTING CLINICIAN: Lois Huxley, PA HISTORY FROM: Patient  REASON FOR VISIT: Migraines and vision changes    HISTORICAL  CHIEF COMPLAINT:  Chief Complaint  Patient presents with   New Patient (Initial Visit)    Rm 13 alone. Pt reports she is here to discuss intermittent visual disturbance and lightheadedness. Pt reports light stimulation brings on these events. Symptoms will last for 1 minute or so.   Hx of epilepsy and migraines.     HISTORY OF PRESENT ILLNESS:  This is a 57 year old woman with past medical history of insomnia, headaches, history of seizures resolved, who is presenting to establish care.  In terms of the headaches, patient reported being diagnosed with migraine for the past 6 years, she will have right-sided headache initially was responding to over-the-counter but in the past 2 years required sumatriptan 100 mg.  Again these headaches happen once or twice a year. No associated nausea or vomiting and the Sumatriptan works very well.  During this time also she has been complaining of vision change.  She described her vision changes as stroke of lights, she feels lightheaded and cannot focus, initially she thought it was due to low blood sugar, she will eat and drink and this will go away within a minute or 2.  She also described shimmering of light that lasted about 20 minutes without any headaches she reported 2 episodes in the past 6 years   History of epilepsy, diagnosed at the age of 1, had 2 syncopal episodes, and a nabnormal eeg, was on phenobarb from the age of 75 to 74. Since then, she is off phenobarb and no seizures or seizures like events.   OTHER MEDICAL CONDITIONS: Headaches, Insomnia    REVIEW OF SYSTEMS: Full 14 system review of systems performed and negative with exception of: As noted in the HPI   ALLERGIES: Allergies  Allergen Reactions   Bee Venom Anaphylaxis    Keflex [Cephalexin]    Sulfa Antibiotics Hives   Latex Rash    HOME MEDICATIONS: Outpatient Medications Prior to Visit  Medication Sig Dispense Refill   acetaminophen (TYLENOL) 500 MG tablet Take 1,000 mg by mouth every 6 (six) hours as needed (for pain.).      estradiol (ESTRACE) 0.1 MG/GM vaginal cream Place 0.5 g vaginally 2 (two) times a week. Place 0.5g nightly for two weeks then twice a week after 30 g 11   ibuprofen (ADVIL,MOTRIN) 200 MG tablet Take 200 mg by mouth every 6 (six) hours as needed (for pain.).     Ketotifen Fumarate (ALLERGY EYE DROPS OP) Apply to eye.     loratadine (CLARITIN) 10 MG tablet Take 10 mg by mouth daily.     Melatonin 3 MG CAPS Take 3 capsules by mouth continuous as needed (as needed per patient).     NON FORMULARY Inject 1 Syringe into the muscle every 30 (thirty) days. Allergy Shot received from [facility]     SUMAtriptan (IMITREX) 100 MG tablet Take 100 mg by mouth as directed.     VITAMIN D PO Take 5,000 Units by mouth.     zolpidem (AMBIEN) 5 MG tablet Take 5 mg by mouth at bedtime as needed for sleep.     Multiple Vitamins-Minerals (AIRBORNE PO) Take 1 tablet by mouth 3 (three) times daily as needed (for immune health).     Multiple Vitamins-Minerals (EMERGEN-C IMMUNE PLUS PO) Take 1 packet by mouth  2 (two) times daily as needed.      Multiple Vitamins-Minerals (MULTIVITAMIN WITH MINERALS) tablet Take 1 tablet by mouth daily.     cycloSPORINE (RESTASIS) 0.05 % ophthalmic emulsion 1 drop 2 (two) times daily.     diazepam (VALIUM) 5 MG tablet Take 1 tablet (5 mg) by mouth 1 hour prior to the MRI. 1 tablet 0   Flaxseed, Linseed, (FLAX SEED OIL PO) Take 1 capsule by mouth daily.      gabapentin (NEURONTIN) 300 MG capsule Take 1 capsule (300 mg total) by mouth at bedtime. (Patient not taking: Reported on 04/19/2021) 30 capsule 0   methylPREDNISolone (MEDROL DOSEPAK) 4 MG TBPK tablet Take as directed 21 tablet 0   omeprazole (PRILOSEC) 20 MG  capsule Take 20 mg by mouth daily.     oxyCODONE-acetaminophen (ROXICET) 5-325 MG tablet Take 1-2 tablets by mouth every 4 (four) hours as needed. 50 tablet 0   tretinoin (RETIN-A) 0.01 % gel Apply 1 application topically 2 (two) times a week.      triamcinolone (KENALOG) 0.1 % paste Use as directed 1 application in the mouth or throat 2 (two) times daily as needed. For ulcers  1   No facility-administered medications prior to visit.    PAST MEDICAL HISTORY: Past Medical History:  Diagnosis Date   Allergy    Breast mass, right    Complication of anesthesia    hard to wake up   GERD (gastroesophageal reflux disease)    Seizures ([place])    as teen, no meds since age 27, no seizures    PAST SURGICAL HISTORY: Past Surgical History:  Procedure Laterality Date   ABDOMINAL HYSTERECTOMY  2006   AP repair also   ABDOMINAL SACROCOLPOPEXY     with paravaginal repair   BREAST BIOPSY Right 08/31/2015   BREAST EXCISIONAL BIOPSY Right 09/23/2015   BREAST LUMPECTOMY WITH RADIOACTIVE SEED LOCALIZATION Right 09/23/2015   Procedure: BREAST LUMPECTOMY WITH RADIOACTIVE SEED LOCALIZATION;  Surgeon: Autumn Messing III, MD;  Location: [place];  Service: General;  Laterality: Right;   BUNIONECTOMY Bilateral [place] Right 2012   URETER SURGERY     as child    FAMILY HISTORY: Family History  Problem Relation Age of Onset   Hyperlipidemia Mother    Hypertension Mother    Hypertension Maternal Grandfather    Hyperlipidemia Maternal Grandfather    Hyperlipidemia Paternal Grandmother    Hypertension Paternal Grandmother    Stroke Paternal Grandmother    Hyperlipidemia Paternal Grandfather    Hypertension Paternal Grandfather    Breast cancer Neg Hx     SOCIAL HISTORY: Social History   Socioeconomic History   Marital status: Married    Spouse name: Not on file   Number of children: Not on file   Years of education: Not on file    Highest education level: Not on file  Occupational History   Not on file  Tobacco Use   Smoking status: Never   Smokeless tobacco: Not on file  Vaping Use   Vaping Use: Never used  Substance and Sexual Activity   Alcohol use: Yes    Alcohol/week: 0.0 standard drinks of alcohol    Comment: social   Drug use: No   Sexual activity: Yes    Birth control/protection: None  Other Topics Concern   Not on file  Social History Narrative   Not on file   Social  Determinants of Health   Financial Resource Strain: Not on file  Food Insecurity: Not on file  Transportation Needs: Not on file  Physical Activity: Not on file  Stress: Not on file  Social Connections: Not on file  Intimate Partner Violence: Not on file    PHYSICAL EXAM  GENERAL EXAM/CONSTITUTIONAL: Vitals:  Vitals:   08/15/22 0811  BP: 134/81  Pulse: 87  Weight: 145 lb (65.8 kg)  Height: 5\' 6"  (1.676 m)   Body mass index is 23.4 kg/m. Wt Readings from Last 3 Encounters:  08/15/22 145 lb (65.8 kg)  09/05/21 140 lb (63.5 kg)  04/19/21 140 lb (63.5 kg)   Patient is in no distress; well developed, nourished and groomed; neck is supple  EYES: Visual fields full to confrontation, Extraocular movements intacts,   MUSCULOSKELETAL: Gait, strength, tone, movements noted in Neurologic exam below  NEUROLOGIC: MENTAL STATUS:      No data to display         awake, alert, oriented to person, place and time recent and remote memory intact normal attention and concentration language fluent, comprehension intact, naming intact fund of knowledge appropriate  CRANIAL NERVE:  2nd, 3rd, 4th, 6th - Normal fundoscopy, pupils equal, round and reactive to light. Visual fields full to confrontation, extraocular muscles intact, no nystagmus 5th - facial sensation symmetric 7th - facial strength symmetric 8th - hearing intact 9th - palate elevates symmetrically, uvula midline 11th - shoulder shrug symmetric 12th - tongue  protrusion midline  MOTOR:  normal bulk and tone, full strength in the BUE, BLE  SENSORY:  normal and symmetric to light touch  COORDINATION:  finger-nose-finger, fine finger movements normal  REFLEXES:  deep tendon reflexes present and symmetric  GAIT/STATION:  normal   DIAGNOSTIC DATA (LABS, IMAGING, TESTING) - I reviewed patient records, labs, notes, testing and imaging myself where available.  No results found for: "WBC", "HGB", "HCT", "MCV", "PLT" No results found for: "NA", "K", "CL", "CO2", "GLUCOSE", "BUN", "CREATININE", "CALCIUM", "PROT", "ALBUMIN", "AST", "ALT", "ALKPHOS", "BILITOT", "GFRNONAA", "GFRAA" No results found for: "CHOL", "HDL", "LDLCALC", "LDLDIRECT", "TRIG", "CHOLHDL" No results found for: "HGBA1C" No results found for: "VITAMINB12" No results found for: "TSH"   ASSESSMENT AND PLAN  57 y.o. year old female with history of seizure resolved, insomnia, headaches who is presenting for further management of the headaches and vision changes.  Her migraine frequency is about twice a year controlled with sumatriptan.  She also have ocular migraine without the headaches.  Since this headache started after patient turning 49, I will obtain an MRI brain with and without contrast.  I have advised her to continue with sumatriptan as needed for headache and she can also take it with Aleve.  I will contact her to go over the MRI result.  I will see her in 1 year for follow-up or sooner if worse.  She voices understanding.   1. Migraine without aura and without status migrainosus, not intractable   2. Ocular migraine   3. History of seizure      Patient Instructions  Continue with sumatriptan 100 mg as needed for headache, can combine it with Aleve, up to 500 mg MRI brain with and without contrast Follow-up in 1 year or sooner if worse.  No orders of the defined types were placed in this encounter.   No orders of the defined types were placed in this  encounter.   Return in about 1 year (around 08/16/2023).    08/18/2023, MD 08/15/2022,  8:44 AM  [facility] [address], [place], [place] [address] [phone]

## 2022-08-15 NOTE — Telephone Encounter (Signed)
[employer] sent to [facility] they obtain auth [id number] 

## 2022-08-20 ENCOUNTER — Other Ambulatory Visit: Payer: Managed Care, Other (non HMO)

## 2022-08-21 NOTE — Telephone Encounter (Signed)
Donna Christen is calling from [place]. Stated that office notes for the MRI and scans to get authorization approved. Christella Scheuermann Fax [phone]

## 2022-08-21 NOTE — Telephone Encounter (Signed)
Taron at [facility] is uploading notes now

## 2022-08-29 ENCOUNTER — Ambulatory Visit
Admission: RE | Admit: 2022-08-29 | Discharge: 2022-08-29 | Disposition: A | Discharge status: Home / Self Care | Payer: Managed Care, Other (non HMO) | Source: Ambulatory Visit | Attending: Neurology | Admitting: Neurology

## 2022-08-29 DIAGNOSIS — G43009 Migraine without aura, not intractable, without status migrainosus: Secondary | ICD-10-CM

## 2022-08-29 DIAGNOSIS — G43109 Migraine with aura, not intractable, without status migrainosus: Secondary | ICD-10-CM

## 2022-08-29 MED ORDER — GADOPICLENOL 0.5 MMOL/ML IV SOLN
7.0000 mL | Freq: Once | INTRAVENOUS | Status: AC | PRN
  Administered 2022-08-29: 7 mL via INTRAVENOUS

## 2022-08-30 ENCOUNTER — Encounter: Payer: Self-pay | Admitting: Neurology

## 2022-09-11 ENCOUNTER — Ambulatory Visit
Admission: RE | Admit: 2022-09-11 | Discharge: 2022-09-11 | Disposition: A | Discharge status: Home / Self Care | Payer: Managed Care, Other (non HMO) | Source: Ambulatory Visit | Attending: Family Medicine | Admitting: Family Medicine

## 2022-09-11 DIAGNOSIS — Z1231 Encounter for screening mammogram for malignant neoplasm of breast: Secondary | ICD-10-CM

## 2023-01-10 ENCOUNTER — Other Ambulatory Visit: Payer: Self-pay | Admitting: Obstetrics and Gynecology

## 2023-01-10 DIAGNOSIS — N362 Urethral caruncle: Secondary | ICD-10-CM

## 2023-01-21 ENCOUNTER — Other Ambulatory Visit: Payer: Self-pay

## 2023-01-21 DIAGNOSIS — N362 Urethral caruncle: Secondary | ICD-10-CM

## 2023-01-21 MED ORDER — ESTRADIOL 0.1 MG/GM VA CREA: 0.5000 g | TOPICAL_CREAM | VAGINAL | 11 refills | Status: DC

## 2023-06-03 ENCOUNTER — Telehealth: Payer: Self-pay

## 2023-06-03 NOTE — Telephone Encounter (Signed)
error 

## 2023-07-02 ENCOUNTER — Ambulatory Visit: Payer: Managed Care, Other (non HMO) | Admitting: Neurology

## 2023-07-02 ENCOUNTER — Encounter: Payer: Self-pay | Admitting: Neurology

## 2023-07-02 VITALS — BP 136/74 | HR 85 | Ht 66.0 in | Wt 146.5 lb

## 2023-07-02 DIAGNOSIS — G43109 Migraine with aura, not intractable, without status migrainosus: Secondary | ICD-10-CM | POA: Diagnosis not present

## 2023-07-02 DIAGNOSIS — Z87898 Personal history of other specified conditions: Secondary | ICD-10-CM | POA: Diagnosis not present

## 2023-07-02 DIAGNOSIS — G43009 Migraine without aura, not intractable, without status migrainosus: Secondary | ICD-10-CM

## 2023-07-02 NOTE — Patient Instructions (Signed)
Continue with Sumatriptan as needed for your migraines  Consider taking it with Aleve 440 mg  Continue to follow up with PCP  Return in a year or sooner if worse

## 2023-07-02 NOTE — Progress Notes (Signed)
[facility]  PATIENT: [name] DOB: [birth year]  REQUESTING CLINICIAN: Carilyn Goodpasture, NP HISTORY FROM: Patient  REASON FOR VISIT: Migraines and vision changes    HISTORICAL  CHIEF COMPLAINT:  Chief Complaint  Patient presents with   New Patient (Initial Visit)    Rm12, alone, Worsening migraines:6/30 days, triggers: unidentifiable.    INTERVAL HISTORY 07/02/2023:  Patient presents today for follow-up, last visit was in January, at that time she was complaining of migraines once or twice per month, controlled on sumatriptan.  She was doing well until the week of Thanksgiving when she had a headache lasting for 3 days and the week following Thanksgiving again headache lasting for 3 days, where she felt like her head was heavy and she was going to have a bad migraine.  She could not think of any trigger, has not done anything different from her routine.  She reports that the headaches were not debilitating. She is still compliant with the sumatriptan but reports that the medication sometimes make her groggy.   HISTORY OF PRESENT ILLNESS:  This is a 57 year old woman with past medical history of insomnia, headaches, history of seizures resolved, who is presenting to establish care.  In terms of the headaches, patient reported being diagnosed with migraine for the past 6 years, she will have right-sided headache initially was responding to over-the-counter but in the past 2 years required sumatriptan 100 mg.  Again these headaches happen once or twice a year. No associated nausea or vomiting and the Sumatriptan works very well.  During this time also she has been complaining of vision change.  She described her vision changes as stroke of lights, she feels lightheaded and cannot focus, initially she thought it was due to low blood sugar, she will eat and drink and this will go away within a minute or 2.  She also described shimmering of light that lasted about 20  minutes without any headaches she reported 2 episodes in the past 6 years   History of epilepsy, diagnosed at the age of 57, had 2 syncopal episodes, and a nabnormal eeg, was on phenobarb from the age of 57 to 17. Since then, she is off phenobarb and no seizures or seizures like events.   OTHER MEDICAL CONDITIONS: Headaches, Insomnia    REVIEW OF SYSTEMS: Full 14 system review of systems performed and negative with exception of: As noted in the HPI   ALLERGIES: Allergies  Allergen Reactions   Bee Venom Anaphylaxis   Keflex [Cephalexin]    Sulfa Antibiotics Hives   Latex Rash    HOME MEDICATIONS: Outpatient Medications Prior to Visit  Medication Sig Dispense Refill   acetaminophen (TYLENOL) 500 MG tablet Take 1,000 mg by mouth every 6 (six) hours as needed (for pain.).      Cyanocobalamin (B-12 PO) Take 1 mL by mouth daily.     estradiol (ESTRACE) 0.1 MG/GM vaginal cream PLACE 0.5G VAGINALLY NIGHTLY FOR 2 WEEKS, THEN TWICE WEEKLY 42.5 g 11   estradiol (ESTRACE) 0.1 MG/GM vaginal cream Place 0.5 g vaginally 2 (two) times a week. Place 0.5g nightly for two weeks then twice a week after 30 g 11   ibuprofen (ADVIL,MOTRIN) 200 MG tablet Take 200 mg by mouth every 6 (six) hours as needed (for pain.).     Ketotifen Fumarate (ALLERGY EYE DROPS OP) Apply to eye.     loratadine (CLARITIN) 10 MG tablet Take 10 mg by mouth daily.     NON FORMULARY Inject  1 Syringe into the muscle every 30 (thirty) days. Allergy Shot received from [facility]     SUMAtriptan (IMITREX) 100 MG tablet Take 100 mg by mouth as directed.     VITAMIN D PO Take 5,000 Units by mouth.     zolpidem (AMBIEN) 5 MG tablet Take 5 mg by mouth at bedtime as needed for sleep.     cholecalciferol (VITAMIN D3) 25 MCG (1000 UNIT) tablet Take 1,000 Units by mouth daily.     Melatonin 3 MG CAPS Take 3 capsules by mouth continuous as needed (as needed per patient).     Multiple Vitamins-Minerals (AIRBORNE PO) Take 1 tablet  by mouth 3 (three) times daily as needed (for immune health).     Multiple Vitamins-Minerals (EMERGEN-C IMMUNE PLUS PO) Take 1 packet by mouth 2 (two) times daily as needed.      Multiple Vitamins-Minerals (MULTIVITAMIN WITH MINERALS) tablet Take 1 tablet by mouth daily.     No facility-administered medications prior to visit.    PAST MEDICAL HISTORY: Past Medical History:  Diagnosis Date   Allergy    Breast mass, right    Complication of anesthesia    hard to wake up   GERD (gastroesophageal reflux disease)    Seizures (HCC)    as teen, no meds since age 42, no seizures    PAST SURGICAL HISTORY: Past Surgical History:  Procedure Laterality Date   ABDOMINAL HYSTERECTOMY  2006   AP repair also   ABDOMINAL SACROCOLPOPEXY     with paravaginal repair   BREAST BIOPSY Right 08/31/2015   BREAST EXCISIONAL BIOPSY Right 09/23/2015   BREAST LUMPECTOMY WITH RADIOACTIVE SEED LOCALIZATION Right 09/23/2015   Procedure: BREAST LUMPECTOMY WITH RADIOACTIVE SEED LOCALIZATION;  Surgeon: Chevis Pretty III, MD;  Location: [facility];  Service: General;  Laterality: Right;   BUNIONECTOMY Bilateral 1986   EYE SURGERY     lasik   ROTATOR CUFF REPAIR Right 2012   URETER SURGERY     as child    FAMILY HISTORY: Family History  Problem Relation Age of Onset   Hyperlipidemia Mother    Hypertension Mother    Hypertension Maternal Grandfather    Hyperlipidemia Maternal Grandfather    Hyperlipidemia Paternal Grandmother    Hypertension Paternal Grandmother    Stroke Paternal Grandmother    Hyperlipidemia Paternal Grandfather    Hypertension Paternal Grandfather    Breast cancer Neg Hx     SOCIAL HISTORY: Social History   Socioeconomic History   Marital status: Married    Spouse name: Not on file   Number of children: Not on file   Years of education: Not on file   Highest education level: Not on file  Occupational History   Not on file  Tobacco Use   Smoking status: Never    Smokeless tobacco: Not on file  Vaping Use   Vaping status: Never Used  Substance and Sexual Activity   Alcohol use: Yes    Alcohol/week: 0.0 standard drinks of alcohol    Comment: social   Drug use: No   Sexual activity: Yes    Birth control/protection: None  Other Topics Concern   Not on file  Social History Narrative   Not on file   Social Drivers of Health   Financial Resource Strain: Not on file  Food Insecurity: Not on file  Transportation Needs: Not on file  Physical Activity: Not on file  Stress: Not on file  Social Connections: Not on file  Intimate Partner Violence: Not on file    PHYSICAL EXAM  GENERAL EXAM/CONSTITUTIONAL: Vitals:  Vitals:   07/02/23 1359  BP: 136/74  Pulse: 85  Weight: 146 lb 8 oz (66.5 kg)  Height: 5\' 6"  (1.676 m)   Body mass index is 23.65 kg/m. Wt Readings from Last 3 Encounters:  07/02/23 146 lb 8 oz (66.5 kg)  08/15/22 145 lb (65.8 kg)  09/05/21 140 lb (63.5 kg)   Patient is in no distress; well developed, nourished and groomed; neck is supple  MUSCULOSKELETAL: Gait, strength, tone, movements noted in Neurologic exam below  NEUROLOGIC: MENTAL STATUS:      No data to display         awake, alert, oriented to person, place and time recent and remote memory intact normal attention and concentration language fluent, comprehension intact, naming intact fund of knowledge appropriate  CRANIAL NERVE:  2nd, 3rd, 4th, 6th - pupils equal, round and reactive to light. Visual fields full to confrontation, extraocular muscles intact, no nystagmus 5th - facial sensation symmetric 7th - facial strength symmetric 8th - hearing intact 9th - palate elevates symmetrically, uvula midline 11th - shoulder shrug symmetric 12th - tongue protrusion midline  MOTOR:  normal bulk and tone, full strength in the BUE, BLE  SENSORY:  normal and symmetric to light touch  COORDINATION:  finger-nose-finger, fine finger movements  normal  GAIT/STATION:  normal   DIAGNOSTIC DATA (LABS, IMAGING, TESTING) - I reviewed patient records, labs, notes, testing and imaging myself where available.  No results found for: "WBC", "HGB", "HCT", "MCV", "PLT" No results found for: "NA", "K", "CL", "CO2", "GLUCOSE", "BUN", "CREATININE", "CALCIUM", "PROT", "ALBUMIN", "AST", "ALT", "ALKPHOS", "BILITOT", "GFRNONAA", "GFRAA" No results found for: "CHOL", "HDL", "LDLCALC", "LDLDIRECT", "TRIG", "CHOLHDL" No results found for: "HGBA1C" No results found for: "VITAMINB12" No results found for: "TSH"  MRI Brain with and without contrast 08/29/2022 Unremarkable MRI brain (with and without). No acute findings.    ASSESSMENT AND PLAN  57 y.o. year old female with history of seizure resolved, insomnia, migraines headaches who is presenting for follow up of her migraines.  Overall the frequency is stable but on Thanksgiving weekend she had a headache lasting for 3 days and the week following another episode lasting for 3 days.  She did have an MRI brain which was unremarkable.  After further discussing on abortive therapy, patient opted to continue with her sumatriptan 100 mg as needed.  I did advise her to take it with Aleve 440 mg as the combination provide better relief.  She voiced understanding.  Continue to follow with PCP and I will see her in 1 year for follow-up or sooner if worse.  1. Migraine without aura and without status migrainosus, not intractable   2. Ocular migraine   3. History of seizure      Patient Instructions  Continue with Sumatriptan as needed for your migraines  Consider taking it with Aleve 440 mg  Continue to follow up with PCP  Return in a year or sooner if worse   No orders of the defined types were placed in this encounter.   No orders of the defined types were placed in this encounter.   Return in about 1 year (around 07/01/2024).    Windell Norfolk, MD 07/02/2023, 4:42 PM  [facility] [address] [place], [place] [address] [phone]

## 2023-08-12 ENCOUNTER — Other Ambulatory Visit: Payer: Self-pay | Admitting: Family Medicine

## 2023-08-12 DIAGNOSIS — Z1231 Encounter for screening mammogram for malignant neoplasm of breast: Secondary | ICD-10-CM

## 2023-08-19 ENCOUNTER — Ambulatory Visit: Payer: Managed Care, Other (non HMO) | Admitting: Neurology

## 2023-09-16 ENCOUNTER — Ambulatory Visit
Admission: RE | Admit: 2023-09-16 | Discharge: 2023-09-16 | Disposition: A | Discharge status: Home / Self Care | Payer: Managed Care, Other (non HMO) | Source: Ambulatory Visit | Attending: Family Medicine | Admitting: Family Medicine

## 2023-09-16 DIAGNOSIS — Z1231 Encounter for screening mammogram for malignant neoplasm of breast: Secondary | ICD-10-CM

## 2024-01-23 ENCOUNTER — Other Ambulatory Visit: Payer: Self-pay | Admitting: Obstetrics and Gynecology

## 2024-01-23 DIAGNOSIS — N362 Urethral caruncle: Secondary | ICD-10-CM

## 2024-02-04 ENCOUNTER — Other Ambulatory Visit (HOSPITAL_BASED_OUTPATIENT_CLINIC_OR_DEPARTMENT_OTHER): Payer: Self-pay | Admitting: Internal Medicine

## 2024-02-04 DIAGNOSIS — E785 Hyperlipidemia, unspecified: Secondary | ICD-10-CM

## 2024-02-07 ENCOUNTER — Encounter: Payer: Self-pay | Admitting: Obstetrics and Gynecology

## 2024-02-07 ENCOUNTER — Ambulatory Visit: Admitting: Obstetrics and Gynecology

## 2024-02-07 DIAGNOSIS — N952 Postmenopausal atrophic vaginitis: Secondary | ICD-10-CM | POA: Diagnosis not present

## 2024-02-07 MED ORDER — ESTRADIOL 0.1 MG/GM VA CREA: TOPICAL_CREAM | VAGINAL | 11 refills | Status: AC

## 2024-02-07 NOTE — Progress Notes (Signed)
 [facility] Return Visit  SUBJECTIVE  History of Present Illness: [name] is a 58 y.o. female seen in follow-up for stress urinary incontinence. Plan at last visit was to start pelvic physical therapy.   Had done some pelvic PT and SUI has resolved unless she has a cold and does a lot of coughing. Denies any urgency/ frequency but has nocturia. She does drink prior to bedtime. It is not bothersome for her.   Denies vaginal bleeding. Estradiol  helps with dryness. She is also using coconut oil.   Denies any issues with her bowels.   Surg history: 2018: S/P Abdominal sacrocolpopexy, bilateral salpingectomy, Burch, paravaginal repair, and cystoscopy 2006: S/P TV[facility], Left paravaginal repair, anterior and posterior repair, and cystoscopy   Past Medical History: Patient  has a past medical history of Allergy, Breast mass, right, Complication of anesthesia, GERD (gastroesophageal reflux disease), Seizures (HCC), and Spondylolisthesis.   Past Surgical History: She  has a past surgical history that includes Eye surgery; Bunionectomy (Bilateral, 1986); Rotator cuff repair (Right, 2012); Ureter surgery; Abdominal hysterectomy (2006); Breast lumpectomy with radioactive seed localization (Right, 09/23/2015); Breast biopsy (Right, 08/31/2015); Breast excisional biopsy (Right, 09/23/2015); and Abdominal sacrocolpopexy.   Medications: She has a current medication list which includes the following prescription(s): acetaminophen , cyanocobalamin, estradiol , ibuprofen, ketotifen fumarate, loratadine, NON FORMULARY, sumatriptan, vitamin d, zolpidem, and estradiol .   Allergies: Patient is allergic to bee venom, keflex  [cephalexin ], sulfa antibiotics, and latex.   Social History: Patient  reports that she has never smoked. She does not have any smokeless tobacco history on file. She reports current alcohol use. She reports that she does not use drugs.      OBJECTIVE     Physical  Exam: Vitals:   02/07/24 1457  BP: 116/80  Pulse: 67   Gen: No apparent distress, A&O x 3.  Detailed Urogynecologic Evaluation:  Normal external genitalia. On speculum, normal vaginal mucosa. On bimanual, no masses present. No visible or palpable mesh.     ASSESSMENT AND PLAN    [name] is a 58 y.o. with:  1. Vaginal atrophy    - Vagina well supported, no signs of issue with mesh - Continue vaginal estrogen 2-3 times per week, refill provided.   Follow up 1 year or sooner if needed  Alison LOISE Caper, MD

## 2024-02-18 ENCOUNTER — Ambulatory Visit (HOSPITAL_BASED_OUTPATIENT_CLINIC_OR_DEPARTMENT_OTHER)
Admission: RE | Admit: 2024-02-18 | Discharge: 2024-02-18 | Disposition: A | Discharge status: Home / Self Care | Payer: Self-pay | Source: Ambulatory Visit | Attending: Internal Medicine | Admitting: Internal Medicine

## 2024-02-18 DIAGNOSIS — E785 Hyperlipidemia, unspecified: Secondary | ICD-10-CM | POA: Insufficient documentation

## 2024-05-15 ENCOUNTER — Encounter: Payer: Self-pay | Admitting: Podiatry

## 2024-05-15 ENCOUNTER — Ambulatory Visit: Admitting: Podiatry

## 2024-05-15 DIAGNOSIS — M722 Plantar fascial fibromatosis: Secondary | ICD-10-CM | POA: Diagnosis not present

## 2024-05-15 MED ORDER — TRIAMCINOLONE ACETONIDE 10 MG/ML IJ SUSP
5.0000 mg | Freq: Once | INTRAMUSCULAR | Status: AC
  Administered 2024-05-15: 5 mg via INTRAMUSCULAR

## 2024-05-15 MED ORDER — MELOXICAM 15 MG PO TABS: 15.0000 mg | ORAL_TABLET | Freq: Every day | ORAL | 0 refills | Status: DC | PRN

## 2024-05-15 NOTE — Patient Instructions (Signed)

## 2024-05-15 NOTE — Progress Notes (Signed)
 Subjective:  Patient ID: [name], female    DOB: [birth year],  MRN: [id number]  Chief Complaint  Patient presents with   Foot Pain    Patient is here for right heel pain for last 4 months     Discussed the use of AI scribe software for clinical note transcription with the patient, who gave verbal consent to proceed.  History of Present Illness [name] is a 58 year old female who presents with right foot pain.  She experiences significant aching and throbbing pain in her right foot, particularly while driving, consistently present throughout the day for the past four months. The pain is located on the outside of her heel and another spot towards the heel. There is no numbness, tingling, or specific injuries.  She had similar issues with her left foot, which improved after discontinuing orthotics. She manages her symptoms with daily exercises, including pull-back massages, walking on tiptoes, and heel raises. She uses a soft cup in her shoe and wears supportive shoes, but all her shoes cause discomfort.  An x-ray showed no fractures. A cortisone injection in July or August provided temporary relief. Icing and naproxen  have been used for pain management, with the pain returning after stopping the medication.  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 She uses an acupressure ball to stretch her foot in the morning. The pain does not disturb her sleep.      Objective:  There were no vitals filed for this visit.  Physical Exam General: AAO x3, NAD  Dermatological: Skin is warm, dry and supple bilateral. There are no open sores, no preulcerative lesions, no rash or signs of infection present.  Vascular: Dorsalis Pedis artery and Posterior Tibial artery pedal pulses are 2/4 bilateral with immedate capillary fill time. There is no pain with calf compression, swelling, warmth, erythema.   Neruologic: Grossly intact via  light touch bilateral. Negative tinel sign.   Musculoskeletal: Tenderness to palpation along the plantar medial tubercle of the calcaneus at the insertion of plantar fascia on the right foot. There is no pain along the course of the plantar fascia within the arch of the foot. Plantar fascia appears to be intact. There is no pain with lateral compression of the calcaneus or pain with vibratory sensation. There is no pain along the course or insertion of the achilles tendon. No other areas of tenderness to bilateral lower extremities.     No images are attached to the encounter.    Results RADIOLOGY Foot X-ray: No fracture reported per the patient.     Assessment:   1. Plantar fasciitis, right      Plan:  Patient was evaluated and treated and all questions answered.  Assessment and Plan Assessment & Plan Right foot plantar fasciitis Chronic plantar fasciitis with persistent heel and arch pain due to tight calf muscles and Achilles tendon. Previous injection relief has diminished. X-rays negative for fractures. - Administered corticosteroid injection to right foot. See procedure note below. - Applied KT tape for support. - Prescribed meloxicam 15 mg daily with food for inflammation. - Continue daily calf and Achilles stretching exercises. Provided exercise worksheet. - Advised use of night splint during rest to maintain foot position. - Recommended alternating supportive shoes like Brooks and Vionic. - Advised against high-impact activities until symptoms improve. - Encouraged daily icing of foot for 20 minutes. - Discussed potential future treatments: shockwave therapy, physical therapy, PRP injections. -  Schedule follow-up in one month to assess progress.  Procedure: Injection Tendon/Ligament Discussed alternatives, risks, complications and verbal consent was obtained.  Location: Right plantar fascia at the glabrous junction; medial approach. Skin Prep: Alcohol. Injectate:  0.5cc 0.5% marcaine  plain, 0.5 cc 2% lidocaine  plain and, 1 cc kenalog  10. Disposition: Patient tolerated procedure well. Injection site dressed with a band-aid.  Post-injection care was discussed and return precautions discussed.    Return in about 4 weeks (around 06/12/2024), or if symptoms worsen or fail to improve, for heel pain.   Donnice JONELLE Fees DPM

## 2024-06-14 ENCOUNTER — Other Ambulatory Visit: Payer: Self-pay | Admitting: Podiatry

## 2024-07-01 ENCOUNTER — Encounter: Payer: Self-pay | Admitting: Neurology

## 2024-07-01 ENCOUNTER — Ambulatory Visit: Payer: Managed Care, Other (non HMO) | Admitting: Neurology

## 2024-07-01 ENCOUNTER — Other Ambulatory Visit: Payer: Self-pay | Admitting: Podiatry

## 2024-07-01 ENCOUNTER — Telehealth: Payer: Self-pay

## 2024-07-01 VITALS — BP 118/78 | HR 74 | Ht 66.0 in | Wt 146.0 lb

## 2024-07-01 DIAGNOSIS — G43109 Migraine with aura, not intractable, without status migrainosus: Secondary | ICD-10-CM | POA: Diagnosis not present

## 2024-07-01 DIAGNOSIS — G43009 Migraine without aura, not intractable, without status migrainosus: Secondary | ICD-10-CM | POA: Diagnosis not present

## 2024-07-01 MED ORDER — METHYLPREDNISOLONE 4 MG PO TBPK: ORAL_TABLET | ORAL | 0 refills | Status: AC

## 2024-07-01 MED ORDER — SUMATRIPTAN SUCCINATE 100 MG PO TABS: 100.0000 mg | ORAL_TABLET | ORAL | 1 refills | Status: AC

## 2024-07-01 NOTE — Progress Notes (Signed)
 [facility]  PATIENT: [name] DOB: May 07, 1966  REQUESTING CLINICIAN: No ref. provider found HISTORY FROM: Patient  REASON FOR VISIT: Migraines and vision changes    HISTORICAL  CHIEF COMPLAINT:  Chief Complaint  Patient presents with   Follow-up    Room 13 Alone Migraine Annual Follow up   INTERVAL HISTORY 07/01/2024 Patient presents today for follow-up, last visit was a year ago, since then she has been doing well.  She tells me in the past year, she has 2 cluster of headaches lasting 3 to 4 days.  With these headaches Aleve  or sumatriptan  does help.  After taking sumatriptan  the headache resolved within 30 minutes.  She did have 1 ocular migraine.  Overall this is a stability of her migraines, definitely not getting worse.     INTERVAL HISTORY 07/02/2023:  Patient presents today for follow-up, last visit was in January, at that time she was complaining of migraines once or twice per month, controlled on sumatriptan .  She was doing well until the week of Thanksgiving when she had a headache lasting for 3 days and the week following Thanksgiving again headache lasting for 3 days, where she felt like her head was heavy and she was going to have a bad migraine.  She could not think of any trigger, has not done anything different from her routine.  She reports that the headaches were not debilitating. She is still compliant with the sumatriptan  but reports that the medication sometimes make her groggy.   HISTORY OF PRESENT ILLNESS:  This is a 58 year old woman with past medical history of insomnia, headaches, history of seizures resolved, who is presenting to establish care.  In terms of the headaches, patient reported being diagnosed with migraine for the past 6 years, she will have right-sided headache initially was responding to over-the-counter but in the past 2 years required sumatriptan  100 mg.  Again these headaches happen once or twice a year. No  associated nausea or vomiting and the Sumatriptan  works very well.  During this time also she has been complaining of vision change.  She described her vision changes as stroke of lights, she feels lightheaded and cannot focus, initially she thought it was due to low blood sugar, she will eat and drink and this will go away within a minute or 2.  She also described shimmering of light that lasted about 20 minutes without any headaches she reported 2 episodes in the past 6 years   History of epilepsy, diagnosed at the age of 38, had 2 syncopal episodes, and a nabnormal eeg, was on phenobarb from the age of 58 to 61. Since then, she is off phenobarb and no seizures or seizures like events.   OTHER MEDICAL CONDITIONS: Headaches, Insomnia    REVIEW OF SYSTEMS: Full 14 system review of systems performed and negative with exception of: As noted in the HPI   ALLERGIES: Allergies  Allergen Reactions   Bee Venom Anaphylaxis   Keflex  [Cephalexin ]    Sulfa Antibiotics Hives   Latex Rash    HOME MEDICATIONS: Outpatient Medications Prior to Visit  Medication Sig Dispense Refill   acetaminophen  (TYLENOL ) 500 MG tablet Take 1,000 mg by mouth every 6 (six) hours as needed (for pain.).      cyanocobalamin (VITAMIN B12) 1000 MCG tablet Take 5,000 mcg by mouth daily.     estradiol  (ESTRACE ) 0.1 MG/GM vaginal cream INSERT 1/2 GRAM VAGINALLY NIGHTLY FOR TWO WEEKS, THEN DECREASE DOSE TO 0.5G TWICE A WEEK  THEREAFTER 42.5 g 0   estradiol  (ESTRACE ) 0.1 MG/GM vaginal cream Use 0.5g in the vagina 2-3 times a week 42.5 g 11   Ketotifen Fumarate (ALLERGY EYE DROPS OP) Apply to eye. prn     loratadine (CLARITIN) 10 MG tablet Take 10 mg by mouth daily as needed for allergies.     magnesium gluconate (MAGONATE) 500 (27 Mg) MG TABS tablet Take 200 mg by mouth once.     meloxicam  (MOBIC ) 15 MG tablet TAKE 1 TABLET(15 MG) BY MOUTH DAILY AS NEEDED FOR PAIN 30 tablet 0   NON FORMULARY Inject 1 Syringe into the muscle  every 30 (thirty) days. Allergy Shot received from [facility]     VITAMIN D PO Take 2,000 Units by mouth daily. Vitamin D 2000 daily     zolpidem (AMBIEN) 5 MG tablet Take 5 mg by mouth at bedtime as needed for sleep.     Cyanocobalamin (B-12 PO) Take 1 mL by mouth daily.     SUMAtriptan  (IMITREX ) 100 MG tablet Take 100 mg by mouth as directed.     No facility-administered medications prior to visit.    PAST MEDICAL HISTORY: Past Medical History:  Diagnosis Date   Allergy    Breast mass, right    Complication of anesthesia    hard to wake up   GERD (gastroesophageal reflux disease)    Seizures (HCC)    as teen, no meds since age 58, no seizures   Spondylolisthesis     PAST SURGICAL HISTORY: Past Surgical History:  Procedure Laterality Date   ABDOMINAL HYSTERECTOMY  2006   AP repair also   ABDOMINAL SACROCOLPOPEXY     with paravaginal repair   BREAST BIOPSY Right 08/31/2015   BREAST EXCISIONAL BIOPSY Right 09/23/2015   BREAST LUMPECTOMY WITH RADIOACTIVE SEED LOCALIZATION Right 09/23/2015   Procedure: BREAST LUMPECTOMY WITH RADIOACTIVE SEED LOCALIZATION;  Surgeon: Deward Null III, MD;  Location: [facility];  Service: General;  Laterality: Right;   BUNIONECTOMY Bilateral 1986   EYE SURGERY     lasik   ROTATOR CUFF REPAIR Right 2012   URETER SURGERY     as child    FAMILY HISTORY: Family History  Problem Relation Age of Onset   Hyperlipidemia Mother    Hypertension Mother    Hypertension Maternal Grandfather    Hyperlipidemia Maternal Grandfather    Hyperlipidemia Paternal Grandmother    Hypertension Paternal Grandmother    Stroke Paternal Grandmother    Hyperlipidemia Paternal Grandfather    Hypertension Paternal Grandfather    Breast cancer Neg Hx     SOCIAL HISTORY: Social History   Socioeconomic History   Marital status: Married    Spouse name: Not on file   Number of children: Not on file   Years of education: Not on file    Highest education level: Not on file  Occupational History   Not on file  Tobacco Use   Smoking status: Never   Smokeless tobacco: Not on file  Vaping Use   Vaping status: Never Used  Substance and Sexual Activity   Alcohol use: Yes    Alcohol/week: 0.0 standard drinks of alcohol    Comment: social   Drug use: No   Sexual activity: Yes    Birth control/protection: None  Other Topics Concern   Not on file  Social History Narrative   Not on file   Social Drivers of Health   Tobacco Use: Unknown (07/01/2024)   Patient History  Smoking Tobacco Use: Never    Smokeless Tobacco Use: Unknown    Passive Exposure: Not on file  Financial Resource Strain: Not on file  Food Insecurity: Not on file  Transportation Needs: Not on file  Physical Activity: Not on file  Stress: Not on file  Social Connections: Not on file  Intimate Partner Violence: Not on file  Depression (EYV7-0): Not on file  Alcohol Screen: Not on file  Housing: Not on file  Utilities: Not on file  Health Literacy: Not on file    PHYSICAL EXAM  GENERAL EXAM/CONSTITUTIONAL: Vitals:  Vitals:   07/01/24 1354  BP: 118/78  Pulse: 74  SpO2: 96%  Weight: 146 lb (66.2 kg)  Height: 5' 6 (1.676 m)   Body mass index is 23.57 kg/m. Wt Readings from Last 3 Encounters:  07/01/24 146 lb (66.2 kg)  07/02/23 146 lb 8 oz (66.5 kg)  08/15/22 145 lb (65.8 kg)   Patient is in no distress; well developed, nourished and groomed; neck is supple  MUSCULOSKELETAL: Gait, strength, tone, movements noted in Neurologic exam below  NEUROLOGIC: MENTAL STATUS:      No data to display         awake, alert, oriented to person, place and time recent and remote memory intact normal attention and concentration language fluent, comprehension intact, naming intact fund of knowledge appropriate  CRANIAL NERVE:  2nd, 3rd, 4th, 6th - pupils equal, round and reactive to light. Visual fields full to confrontation, extraocular  muscles intact, no nystagmus 5th - facial sensation symmetric 7th - facial strength symmetric 8th - hearing intact 9th - palate elevates symmetrically, uvula midline 11th - shoulder shrug symmetric 12th - tongue protrusion midline  MOTOR:  normal bulk and tone, full strength in the BUE, BLE  SENSORY:  normal and symmetric to light touch  COORDINATION:  finger-nose-finger, fine finger movements normal  GAIT/STATION:  normal   DIAGNOSTIC DATA (LABS, IMAGING, TESTING) - I reviewed patient records, labs, notes, testing and imaging myself where available.  No results found for: WBC, HGB, HCT, MCV, PLT No results found for: NA, K, CL, CO2, GLUCOSE, BUN, CREATININE, CALCIUM, PROT, ALBUMIN, AST, ALT, ALKPHOS, BILITOT, GFRNONAA, GFRAA No results found for: CHOL, HDL, LDLCALC, LDLDIRECT, TRIG, CHOLHDL No results found for: YHAJ8R No results found for: VITAMINB12 No results found for: TSH  MRI Brain with and without contrast 08/29/2022 Unremarkable MRI brain (with and without). No acute findings.    ASSESSMENT AND PLAN  58 y.o. year old female with history of seizure resolved, insomnia, migraines headaches who is presenting for follow up of her migraines.  Had 2 episodes of cluster headaches this past year each lasting 3 to 4 days.  Aleve  and sumatriptan  helps.  Plan will be for patient to continue with Aleve  and sumatriptan  on a as needed basis.  She will continue to follow with PCP and return in a year or sooner if worse.   1. Migraine without aura and without status migrainosus, not intractable   2. Ocular migraine      Patient Instructions  Continue with Sumatriptan  as needed for the headaches  Continue your other medications  Continue to follow up with PCP  Return as needed   No orders of the defined types were placed in this encounter.   Meds ordered this encounter  Medications   SUMAtriptan  (IMITREX ) 100 MG  tablet    Sig: Take 1 tablet (100 mg total) by mouth as directed.    Dispense:  10  tablet    Refill:  1    Return in about 1 year (around 07/01/2025).    Pastor Falling, MD 07/01/2024, 3:15 PM  [facility] [address] [place], [place] [address] [phone]

## 2024-07-01 NOTE — Telephone Encounter (Signed)
 Patient called stating that she has been taking Meloxicam  off/on since her appointment,but everytime she stops taking it the pain comes back. She stated that it was discussed that there may be a possibility to layer that with oral steroids.She is leaving for [place] on Christmas day and will be doing a lot of walking. She would like to know what the best course of action would be.

## 2024-07-01 NOTE — Patient Instructions (Signed)
 Continue with Sumatriptan  as needed for the headaches  Continue your other medications  Continue to follow up with PCP  Return as needed

## 2024-07-17 ENCOUNTER — Other Ambulatory Visit: Payer: Self-pay | Admitting: Podiatry

## 2024-08-21 ENCOUNTER — Other Ambulatory Visit: Payer: Self-pay | Admitting: Internal Medicine

## 2024-08-21 DIAGNOSIS — Z1231 Encounter for screening mammogram for malignant neoplasm of breast: Secondary | ICD-10-CM

## 2024-09-16 ENCOUNTER — Ambulatory Visit

## 2025-06-28 ENCOUNTER — Ambulatory Visit: Admitting: Neurology
